# Patient Record
Sex: Female | Born: 1975 | Race: Black or African American | Hispanic: No | Marital: Single | Smoking: Never smoker
Health system: Southern US, Community
[De-identification: ages and names within clinical notes are randomized; demographics above are authoritative.]

## PROBLEM LIST (undated history)

## (undated) DIAGNOSIS — Z940 Kidney transplant status: Secondary | ICD-10-CM

## (undated) DIAGNOSIS — N12 Tubulo-interstitial nephritis, not specified as acute or chronic: Secondary | ICD-10-CM

## (undated) DIAGNOSIS — M1990 Unspecified osteoarthritis, unspecified site: Secondary | ICD-10-CM

## (undated) DIAGNOSIS — I10 Essential (primary) hypertension: Secondary | ICD-10-CM

## (undated) DIAGNOSIS — E785 Hyperlipidemia, unspecified: Secondary | ICD-10-CM

## (undated) DIAGNOSIS — N19 Unspecified kidney failure: Secondary | ICD-10-CM

## (undated) HISTORY — DX: Hyperlipidemia, unspecified: E78.5

## (undated) HISTORY — PX: PERITONEAL CATHETER INSERTION: SHX2223

## (undated) HISTORY — DX: Essential (primary) hypertension: I10

---

## 1998-06-25 ENCOUNTER — Encounter: Admission: RE | Admit: 1998-06-25 | Discharge: 1998-09-23 | Payer: Self-pay | Admitting: Obstetrics & Gynecology

## 1998-07-17 ENCOUNTER — Ambulatory Visit (HOSPITAL_COMMUNITY): Admission: RE | Admit: 1998-07-17 | Discharge: 1998-07-17 | Payer: Self-pay | Admitting: Obstetrics & Gynecology

## 1998-07-23 ENCOUNTER — Encounter: Admission: RE | Admit: 1998-07-23 | Discharge: 1998-07-23 | Payer: Self-pay | Admitting: Obstetrics & Gynecology

## 1998-07-30 ENCOUNTER — Encounter: Admission: RE | Admit: 1998-07-30 | Discharge: 1998-07-30 | Payer: Self-pay | Admitting: Obstetrics

## 1998-08-06 ENCOUNTER — Encounter: Payer: Self-pay | Admitting: Obstetrics & Gynecology

## 1998-08-06 ENCOUNTER — Encounter: Admission: RE | Admit: 1998-08-06 | Discharge: 1998-08-06 | Payer: Self-pay | Admitting: Obstetrics & Gynecology

## 1998-08-13 ENCOUNTER — Encounter: Admission: RE | Admit: 1998-08-13 | Discharge: 1998-08-13 | Payer: Self-pay | Admitting: Obstetrics & Gynecology

## 1998-08-14 ENCOUNTER — Encounter: Admission: RE | Admit: 1998-08-14 | Discharge: 1998-08-14 | Payer: Self-pay | Admitting: Obstetrics

## 1998-08-14 ENCOUNTER — Ambulatory Visit (HOSPITAL_COMMUNITY): Admission: RE | Admit: 1998-08-14 | Discharge: 1998-08-14 | Payer: Self-pay | Admitting: Obstetrics

## 1998-08-20 ENCOUNTER — Encounter: Admission: RE | Admit: 1998-08-20 | Discharge: 1998-08-20 | Payer: Self-pay | Admitting: Obstetrics & Gynecology

## 1998-08-20 ENCOUNTER — Encounter: Payer: Self-pay | Admitting: Obstetrics

## 1998-08-25 ENCOUNTER — Encounter: Admission: RE | Admit: 1998-08-25 | Discharge: 1998-08-25 | Payer: Self-pay | Admitting: Obstetrics & Gynecology

## 1998-08-27 ENCOUNTER — Encounter: Admission: RE | Admit: 1998-08-27 | Discharge: 1998-08-27 | Payer: Self-pay | Admitting: Obstetrics & Gynecology

## 1998-09-03 ENCOUNTER — Encounter: Admission: RE | Admit: 1998-09-03 | Discharge: 1998-09-03 | Payer: Self-pay | Admitting: Obstetrics & Gynecology

## 1998-09-04 ENCOUNTER — Ambulatory Visit (HOSPITAL_COMMUNITY): Admission: RE | Admit: 1998-09-04 | Discharge: 1998-09-04 | Payer: Self-pay | Admitting: *Deleted

## 1998-09-10 ENCOUNTER — Encounter: Admission: RE | Admit: 1998-09-10 | Discharge: 1998-09-10 | Payer: Self-pay | Admitting: Obstetrics & Gynecology

## 1998-09-17 ENCOUNTER — Encounter: Admission: RE | Admit: 1998-09-17 | Discharge: 1998-09-17 | Payer: Self-pay | Admitting: Obstetrics & Gynecology

## 1998-10-01 ENCOUNTER — Encounter: Admission: RE | Admit: 1998-10-01 | Discharge: 1998-10-01 | Payer: Self-pay | Admitting: Obstetrics & Gynecology

## 1998-10-04 HISTORY — PX: TUBAL LIGATION: SHX77

## 1998-10-15 ENCOUNTER — Ambulatory Visit (HOSPITAL_COMMUNITY): Admission: RE | Admit: 1998-10-15 | Discharge: 1998-10-15 | Payer: Self-pay | Admitting: Obstetrics & Gynecology

## 1998-10-15 ENCOUNTER — Encounter: Admission: RE | Admit: 1998-10-15 | Discharge: 1998-10-15 | Payer: Self-pay | Admitting: Obstetrics & Gynecology

## 1998-10-27 ENCOUNTER — Inpatient Hospital Stay (HOSPITAL_COMMUNITY): Admission: AD | Admit: 1998-10-27 | Discharge: 1998-10-31 | Payer: Self-pay | Admitting: Obstetrics

## 1998-11-03 ENCOUNTER — Encounter (HOSPITAL_COMMUNITY): Admission: RE | Admit: 1998-11-03 | Discharge: 1999-01-15 | Payer: Self-pay | Admitting: Obstetrics & Gynecology

## 1998-11-05 ENCOUNTER — Encounter: Admission: RE | Admit: 1998-11-05 | Discharge: 1998-11-05 | Payer: Self-pay | Admitting: Obstetrics & Gynecology

## 1998-11-19 ENCOUNTER — Encounter: Admission: RE | Admit: 1998-11-19 | Discharge: 1998-11-19 | Payer: Self-pay | Admitting: Obstetrics & Gynecology

## 1998-11-26 ENCOUNTER — Inpatient Hospital Stay (HOSPITAL_COMMUNITY): Admission: AD | Admit: 1998-11-26 | Discharge: 1998-11-26 | Payer: Self-pay | Admitting: Obstetrics

## 1998-11-26 ENCOUNTER — Encounter: Admission: RE | Admit: 1998-11-26 | Discharge: 1998-11-26 | Payer: Self-pay | Admitting: Obstetrics & Gynecology

## 1998-11-26 ENCOUNTER — Encounter: Payer: Self-pay | Admitting: Obstetrics

## 1998-12-03 ENCOUNTER — Inpatient Hospital Stay (HOSPITAL_COMMUNITY): Admission: AD | Admit: 1998-12-03 | Discharge: 1998-12-05 | Payer: Self-pay | Admitting: Obstetrics

## 1998-12-03 ENCOUNTER — Encounter: Payer: Self-pay | Admitting: Obstetrics

## 1998-12-23 ENCOUNTER — Inpatient Hospital Stay (HOSPITAL_COMMUNITY): Admission: AD | Admit: 1998-12-23 | Discharge: 1998-12-23 | Payer: Self-pay | Admitting: *Deleted

## 1998-12-25 ENCOUNTER — Encounter: Payer: Self-pay | Admitting: Obstetrics & Gynecology

## 1999-01-03 ENCOUNTER — Inpatient Hospital Stay (HOSPITAL_COMMUNITY): Admission: AD | Admit: 1999-01-03 | Discharge: 1999-01-03 | Payer: Self-pay | Admitting: Obstetrics

## 1999-01-15 ENCOUNTER — Inpatient Hospital Stay (HOSPITAL_COMMUNITY): Admission: AD | Admit: 1999-01-15 | Discharge: 1999-01-19 | Payer: Self-pay | Admitting: Obstetrics & Gynecology

## 1999-01-20 ENCOUNTER — Encounter (HOSPITAL_COMMUNITY): Admission: RE | Admit: 1999-01-20 | Discharge: 1999-04-20 | Payer: Self-pay | Admitting: *Deleted

## 1999-05-22 ENCOUNTER — Ambulatory Visit (HOSPITAL_COMMUNITY): Admission: RE | Admit: 1999-05-22 | Discharge: 1999-05-23 | Payer: Self-pay | Admitting: Nephrology

## 2001-07-27 ENCOUNTER — Inpatient Hospital Stay (HOSPITAL_COMMUNITY): Admission: AD | Admit: 2001-07-27 | Discharge: 2001-07-30 | Payer: Self-pay | Admitting: Internal Medicine

## 2001-07-27 ENCOUNTER — Encounter: Payer: Self-pay | Admitting: Internal Medicine

## 2001-07-28 ENCOUNTER — Encounter: Payer: Self-pay | Admitting: Internal Medicine

## 2001-08-10 ENCOUNTER — Encounter: Admission: RE | Admit: 2001-08-10 | Discharge: 2001-11-08 | Payer: Self-pay | Admitting: Internal Medicine

## 2003-04-20 ENCOUNTER — Emergency Department (HOSPITAL_COMMUNITY): Admission: EM | Admit: 2003-04-20 | Discharge: 2003-04-20 | Payer: Self-pay | Admitting: Emergency Medicine

## 2003-04-20 ENCOUNTER — Inpatient Hospital Stay (HOSPITAL_COMMUNITY): Admission: AD | Admit: 2003-04-20 | Discharge: 2003-04-22 | Payer: Self-pay | Admitting: Nephrology

## 2003-04-20 ENCOUNTER — Encounter: Payer: Self-pay | Admitting: Emergency Medicine

## 2003-04-21 ENCOUNTER — Encounter: Payer: Self-pay | Admitting: Nephrology

## 2003-06-25 ENCOUNTER — Emergency Department (HOSPITAL_COMMUNITY): Admission: EM | Admit: 2003-06-25 | Discharge: 2003-06-25 | Payer: Self-pay | Admitting: Emergency Medicine

## 2003-06-25 ENCOUNTER — Encounter: Payer: Self-pay | Admitting: Emergency Medicine

## 2003-06-25 ENCOUNTER — Inpatient Hospital Stay (HOSPITAL_COMMUNITY): Admission: EM | Admit: 2003-06-25 | Discharge: 2003-06-28 | Payer: Self-pay | Admitting: Emergency Medicine

## 2003-06-26 ENCOUNTER — Encounter: Payer: Self-pay | Admitting: Family Medicine

## 2003-07-29 ENCOUNTER — Encounter: Payer: Self-pay | Admitting: Internal Medicine

## 2003-07-29 ENCOUNTER — Inpatient Hospital Stay (HOSPITAL_COMMUNITY): Admission: EM | Admit: 2003-07-29 | Discharge: 2003-08-01 | Payer: Self-pay | Admitting: Internal Medicine

## 2003-08-27 ENCOUNTER — Ambulatory Visit (HOSPITAL_COMMUNITY): Admission: RE | Admit: 2003-08-27 | Discharge: 2003-08-27 | Payer: Self-pay | Admitting: Internal Medicine

## 2003-09-01 ENCOUNTER — Inpatient Hospital Stay (HOSPITAL_COMMUNITY): Admission: EM | Admit: 2003-09-01 | Discharge: 2003-09-04 | Payer: Self-pay | Admitting: Emergency Medicine

## 2003-09-16 ENCOUNTER — Ambulatory Visit (HOSPITAL_COMMUNITY): Admission: RE | Admit: 2003-09-16 | Discharge: 2003-09-16 | Payer: Self-pay | Admitting: Internal Medicine

## 2003-11-11 ENCOUNTER — Emergency Department (HOSPITAL_COMMUNITY): Admission: EM | Admit: 2003-11-11 | Discharge: 2003-11-11 | Payer: Self-pay | Admitting: Emergency Medicine

## 2004-01-06 ENCOUNTER — Emergency Department (HOSPITAL_COMMUNITY): Admission: EM | Admit: 2004-01-06 | Discharge: 2004-01-06 | Payer: Self-pay | Admitting: Emergency Medicine

## 2004-01-25 ENCOUNTER — Emergency Department (HOSPITAL_COMMUNITY): Admission: EM | Admit: 2004-01-25 | Discharge: 2004-01-26 | Payer: Self-pay | Admitting: *Deleted

## 2004-04-26 ENCOUNTER — Emergency Department (HOSPITAL_COMMUNITY): Admission: EM | Admit: 2004-04-26 | Discharge: 2004-04-26 | Payer: Self-pay | Admitting: Emergency Medicine

## 2007-10-02 ENCOUNTER — Emergency Department (HOSPITAL_COMMUNITY): Admission: EM | Admit: 2007-10-02 | Discharge: 2007-10-02 | Payer: Self-pay | Admitting: Emergency Medicine

## 2007-10-03 ENCOUNTER — Ambulatory Visit: Payer: Self-pay | Admitting: Internal Medicine

## 2007-10-03 DIAGNOSIS — G609 Hereditary and idiopathic neuropathy, unspecified: Secondary | ICD-10-CM | POA: Insufficient documentation

## 2007-10-03 DIAGNOSIS — E1122 Type 2 diabetes mellitus with diabetic chronic kidney disease: Secondary | ICD-10-CM | POA: Insufficient documentation

## 2007-10-03 DIAGNOSIS — I10 Essential (primary) hypertension: Secondary | ICD-10-CM | POA: Insufficient documentation

## 2007-10-03 DIAGNOSIS — N039 Chronic nephritic syndrome with unspecified morphologic changes: Secondary | ICD-10-CM

## 2007-10-03 DIAGNOSIS — E119 Type 2 diabetes mellitus without complications: Secondary | ICD-10-CM

## 2007-10-03 DIAGNOSIS — D631 Anemia in chronic kidney disease: Secondary | ICD-10-CM | POA: Insufficient documentation

## 2007-10-03 DIAGNOSIS — R110 Nausea: Secondary | ICD-10-CM | POA: Insufficient documentation

## 2007-10-03 DIAGNOSIS — K279 Peptic ulcer, site unspecified, unspecified as acute or chronic, without hemorrhage or perforation: Secondary | ICD-10-CM | POA: Insufficient documentation

## 2007-10-03 DIAGNOSIS — K219 Gastro-esophageal reflux disease without esophagitis: Secondary | ICD-10-CM | POA: Insufficient documentation

## 2007-11-01 ENCOUNTER — Ambulatory Visit: Payer: Self-pay | Admitting: Internal Medicine

## 2007-11-01 DIAGNOSIS — R112 Nausea with vomiting, unspecified: Secondary | ICD-10-CM | POA: Insufficient documentation

## 2007-11-01 LAB — CONVERTED CEMR LAB
Albumin: 2.2 g/dL — ABNORMAL LOW (ref 3.5–5.2)
BUN: 18 mg/dL (ref 6–23)
CO2: 29 meq/L (ref 19–32)
Calcium: 8.1 mg/dL — ABNORMAL LOW (ref 8.4–10.5)
Chloride: 91 meq/L — ABNORMAL LOW (ref 96–112)
Glucose, Bld: 574 mg/dL (ref 70–99)
Lymphocytes Relative: 24 % (ref 12–46)
Lymphs Abs: 1.7 10*3/uL (ref 0.7–4.0)
MCV: 95.2 fL (ref 78.0–100.0)
Monocytes Relative: 8 % (ref 3–12)
Neutrophils Relative %: 65 % (ref 43–77)
Platelets: 380 10*3/uL (ref 150–400)
Potassium: 4.1 meq/L (ref 3.5–5.3)
RBC: 3.03 M/uL — ABNORMAL LOW (ref 3.87–5.11)
Sodium: 128 meq/L — ABNORMAL LOW (ref 135–145)
Total Protein: 5.4 g/dL — ABNORMAL LOW (ref 6.0–8.3)
WBC: 6.9 10*3/uL (ref 4.0–10.5)

## 2007-11-08 ENCOUNTER — Telehealth (INDEPENDENT_AMBULATORY_CARE_PROVIDER_SITE_OTHER): Payer: Self-pay | Admitting: Internal Medicine

## 2007-11-22 ENCOUNTER — Ambulatory Visit: Payer: Self-pay | Admitting: Endocrinology

## 2007-11-22 DIAGNOSIS — R252 Cramp and spasm: Secondary | ICD-10-CM | POA: Insufficient documentation

## 2007-11-22 DIAGNOSIS — K3184 Gastroparesis: Secondary | ICD-10-CM | POA: Insufficient documentation

## 2007-11-23 ENCOUNTER — Encounter: Payer: Self-pay | Admitting: Endocrinology

## 2007-12-13 ENCOUNTER — Ambulatory Visit: Payer: Self-pay | Admitting: Endocrinology

## 2007-12-26 ENCOUNTER — Ambulatory Visit: Payer: Self-pay | Admitting: Internal Medicine

## 2007-12-26 DIAGNOSIS — L259 Unspecified contact dermatitis, unspecified cause: Secondary | ICD-10-CM | POA: Insufficient documentation

## 2008-01-10 ENCOUNTER — Encounter: Payer: Self-pay | Admitting: Endocrinology

## 2008-01-12 ENCOUNTER — Emergency Department (HOSPITAL_COMMUNITY): Admission: EM | Admit: 2008-01-12 | Discharge: 2008-01-12 | Payer: Self-pay | Admitting: Emergency Medicine

## 2008-01-20 ENCOUNTER — Inpatient Hospital Stay (HOSPITAL_COMMUNITY): Admission: EM | Admit: 2008-01-20 | Discharge: 2008-01-23 | Payer: Self-pay | Admitting: Emergency Medicine

## 2008-02-02 ENCOUNTER — Other Ambulatory Visit: Admission: RE | Admit: 2008-02-02 | Discharge: 2008-02-02 | Payer: Self-pay | Admitting: Obstetrics and Gynecology

## 2008-03-30 ENCOUNTER — Inpatient Hospital Stay (HOSPITAL_COMMUNITY): Admission: EM | Admit: 2008-03-30 | Discharge: 2008-04-02 | Payer: Self-pay | Admitting: Emergency Medicine

## 2008-06-03 ENCOUNTER — Ambulatory Visit: Payer: Self-pay | Admitting: Internal Medicine

## 2008-06-03 DIAGNOSIS — L089 Local infection of the skin and subcutaneous tissue, unspecified: Secondary | ICD-10-CM | POA: Insufficient documentation

## 2008-07-10 ENCOUNTER — Encounter: Payer: Self-pay | Admitting: Pulmonary Disease

## 2008-07-10 ENCOUNTER — Encounter: Payer: Self-pay | Admitting: Family Medicine

## 2008-07-25 ENCOUNTER — Ambulatory Visit: Payer: Self-pay | Admitting: Internal Medicine

## 2008-07-25 DIAGNOSIS — J209 Acute bronchitis, unspecified: Secondary | ICD-10-CM | POA: Insufficient documentation

## 2008-08-13 ENCOUNTER — Inpatient Hospital Stay (HOSPITAL_COMMUNITY): Admission: EM | Admit: 2008-08-13 | Discharge: 2008-08-14 | Payer: Self-pay | Admitting: Emergency Medicine

## 2008-08-21 ENCOUNTER — Ambulatory Visit: Payer: Self-pay | Admitting: Pulmonary Disease

## 2008-08-21 DIAGNOSIS — N186 End stage renal disease: Secondary | ICD-10-CM | POA: Insufficient documentation

## 2008-08-21 DIAGNOSIS — R0602 Shortness of breath: Secondary | ICD-10-CM | POA: Insufficient documentation

## 2008-09-25 ENCOUNTER — Telehealth (INDEPENDENT_AMBULATORY_CARE_PROVIDER_SITE_OTHER): Payer: Self-pay | Admitting: *Deleted

## 2008-10-08 ENCOUNTER — Ambulatory Visit: Payer: Self-pay | Admitting: Vascular Surgery

## 2008-10-09 ENCOUNTER — Ambulatory Visit: Payer: Self-pay | Admitting: Internal Medicine

## 2008-10-09 ENCOUNTER — Encounter (INDEPENDENT_AMBULATORY_CARE_PROVIDER_SITE_OTHER): Payer: Self-pay | Admitting: Internal Medicine

## 2008-10-09 ENCOUNTER — Encounter: Payer: Self-pay | Admitting: Internal Medicine

## 2008-10-09 DIAGNOSIS — G4700 Insomnia, unspecified: Secondary | ICD-10-CM | POA: Insufficient documentation

## 2008-10-09 LAB — CONVERTED CEMR LAB
Blood Glucose, Fingerstick: 356
Hgb A1c MFr Bld: 9.5 %

## 2008-10-15 ENCOUNTER — Telehealth (INDEPENDENT_AMBULATORY_CARE_PROVIDER_SITE_OTHER): Payer: Self-pay | Admitting: Internal Medicine

## 2008-11-13 ENCOUNTER — Ambulatory Visit: Payer: Self-pay | Admitting: Internal Medicine

## 2008-11-15 ENCOUNTER — Telehealth (INDEPENDENT_AMBULATORY_CARE_PROVIDER_SITE_OTHER): Payer: Self-pay | Admitting: Internal Medicine

## 2008-11-29 ENCOUNTER — Ambulatory Visit: Payer: Self-pay | Admitting: Internal Medicine

## 2008-11-29 LAB — CONVERTED CEMR LAB: Blood Glucose, Fingerstick: 187

## 2008-12-11 ENCOUNTER — Telehealth (INDEPENDENT_AMBULATORY_CARE_PROVIDER_SITE_OTHER): Payer: Self-pay | Admitting: Internal Medicine

## 2008-12-17 ENCOUNTER — Encounter (INDEPENDENT_AMBULATORY_CARE_PROVIDER_SITE_OTHER): Payer: Self-pay | Admitting: Internal Medicine

## 2009-01-08 ENCOUNTER — Encounter (INDEPENDENT_AMBULATORY_CARE_PROVIDER_SITE_OTHER): Payer: Self-pay | Admitting: Internal Medicine

## 2009-01-16 ENCOUNTER — Ambulatory Visit: Payer: Self-pay | Admitting: Internal Medicine

## 2009-01-16 DIAGNOSIS — R142 Eructation: Secondary | ICD-10-CM

## 2009-01-16 DIAGNOSIS — J309 Allergic rhinitis, unspecified: Secondary | ICD-10-CM | POA: Insufficient documentation

## 2009-01-16 DIAGNOSIS — R141 Gas pain: Secondary | ICD-10-CM | POA: Insufficient documentation

## 2009-01-16 DIAGNOSIS — R143 Flatulence: Secondary | ICD-10-CM

## 2009-01-24 ENCOUNTER — Ambulatory Visit: Payer: Self-pay | Admitting: Gastroenterology

## 2009-01-24 DIAGNOSIS — R197 Diarrhea, unspecified: Secondary | ICD-10-CM | POA: Insufficient documentation

## 2009-01-27 ENCOUNTER — Encounter: Payer: Self-pay | Admitting: Gastroenterology

## 2009-01-27 ENCOUNTER — Telehealth (INDEPENDENT_AMBULATORY_CARE_PROVIDER_SITE_OTHER): Payer: Self-pay | Admitting: Internal Medicine

## 2009-01-27 DIAGNOSIS — K3189 Other diseases of stomach and duodenum: Secondary | ICD-10-CM | POA: Insufficient documentation

## 2009-01-27 DIAGNOSIS — R1013 Epigastric pain: Secondary | ICD-10-CM

## 2009-01-28 ENCOUNTER — Encounter: Payer: Self-pay | Admitting: Gastroenterology

## 2009-02-03 ENCOUNTER — Encounter: Payer: Self-pay | Admitting: Gastroenterology

## 2009-02-03 ENCOUNTER — Ambulatory Visit (HOSPITAL_COMMUNITY): Admission: RE | Admit: 2009-02-03 | Discharge: 2009-02-03 | Payer: Self-pay | Admitting: Gastroenterology

## 2009-02-03 ENCOUNTER — Ambulatory Visit: Payer: Self-pay | Admitting: Gastroenterology

## 2009-02-06 ENCOUNTER — Encounter: Payer: Self-pay | Admitting: Gastroenterology

## 2009-02-10 ENCOUNTER — Ambulatory Visit (HOSPITAL_COMMUNITY): Admission: RE | Admit: 2009-02-10 | Discharge: 2009-02-10 | Payer: Self-pay | Admitting: Gastroenterology

## 2009-02-10 ENCOUNTER — Ambulatory Visit: Payer: Self-pay | Admitting: Gastroenterology

## 2009-02-21 ENCOUNTER — Encounter: Payer: Self-pay | Admitting: Gastroenterology

## 2009-03-07 ENCOUNTER — Other Ambulatory Visit: Admission: RE | Admit: 2009-03-07 | Discharge: 2009-03-07 | Payer: Self-pay | Admitting: Obstetrics & Gynecology

## 2009-03-14 ENCOUNTER — Telehealth (INDEPENDENT_AMBULATORY_CARE_PROVIDER_SITE_OTHER): Payer: Self-pay

## 2009-03-14 ENCOUNTER — Telehealth: Payer: Self-pay | Admitting: Gastroenterology

## 2009-03-24 ENCOUNTER — Ambulatory Visit: Payer: Self-pay | Admitting: Internal Medicine

## 2009-04-14 ENCOUNTER — Ambulatory Visit: Payer: Self-pay | Admitting: Internal Medicine

## 2009-04-14 DIAGNOSIS — L0231 Cutaneous abscess of buttock: Secondary | ICD-10-CM | POA: Insufficient documentation

## 2009-04-14 DIAGNOSIS — L03317 Cellulitis of buttock: Secondary | ICD-10-CM

## 2009-04-15 ENCOUNTER — Ambulatory Visit: Payer: Self-pay | Admitting: Internal Medicine

## 2009-04-15 ENCOUNTER — Telehealth (INDEPENDENT_AMBULATORY_CARE_PROVIDER_SITE_OTHER): Payer: Self-pay | Admitting: Internal Medicine

## 2009-04-16 ENCOUNTER — Inpatient Hospital Stay (HOSPITAL_COMMUNITY): Admission: EM | Admit: 2009-04-16 | Discharge: 2009-04-26 | Payer: Self-pay | Admitting: Emergency Medicine

## 2009-04-17 ENCOUNTER — Ambulatory Visit: Payer: Self-pay | Admitting: Pulmonary Disease

## 2009-04-17 ENCOUNTER — Encounter (INDEPENDENT_AMBULATORY_CARE_PROVIDER_SITE_OTHER): Payer: Self-pay | Admitting: Nephrology

## 2009-04-21 ENCOUNTER — Encounter: Payer: Self-pay | Admitting: Gastroenterology

## 2009-04-30 ENCOUNTER — Encounter (INDEPENDENT_AMBULATORY_CARE_PROVIDER_SITE_OTHER): Payer: Self-pay | Admitting: Internal Medicine

## 2009-05-01 ENCOUNTER — Ambulatory Visit: Payer: Self-pay | Admitting: *Deleted

## 2009-05-04 HISTORY — PX: OTHER SURGICAL HISTORY: SHX169

## 2009-05-08 ENCOUNTER — Ambulatory Visit (HOSPITAL_COMMUNITY): Admission: RE | Admit: 2009-05-08 | Discharge: 2009-05-08 | Payer: Self-pay | Admitting: Vascular Surgery

## 2009-05-08 ENCOUNTER — Ambulatory Visit: Payer: Self-pay | Admitting: Vascular Surgery

## 2009-05-13 ENCOUNTER — Telehealth (INDEPENDENT_AMBULATORY_CARE_PROVIDER_SITE_OTHER): Payer: Self-pay | Admitting: Internal Medicine

## 2009-05-15 ENCOUNTER — Ambulatory Visit (HOSPITAL_COMMUNITY): Admission: RE | Admit: 2009-05-15 | Discharge: 2009-05-15 | Payer: Self-pay | Admitting: General Surgery

## 2009-05-16 ENCOUNTER — Encounter (INDEPENDENT_AMBULATORY_CARE_PROVIDER_SITE_OTHER): Payer: Self-pay | Admitting: Internal Medicine

## 2009-05-21 ENCOUNTER — Ambulatory Visit: Payer: Self-pay | Admitting: Internal Medicine

## 2009-05-29 ENCOUNTER — Ambulatory Visit: Payer: Self-pay | Admitting: Vascular Surgery

## 2009-06-24 ENCOUNTER — Emergency Department (HOSPITAL_COMMUNITY): Admission: EM | Admit: 2009-06-24 | Discharge: 2009-06-24 | Payer: Self-pay | Admitting: Emergency Medicine

## 2009-06-24 ENCOUNTER — Ambulatory Visit (HOSPITAL_COMMUNITY): Admission: RE | Admit: 2009-06-24 | Discharge: 2009-06-24 | Payer: Self-pay | Admitting: Nephrology

## 2009-08-26 ENCOUNTER — Inpatient Hospital Stay (HOSPITAL_COMMUNITY): Admission: EM | Admit: 2009-08-26 | Discharge: 2009-08-27 | Payer: Self-pay | Admitting: Emergency Medicine

## 2009-08-31 ENCOUNTER — Inpatient Hospital Stay (HOSPITAL_COMMUNITY): Admission: EM | Admit: 2009-08-31 | Discharge: 2009-09-04 | Payer: Self-pay | Admitting: Emergency Medicine

## 2009-09-01 ENCOUNTER — Encounter (INDEPENDENT_AMBULATORY_CARE_PROVIDER_SITE_OTHER): Payer: Self-pay | Admitting: Internal Medicine

## 2009-09-18 ENCOUNTER — Observation Stay (HOSPITAL_COMMUNITY): Admission: EM | Admit: 2009-09-18 | Discharge: 2009-09-19 | Payer: Self-pay | Admitting: Emergency Medicine

## 2009-09-24 ENCOUNTER — Encounter: Payer: Self-pay | Admitting: Gastroenterology

## 2009-10-08 ENCOUNTER — Ambulatory Visit: Payer: Self-pay | Admitting: Gastroenterology

## 2009-10-08 DIAGNOSIS — K921 Melena: Secondary | ICD-10-CM | POA: Insufficient documentation

## 2009-10-10 ENCOUNTER — Encounter (INDEPENDENT_AMBULATORY_CARE_PROVIDER_SITE_OTHER): Payer: Self-pay

## 2009-10-19 ENCOUNTER — Inpatient Hospital Stay (HOSPITAL_COMMUNITY): Admission: EM | Admit: 2009-10-19 | Discharge: 2009-10-21 | Payer: Self-pay | Admitting: Emergency Medicine

## 2009-10-20 ENCOUNTER — Encounter: Payer: Self-pay | Admitting: Gastroenterology

## 2009-10-20 ENCOUNTER — Encounter (INDEPENDENT_AMBULATORY_CARE_PROVIDER_SITE_OTHER): Payer: Self-pay | Admitting: Internal Medicine

## 2009-11-03 ENCOUNTER — Ambulatory Visit (HOSPITAL_COMMUNITY): Admission: RE | Admit: 2009-11-03 | Discharge: 2009-11-03 | Payer: Self-pay | Admitting: Obstetrics and Gynecology

## 2009-11-18 ENCOUNTER — Ambulatory Visit: Payer: Self-pay | Admitting: Gastroenterology

## 2009-11-18 DIAGNOSIS — R188 Other ascites: Secondary | ICD-10-CM | POA: Insufficient documentation

## 2009-11-19 ENCOUNTER — Encounter: Payer: Self-pay | Admitting: Gastroenterology

## 2009-11-19 ENCOUNTER — Encounter (HOSPITAL_COMMUNITY): Admission: RE | Admit: 2009-11-19 | Discharge: 2009-12-19 | Payer: Self-pay | Admitting: Gastroenterology

## 2009-12-03 ENCOUNTER — Ambulatory Visit (HOSPITAL_COMMUNITY): Admission: RE | Admit: 2009-12-03 | Discharge: 2009-12-03 | Payer: Self-pay | Admitting: Nephrology

## 2009-12-04 ENCOUNTER — Ambulatory Visit: Admission: RE | Admit: 2009-12-04 | Discharge: 2009-12-04 | Payer: Self-pay | Admitting: Gynecologic Oncology

## 2009-12-05 ENCOUNTER — Ambulatory Visit (HOSPITAL_COMMUNITY): Admission: RE | Admit: 2009-12-05 | Discharge: 2009-12-05 | Payer: Self-pay | Admitting: Vascular Surgery

## 2009-12-05 ENCOUNTER — Ambulatory Visit: Payer: Self-pay | Admitting: Vascular Surgery

## 2010-01-19 ENCOUNTER — Inpatient Hospital Stay (HOSPITAL_COMMUNITY): Admission: EM | Admit: 2010-01-19 | Discharge: 2010-01-22 | Payer: Self-pay | Admitting: Emergency Medicine

## 2010-01-20 ENCOUNTER — Encounter (INDEPENDENT_AMBULATORY_CARE_PROVIDER_SITE_OTHER): Payer: Self-pay | Admitting: Internal Medicine

## 2010-01-21 ENCOUNTER — Encounter (INDEPENDENT_AMBULATORY_CARE_PROVIDER_SITE_OTHER): Payer: Self-pay | Admitting: Internal Medicine

## 2010-01-21 ENCOUNTER — Ambulatory Visit: Payer: Self-pay | Admitting: Vascular Surgery

## 2010-02-03 ENCOUNTER — Ambulatory Visit (HOSPITAL_COMMUNITY)
Admission: RE | Admit: 2010-02-03 | Discharge: 2010-02-03 | Payer: Self-pay | Source: Home / Self Care | Admitting: *Deleted

## 2010-03-04 HISTORY — PX: KIDNEY TRANSPLANT: SHX239

## 2010-03-10 ENCOUNTER — Encounter (INDEPENDENT_AMBULATORY_CARE_PROVIDER_SITE_OTHER): Payer: Self-pay | Admitting: Surgery

## 2010-03-10 ENCOUNTER — Ambulatory Visit (HOSPITAL_COMMUNITY): Admission: RE | Admit: 2010-03-10 | Discharge: 2010-03-11 | Payer: Self-pay | Admitting: Surgery

## 2010-03-27 ENCOUNTER — Ambulatory Visit (HOSPITAL_COMMUNITY): Admission: RE | Admit: 2010-03-27 | Discharge: 2010-03-27 | Payer: Self-pay | Admitting: Nephrology

## 2010-03-30 ENCOUNTER — Encounter (INDEPENDENT_AMBULATORY_CARE_PROVIDER_SITE_OTHER): Payer: Self-pay | Admitting: *Deleted

## 2010-03-30 DIAGNOSIS — K659 Peritonitis, unspecified: Secondary | ICD-10-CM | POA: Insufficient documentation

## 2010-03-30 DIAGNOSIS — E669 Obesity, unspecified: Secondary | ICD-10-CM

## 2010-03-30 DIAGNOSIS — E6609 Other obesity due to excess calories: Secondary | ICD-10-CM | POA: Insufficient documentation

## 2010-05-07 ENCOUNTER — Encounter (INDEPENDENT_AMBULATORY_CARE_PROVIDER_SITE_OTHER): Payer: Self-pay | Admitting: *Deleted

## 2010-10-24 ENCOUNTER — Encounter: Payer: Self-pay | Admitting: Internal Medicine

## 2010-10-25 ENCOUNTER — Encounter: Payer: Self-pay | Admitting: Family Medicine

## 2010-10-25 ENCOUNTER — Encounter: Payer: Self-pay | Admitting: Nephrology

## 2010-10-26 ENCOUNTER — Encounter: Payer: Self-pay | Admitting: Internal Medicine

## 2010-11-03 NOTE — Letter (Signed)
Summary: GES ORDER  GES ORDER   Imported By: Sofie Rower 11/18/2009 15:33:47  _____________________________________________________________________  External Attachment:    Type:   Image     Comment:   External Document

## 2010-11-03 NOTE — Miscellaneous (Signed)
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 Edrick Oh Added new problem of OBESITY (ICD-278.00)

## 2010-11-03 NOTE — Letter (Signed)
Summary: Recall Office Visit  [facility]  [address]   [place], [place] [address]   Phone: [phone]  Fax: [phone]      May 07, 2010   [name] [address] [place], [place]  [address] 1976/02/05   Dear Ms. BLACKWELL,   According to our records, it is time for you to schedule a follow-up office visit with [place].   At your convenience, please call [phone] to schedule an office visit. If you have any questions, concerns, or feel that this letter is in error, we would appreciate your call.   Sincerely,    [facility] Ph: [phone]   Fax: [phone]

## 2010-11-03 NOTE — Letter (Signed)
Summary: [facility] CONFIRMATION  [facility] CON[facility]IRM[facility]   Imported By: Sofie Rower 11/19/2009 15:14:21  _____________________________________________________________________  External Attachment:    Type:   Image     Comment:   External Document

## 2010-11-03 NOTE — Assessment & Plan Note (Signed)
Summary: ASCITES/GASTROPARESIS   Visit Type:  Follow-up Visit Primary Care Provider:  NONE  Chief Complaint:  ascites.  History of Present Illness: Started HD in July due to low albumin pt not a candidate for PD. SEP 2010: PD catheter in but then took it out.  Wants to know if she can go for 2nd opinion for evaluation of recurrent ascites, BLOATING, NAUSEA, and vomiting. Eats small portions and it all comes up or she feels miserable. Last time fluid 500 ML pulled off.  Low abumin to low for PD. HD: MWF.  Nausea: had a spell Fri. Vomiting and diarrhea and got better yesterday. Today first day kept things down. Stomach feels tight a bloated. Dry weight 77.5 kgs. Up Reglan to 4x/day and had diarrhea. Now on two times a day and better.   Current Medications (verified): 1)  Lantus 100 Unit/ml  Soln (Insulin Glargine) .Marland Kitchen.. 10 Units Subcutaneously At Bedtime 2)  Insulin Syringe 31g X 5/16" 0.3 Ml Misc (Insulin Syringe-Needle U-100) .... Use Daily With Lantus 3)  Insulin Pen Needles, Any Brand, Short, Needle .... Tid 4)  Bd Insulin Syringe Ultrafine 31g X 5/16" 0.3 Ml Misc (Insulin Syringe-Needle U-100) .... Use As Directed Follow-Up Appt Is Due 5)  Freestyle Test  Strp (Glucose Blood) .... Use As Directed. Checking Sugars Twice Daily Dx:250.0 6)  Epogen 10000 Unit/ml Soln (Epoetin Alfa) .... Three Times Weekly With Dialysis 7)  Lancets 30g  Misc (Lancets) .... Use As Directed Three Times A Day 8)  Reglan 5 Mg Tabs (Metoclopramide Hcl) .... Two Times A Day 9)  Nephro-Vite .... Take 1 Tablet By Mouth Once A Day 10)  Dexilant 60 Mg Cpdr (Dexlansoprazole) .... Take 1 Tablet By Mouth Once A Day  Allergies (verified): No Known Drug Allergies  Past History:  Past Surgical History: Last updated: 10/08/2009 Caesarean section-12/1994 Tubal ligation-01/1999 peritoneal dialysis cath-01/2002 Left arm graft 8/10 PD cath removal 8/10 I+D left perineum necrotizing infection, 7/10   Family History: Last  updated: 10/08/2009 father-69-HTN mother-58-diabetes sister-37 sister-42 daughter-12-DM son-8 No FH of Colon Cancer or polyps. Family History of Inflammatory Bowel Disease:sister, crohns  Social History: Last updated: 10/08/2009 Single lives with parents and 2 living children, one son deceased at age 53 month, heart defect disabled Never Smoked Alcohol use-no Drug use-no  Past Medical History: Anemia of CKD GERD Hypertension, none since on hemodialysis Peripheral neuropathy Peptic ulcer disease, h/o End Stage Renal Disease--previously on peritoneal dialysis, hemodialysis started 7/10, on transplant list at [facility] Diabetes mellitus, type II since age 11 insomina Allergic rhinitis recurrent diarrhea--small bowel bacterial overgrowth Colonoscopy in [place], normal, 3 years EGD 5/10, Dr. Barney Drain, normal appearing, sb bx - chronic peptic duodenitis TCS/EGD 2008-GASTRIC bX NEG FOR h. PYLORI, NO MICROSCOPIC COLITIS EGD 2006: bX NEG FOR h. PYLORI GASTRITIS Ascites requiring two abd paracentesis in 11/10.  W/U most c/w exudate, treated empirically for peritonitis. *****WORKUP FOR ASCITES 2010-2011: JAN 2011-CR 9.93 BODY FLUID ANALYSIS: bfALB 1.8 TRIG 33   PROTEIN 4.1 CYTOLOGY NEG CELLCOUNT 420 Neu 36% Monos 23% Amylase 17; serum ALBUMIN: 2.5 SAAG-0.7 ALK PHOS 93 AST 9 ALT 9 T BILI 0.4---PELVIC U/S: COMPLEX LEFT OVARIAN CYST; CT A/P W/ IVC: ascites, nl liver, spleen, pancreas NOV 2010- BODY FLUID ANALYSIS: bfALB 1.9 SERUM ALB 2.4 SAAG 0.6,: CYTOLOGY NEG, INR 1.27 HBsAG NEG HCV AB NEG CA 19-9 8.9 CA-125 8.8---ECHO: NL LV, EF 55-60%, NL IVC, CHAMBERS X4, AND VALVES x4, NL PULM ARTERY PRESSURE Ascites accumulated after converting from peritoneal dialysis  to hemodialysis.  Review of Systems       Per HPI, otherwise all systems negative  AUG 2010: 212 LBS     Vital Signs:  Patient profile:   35 year old female Height:      65 inches Weight:      174.50 pounds BMI:      29.14 Temp:     99.0 degrees F oral Pulse rate:   56 / minute BP sitting:   120 / 70  (right arm) Cuff size:   regular  Vitals Entered By: Waldon Merl LPN (February 15, 624THL 2:42 PM)  Physical Exam  General:  Well developed, well nourished, no acute distress. Head:  Normocephalic and atraumatic. Eyes:  PERRLA, no icterus. Mouth:  No deformity or lesions. Neck:  Supple; no masses. Lungs:  Clear throughout to auscultation. Heart:  Regular rate and rhythm; no murmurs Abdomen:  Soft. Normal bowel sounds. Mild TTP in all 4 quadrants Mildly distended.striated, without guarding, and without rebound.    Impression & Recommendations:  Problem # 1:  ABDOMINAL BLOATING (ICD-787.3) NAUSEA AND VOMTIING LIKELY 2O TO GASTROPARESIS/?ascites. Pt cannot tolerate increased dose of Reglan. Wants referral for 2nd opinion. Referred to [facility]. G[facility] ASAP. OPV in 6 mos: E:30 visit. Strictly adhere to diabetic/dastroparesis diet.  Orders: Est. Patient Level V [id number]  ADDENDUM 09811: [facility] ON Regaln BID,  Nov 19, 2009-100% RETENTION AT 60 MINUTES  Problem # 2:  OTHER ASCITES (ICD-789.59) Pt has had an extensive GI W/U for ascites. SAAG  0.7 suggests pt does not have portal HTN. Most likely 2o to ESRD and low albumin. Explained to pt if she needs a second opinion, would see a Nephrologist. Continue HD and explained that as albumin increase then she will have  most likely have more affective control of her ascites.   TIME SPENT WITH PATIENT AND REVIEWING RECORDS TO 2004: 1 HOUR  CC: PCP    Appended Document: ASCITES/GASTROPARESIS DEC 2010: ALB 3.3 T PROT 7.1 HB 9.7-->7.7 CR 9.2  ALK PHOS 88 AST 8   TIBC 169 L, HEME NEGX3  OCT 2010: FERRITIN 272

## 2010-11-03 NOTE — Miscellaneous (Signed)
Summary: Orders Update  Clinical Lists Changes  Orders: Added new Test order of T-Hemoglobin and Hematocrit (1005) - Signed  Appended Document: Orders Update H/H from inpatient sufficient. No need to repeat for now.  Keep OV with [facility] as planned.  Appended Document: Orders Update pt aware

## 2010-11-03 NOTE — Letter (Signed)
Summary: [facility]ERRAL  [facility]ERRAL   Imported By: Sofie Rower 11/18/2009 16:16:40  _____________________________________________________________________  External Attachment:    Type:   Image     Comment:   External Document

## 2010-11-03 NOTE — Assessment & Plan Note (Signed)
Summary: BLOATING- CDG   Visit Type:  f/u Primary Care Provider:  None  Chief Complaint:  bloating, belching, and feels like food sticks in the top of her stomach.  History of Present Illness: [name] is a pleasant 35 year old with history of end-stage renal disease on hemodialysis, diabetes mellitus (since teenager) who presents for further evaluation of chronic abdominal bloating and belching. Her history is also notable for the fact that she underwent peritoneal dialysis for years, and this was stopped around July or August of this past year secondary to hypoalbuminemia. She was converted to hemodialysis. She has also had difficulties with ascites requiring paracentesis at least 2 occasions for a total of 8-9L of fluid. All cultures have remained negative at this time. She was given antibiotics for possible peritonitis however.  She has had previous episodes of peritonitis in the past several years.  She presents complaining of foul-smelling belching, postprandial vomiting, epigastric fullness and bloating associated with meals. Symptoms have been recurrent in the last couple of months. She was initially started on Reglan 4 times a day after recent hospitalization but this caused diarrhea therefore she has dropped the dose to twice a day. She denies any typical heartburn. She generally has a bowel movement once daily ranging from constipation to loose stools. Has a history of melena. She had Hemoccult-positive stool during her last hospitalization however. She states that she return in 3 Hemoccult cards this week to [facility]. She is awaiting results. Her last hemoglobin was in the 8 range.  She states she was taken off Actos during hospitalization as it was felt that this may be contributing to fluid overload. Her sugars have been running up into the 150s. She notes her menstrual cycles have been irregular, last one over 2 months ago.  Abdominal ultrasound and Doppler  studies done in November 2010 when she presented with ascites showed gallbladder sludge, no evidence of cirrhosis, no evidence of portal vein occlusion or thrombus,ascites present.     CT of abdomen and pelvis with contrast November 2010 showed no evidence of cirrhosis. Ascitic fluid cultures, gram stain were neg.  Cytology negative. AFB smear and culture prelim negative.  Current Medications (verified): 1)  Lantus 100 Unit/ml  Soln (Insulin Glargine) .Marland Kitchen.. 15 Units Subcutaneously At Bedtime 2)  Insulin Syringe 31g X 5/16" 0.3 Ml Misc (Insulin Syringe-Needle U-100) .... Use Daily With Lantus 3)  Insulin Pen Needles, Any Brand, Short, Needle .... Tid 4)  Kapidex 60 Mg Cpdr (Dexlansoprazole) .... One Tab Once Daily 5)  Bd Insulin Syringe Ultrafine 31g X 5/16" 0.3 Ml Misc (Insulin Syringe-Needle U-100) .... Use As Directed Follow-Up Appt Is Due 6)  Freestyle Test  Strp (Glucose Blood) .... Use As Directed. Checking Sugars Twice Daily Dx:250.0 7)  Epogen 10000 Unit/ml Soln (Epoetin Alfa) .... Three Times Weekly With Dialysis 8)  Lancets 30g  Misc (Lancets) .... Use As Directed Three Times A Day 9)  Reglan 5 Mg Tabs (Metoclopramide Hcl) .... Two Times A Day  Allergies (verified): No Known Drug Allergies  Past History:  Past Medical History: Anemia of CKD GERD Hypertension, none since on hemodialysis Peripheral neuropathy Peptic ulcer disease, h/o End Stage Renal Disease--previously on peritoneal dialysis, hemodialysis started 7/10, on transplant list at [facility] Diabetes mellitus, type II since age 68 insomina Allergic rhinitis recurrent diarrhea--small bowel bacterial overgrowth Colonoscopy in [place], normal, 3 years EGD 5/10, Dr. Barney Drain, normal appearing, sb bx - chronic peptic duodenitis Ascites requiring two abd paracentesis in  11/10.  W/U most c/w exudate, treated empirically for peritonitis.  Ascites accumulated after converting from peritoneal dialysis to hemodialysis.  Past  Surgical History: Caesarean section-12/1994 Tubal ligation-01/1999 peritoneal dialysis cath-01/2002 Left arm graft 8/10 PD cath removal 8/10 I+D left perineum necrotizing infection, 7/10   Family History: father-69-HTN mother-58-diabetes sister-37 sister-42 daughter-12-DM son-8 No FH of Colon Cancer or polyps. Family History of Inflammatory Bowel Disease:sister, crohns  Social History: Single lives with parents and 2 living children, one son deceased at age 79 month, heart defect disabled Never Smoked Alcohol use-no Drug use-no  Review of Systems General:  Complains of weight loss; denies fever and weakness; wt 212 8/10 down to 170 now.  s/p large volume paracentesis in 11/10. CV:  Denies chest pains, angina, and palpitations. Resp:  Denies dyspnea at rest, dyspnea with exercise, and cough. GI:  Complains of nausea, vomiting, abdominal pain, and gas/bloating; denies difficulty swallowing, pain on swallowing, indigestion/heartburn, vomiting blood, jaundice, change in bowel habits, bloody BM's, and black BMs. GU:  irregular menses, none for two months. Heme:  Denies bruising and bleeding.  Vital Signs:  Patient profile:   35 year old female Height:      65 inches Weight:      171 pounds BMI:     28.56 Temp:     98.4 degrees F oral Pulse rate:   92 / minute BP sitting:   110 / 64  (right arm) Cuff size:   regular  Vitals Entered By: Burnadette Peter LPN (January  5, 624THL 2:23 PM)  Physical Exam  General:  Well developed, well nourished, no acute distress. Head:  Normocephalic and atraumatic. Eyes:  sclera nonicteric Mouth:  op moist Lungs:  Clear throughout to auscultation. Heart:  Regular rate and rhythm; no murmurs, rubs,  or bruits. Abdomen:  obese. positive bs. nontender. no abd bruit or hernia. no fluid wave. soft. Extremities:  No clubbing, cyanosis, edema or deformities noted. Neurologic:  Alert and  oriented x4;  grossly normal neurologically. Skin:  Intact  without significant lesions or rashes. Psych:  Alert and cooperative. Normal mood and affect.  Impression & Recommendations:  Problem # 1:  ABDOMINAL BLOATING (ICD-787.3)  Abdominal bloating/fullness, postprandial nausea and vomiting several months duration.  Complicated by h/o ascites, hemoccult positive stool, anemia, DM, gb sludge.  DDX is quite broad.  Need to consider gastroparesis, gallbladder disease, less likely PUD.  No clinical evidence of recurrent ascites to explain symptoms.  She has ongoing anemia and h/o heme positive stool therefore may need repeat EGD/TCS.  Initially I will obtain copy of recent labs and hemoccults done at [facility].  She can continue Reglan two times a day as before.  Consume 5-6 small meals daily.  If symptoms worsen in meantime or she developes recurrent ascites, fever she will let us know.  Encouraged her to continue to look for PCP (difficulty obtaining PCP secondary to no one taking new Medicare patients). Further recommendations to follow.  Orders: Est. Patient Level III [id number])  Appended Document: BLOATING- CDG Retrieved records.  Hgb went from 9.7 on 09/17/09 to 7.7 on 10/01/09. Ferritin 12, alb 3.3, AP 88, AST 8, ALT 6, HbsAg neg, HbsAB >150, HCV negative.  Heme negative X 3.  To discuss with [facility].  Pt has early satiety/bloating, pp vomiting, heartburn, H/O ascites.  Heme positive during recent hospitalization though.  Last EGD 5/10. Last TCS 3 years ago.  Appended Document: BLOATING- CDG Pt needs E: 30 visit with me in  next 2 weeks to discuss Sx.  Appended Document: BLOATING- CDG Let's get an H/H on patient.  One two weeks ago was below 8.  Appended Document: BLOATING- CDG called pt- she is inpt at [facility]  Appended Document: BLOATING- CDG No need for H/H now.  Hgb in upper 8 to low 9 range inpatient.  Keep OV with [facility] as planned.  Appended Document: BLOATING- CDG pt aware  Appended Document: BLOATING- CDG LMOM TO CALL BACK

## 2010-11-03 NOTE — Miscellaneous (Signed)
Summary: procedures prior to 2008  Clinical Lists Changes  Gastric Emptying study  NM Gastric Emptying. - STATUS: Final  IMAGE                                     Perform Date: 13Dec04 09:49  Ordered By: Gala Romney MD , Cristopher Estimable           Ordered Date: 13Dec04 07:09  Facility: [facility]                               Department: [place]  Service Report Text  Accession Number: [id number]    Clinical Data:  Upper abdominal pain.   GASTRIC EMPTYING STUDY   2 mCi Tc-66m sulfur colloid cooked in one egg and administered p. o.   Percent activity remaining in the stomach at 120 minutes is 1.5 %,   normal.  Normal gastric emptying at 120 minutes is less than 30%.   IMPRESSION   Normal gastric emptying study.    Read By:  Lura Em,  M.D.   Released By:  Lura Em,  M.D.  Additional Information  External image : [id number]  EGD  NAME:  [name], [name]                      ACCOUNT NO.:  [id number]   MEDICAL RECORD NO.:  [id number]                   PATIENT TYPE:  AMB   LOCATION:  DAY                                  FACILITY:  [facility]   PHYSICIAN:  R. Garfield Cornea, M.D.              DATE OF BIRTH:  [birth year]   DATE OF PROCEDURE:  DATE OF DISCHARGE:                                 OPERATIVE REPORT   PROCEDURE:  Diagnostic esophagogastroduodenoscopy.   ENDOSCOPIST:  Cristopher Estimable. Rourk, M.D.   INDICATIONS FOR PROCEDURE:  The patient is a 35 year old lady with a recent  bout of upper abdominal pain, nausea and vomiting which precipitated  hospitalization.  Abdominal pain has resolved; however, she continues to  have intermittent postprandial nausea and vomiting.  EGD is now being done  to further evaluate her symptoms.  This approach has been discussed with the  patient at length.  The potential risks, benefits, and alternatives have  been reviewed; questions answered.  She is agreeable.  Please see my  August 26, 2003 H&P.   PROCEDURE NOTE:  O2 saturation, blood pressure, pulse  and respirations were  monitored throughout the entire procedure.  Conscious sedation: Versed 3 mg  IV, Demerol 75 mg IV in divided doses.   INSTRUMENT:  Olympus video chip adult gastroscope.   FINDINGS:  Examination of the tubular esophagus revealed no mucosal  abnormalities.  The EG junction was easily traversed.   STOMACH:  The gastric cavity was empty.  It insufflated well with air.  A  thorough examination of the gastric mucosa including a retroflex view of the  proximal stomach  and esophagogastric junction demonstrated a couple of  linear erosions along the greater curvature. The patient had a small hiatal  hernia.  Otherwise the gastric mucosa appeared normal.  The pylorus was  patent and easily traversed.   DUODENUM:  The bulb and the second portion appeared normal.   THERAPEUTIC/DIAGNOSTIC MANEUVERS:  None.   The patient tolerated the procedure well and was reacted in endoscopy.   IMPRESSION:  1. Normal esophagus.  2. Small hiatal hernia and a couple of linear gastric erosions as described     above.  The remainder of the gastric mucosa appeared normal.  3. Normal D1 and D2.   DISCUSSION:  If she continues to have nausea and vomiting we need to be  concerned about an element of gastroparesis, erosions in her stomach may be  related to the trauma of vomiting.   RECOMMENDATIONS:  1. A solid phase gastric emptying study in the near future.  2. We will check H. pylori serologies.  3. Further recommendations to follow.      ___________________________________________                                            Bridgette Habermann, M.D.   RMR/MEDQ  D:  08/27/2003  T:  08/27/2003  Job:  [id number]   cc:   Tammi Sou, M.D.  [facility]  [place], [place] [address]  Fax: [phone]

## 2010-12-15 ENCOUNTER — Emergency Department (HOSPITAL_COMMUNITY)
Admission: EM | Admit: 2010-12-15 | Discharge: 2010-12-15 | Disposition: A | Discharge status: Home / Self Care | Payer: Medicare Other | Attending: Emergency Medicine | Admitting: Emergency Medicine

## 2010-12-15 DIAGNOSIS — N189 Chronic kidney disease, unspecified: Secondary | ICD-10-CM | POA: Insufficient documentation

## 2010-12-15 DIAGNOSIS — Z992 Dependence on renal dialysis: Secondary | ICD-10-CM | POA: Insufficient documentation

## 2010-12-15 DIAGNOSIS — J029 Acute pharyngitis, unspecified: Secondary | ICD-10-CM | POA: Insufficient documentation

## 2010-12-15 DIAGNOSIS — R6883 Chills (without fever): Secondary | ICD-10-CM | POA: Insufficient documentation

## 2010-12-15 DIAGNOSIS — I129 Hypertensive chronic kidney disease with stage 1 through stage 4 chronic kidney disease, or unspecified chronic kidney disease: Secondary | ICD-10-CM | POA: Insufficient documentation

## 2010-12-15 DIAGNOSIS — Z794 Long term (current) use of insulin: Secondary | ICD-10-CM | POA: Insufficient documentation

## 2010-12-15 DIAGNOSIS — H9209 Otalgia, unspecified ear: Secondary | ICD-10-CM | POA: Insufficient documentation

## 2010-12-15 DIAGNOSIS — I10 Essential (primary) hypertension: Secondary | ICD-10-CM | POA: Insufficient documentation

## 2010-12-15 DIAGNOSIS — E119 Type 2 diabetes mellitus without complications: Secondary | ICD-10-CM | POA: Insufficient documentation

## 2010-12-20 LAB — MAGNESIUM
Magnesium: 1.9 mg/dL (ref 1.5–2.5)
Magnesium: 2 mg/dL (ref 1.5–2.5)

## 2010-12-20 LAB — BASIC METABOLIC PANEL
BUN: 11 mg/dL (ref 6–23)
CO2: 30 mEq/L (ref 19–32)
Calcium: 7.8 mg/dL — ABNORMAL LOW (ref 8.4–10.5)
Creatinine, Ser: 5.5 mg/dL — ABNORMAL HIGH (ref 0.4–1.2)
Glucose, Bld: 97 mg/dL (ref 70–99)

## 2010-12-20 LAB — DIFFERENTIAL
Eosinophils Absolute: 0.1 10*3/uL (ref 0.0–0.7)
Eosinophils Relative: 1 % (ref 0–5)
Lymphocytes Relative: 19 % (ref 12–46)
Lymphs Abs: 1.7 10*3/uL (ref 0.7–4.0)
Monocytes Absolute: 1 10*3/uL (ref 0.1–1.0)
Monocytes Relative: 12 % (ref 3–12)

## 2010-12-20 LAB — BODY FLUID CELL COUNT WITH DIFFERENTIAL: Total Nucleated Cell Count, Fluid: 420 cu mm (ref 0–1000)

## 2010-12-20 LAB — LIPASE, BLOOD: Lipase: 11 U/L (ref 11–59)

## 2010-12-20 LAB — CBC
HCT: 28.3 % — ABNORMAL LOW (ref 36.0–46.0)
Hemoglobin: 8.9 g/dL — ABNORMAL LOW (ref 12.0–15.0)
Hemoglobin: 9.1 g/dL — ABNORMAL LOW (ref 12.0–15.0)
MCHC: 31.6 g/dL (ref 30.0–36.0)
MCHC: 31.8 g/dL (ref 30.0–36.0)
MCHC: 32 g/dL (ref 30.0–36.0)
MCHC: 32.3 g/dL (ref 30.0–36.0)
MCV: 90.4 fL (ref 78.0–100.0)
Platelets: 243 10*3/uL (ref 150–400)
Platelets: 354 10*3/uL (ref 150–400)
Platelets: 372 10*3/uL (ref 150–400)
RBC: 3.1 MIL/uL — ABNORMAL LOW (ref 3.87–5.11)
RBC: 3.62 MIL/uL — ABNORMAL LOW (ref 3.87–5.11)
RDW: 17.5 % — ABNORMAL HIGH (ref 11.5–15.5)
RDW: 17.6 % — ABNORMAL HIGH (ref 11.5–15.5)
WBC: 7.6 10*3/uL (ref 4.0–10.5)
WBC: 8.8 10*3/uL (ref 4.0–10.5)

## 2010-12-20 LAB — COMPREHENSIVE METABOLIC PANEL
ALT: 11 U/L (ref 0–35)
ALT: 9 U/L (ref 0–35)
AST: 13 U/L (ref 0–37)
Albumin: 2.9 g/dL — ABNORMAL LOW (ref 3.5–5.2)
Alkaline Phosphatase: 93 U/L (ref 39–117)
BUN: 27 mg/dL — ABNORMAL HIGH (ref 6–23)
CO2: 28 mEq/L (ref 19–32)
CO2: 31 mEq/L (ref 19–32)
Calcium: 8.1 mg/dL — ABNORMAL LOW (ref 8.4–10.5)
Calcium: 8.6 mg/dL (ref 8.4–10.5)
GFR calc Af Amer: 7 mL/min — ABNORMAL LOW (ref >60)
GFR calc non Af Amer: 5 mL/min — ABNORMAL LOW (ref >60)
GFR calc non Af Amer: 6 mL/min — ABNORMAL LOW (ref >60)
Glucose, Bld: 76 mg/dL (ref 70–99)
Sodium: 136 mEq/L (ref 135–145)
Sodium: 136 mEq/L (ref 135–145)
Total Protein: 6.7 g/dL (ref 6.0–8.3)
Total Protein: 7.8 g/dL (ref 6.0–8.3)

## 2010-12-20 LAB — GLUCOSE, CAPILLARY
Glucose-Capillary: 100 mg/dL — ABNORMAL HIGH (ref 70–99)
Glucose-Capillary: 42 mg/dL — CL (ref 70–99)
Glucose-Capillary: 55 mg/dL — ABNORMAL LOW (ref 70–99)
Glucose-Capillary: 65 mg/dL — ABNORMAL LOW (ref 70–99)
Glucose-Capillary: 67 mg/dL — ABNORMAL LOW (ref 70–99)
Glucose-Capillary: 67 mg/dL — ABNORMAL LOW (ref 70–99)
Glucose-Capillary: 80 mg/dL (ref 70–99)
Glucose-Capillary: 88 mg/dL (ref 70–99)
Glucose-Capillary: 91 mg/dL (ref 70–99)

## 2010-12-20 LAB — RENAL FUNCTION PANEL
Albumin: 2.5 g/dL — ABNORMAL LOW (ref 3.5–5.2)
Calcium: 7.7 mg/dL — ABNORMAL LOW (ref 8.4–10.5)
Creatinine, Ser: 9.93 mg/dL — ABNORMAL HIGH (ref 0.4–1.2)
GFR calc Af Amer: 5 mL/min — ABNORMAL LOW (ref >60)
GFR calc non Af Amer: 4 mL/min — ABNORMAL LOW (ref >60)
Phosphorus: 4.8 mg/dL — ABNORMAL HIGH (ref 2.3–4.6)
Sodium: 132 mEq/L — ABNORMAL LOW (ref 135–145)

## 2010-12-20 LAB — AMYLASE, BODY FLUID: Amylase, Fluid: 17 U/L

## 2010-12-20 LAB — BODY FLUID CULTURE: Culture: NO GROWTH

## 2010-12-20 LAB — TRIGLYCERIDES, BODY FLUIDS: Triglycerides, Fluid: 33 mg/dL

## 2010-12-20 LAB — PHOSPHORUS: Phosphorus: 3.3 mg/dL (ref 2.3–4.6)

## 2010-12-20 LAB — PATHOLOGIST SMEAR REVIEW

## 2010-12-21 LAB — ANAEROBIC CULTURE

## 2010-12-21 LAB — BASIC METABOLIC PANEL
CO2: 31 mEq/L (ref 19–32)
CO2: 33 mEq/L — ABNORMAL HIGH (ref 19–32)
Calcium: 7.9 mg/dL — ABNORMAL LOW (ref 8.4–10.5)
Calcium: 8.8 mg/dL (ref 8.4–10.5)
Chloride: 96 mEq/L (ref 96–112)
Chloride: 99 mEq/L (ref 96–112)
Creatinine, Ser: 7.36 mg/dL — ABNORMAL HIGH (ref 0.4–1.2)
Creatinine, Ser: 8.81 mg/dL — ABNORMAL HIGH (ref 0.4–1.2)
GFR calc Af Amer: 6 mL/min — ABNORMAL LOW (ref >60)
GFR calc Af Amer: 8 mL/min — ABNORMAL LOW (ref >60)
GFR calc Af Amer: 8 mL/min — ABNORMAL LOW (ref >60)
GFR calc non Af Amer: 5 mL/min — ABNORMAL LOW (ref >60)
Sodium: 136 mEq/L (ref 135–145)
Sodium: 139 mEq/L (ref 135–145)

## 2010-12-21 LAB — FUNGUS CULTURE W SMEAR: Fungal Smear: NONE SEEN

## 2010-12-21 LAB — TISSUE CULTURE

## 2010-12-21 LAB — CBC
Hemoglobin: 11.8 g/dL — ABNORMAL LOW (ref 12.0–15.0)
MCHC: 32.1 g/dL (ref 30.0–36.0)
MCV: 93.5 fL (ref 78.0–100.0)
Platelets: 207 10*3/uL (ref 150–400)
RBC: 3.95 MIL/uL (ref 3.87–5.11)
WBC: 5.2 10*3/uL (ref 4.0–10.5)

## 2010-12-21 LAB — AFB CULTURE WITH SMEAR (NOT AT ARMC): Acid Fast Smear: NONE SEEN

## 2010-12-21 LAB — GLUCOSE, CAPILLARY
Glucose-Capillary: 53 mg/dL — ABNORMAL LOW (ref 70–99)
Glucose-Capillary: 59 mg/dL — ABNORMAL LOW (ref 70–99)
Glucose-Capillary: 78 mg/dL (ref 70–99)
Glucose-Capillary: 94 mg/dL (ref 70–99)
Glucose-Capillary: 99 mg/dL (ref 70–99)

## 2010-12-22 LAB — CBC
HCT: 22.5 % — ABNORMAL LOW (ref 36.0–46.0)
HCT: 23.4 % — ABNORMAL LOW (ref 36.0–46.0)
HCT: 25.2 % — ABNORMAL LOW (ref 36.0–46.0)
Hemoglobin: 7.9 g/dL — ABNORMAL LOW (ref 12.0–15.0)
Hemoglobin: 8.4 g/dL — ABNORMAL LOW (ref 12.0–15.0)
MCHC: 32.1 g/dL (ref 30.0–36.0)
MCHC: 32.2 g/dL (ref 30.0–36.0)
MCHC: 32.4 g/dL (ref 30.0–36.0)
MCHC: 33.1 g/dL (ref 30.0–36.0)
MCV: 92.8 fL (ref 78.0–100.0)
MCV: 93.2 fL (ref 78.0–100.0)
MCV: 94.3 fL (ref 78.0–100.0)
Platelets: 232 10*3/uL (ref 150–400)
RBC: 2.42 MIL/uL — ABNORMAL LOW (ref 3.87–5.11)
RBC: 2.48 MIL/uL — ABNORMAL LOW (ref 3.87–5.11)
RBC: 2.65 MIL/uL — ABNORMAL LOW (ref 3.87–5.11)
RDW: 19.7 % — ABNORMAL HIGH (ref 11.5–15.5)
WBC: 5.9 10*3/uL (ref 4.0–10.5)

## 2010-12-22 LAB — TYPE AND SCREEN

## 2010-12-22 LAB — COMPREHENSIVE METABOLIC PANEL
ALT: 21 U/L (ref 0–35)
Alkaline Phosphatase: 140 U/L — ABNORMAL HIGH (ref 39–117)
BUN: 34 mg/dL — ABNORMAL HIGH (ref 6–23)
CO2: 30 mEq/L (ref 19–32)
Calcium: 9 mg/dL (ref 8.4–10.5)
GFR calc non Af Amer: 5 mL/min — ABNORMAL LOW (ref >60)
GFR calc non Af Amer: 5 mL/min — ABNORMAL LOW (ref >60)
Glucose, Bld: 178 mg/dL — ABNORMAL HIGH (ref 70–99)
Glucose, Bld: 81 mg/dL (ref 70–99)
Potassium: 3.8 mEq/L (ref 3.5–5.1)
Sodium: 137 mEq/L (ref 135–145)
Total Bilirubin: 0.8 mg/dL (ref 0.3–1.2)
Total Protein: 7.7 g/dL (ref 6.0–8.3)

## 2010-12-22 LAB — GLUCOSE, CAPILLARY
Glucose-Capillary: 102 mg/dL — ABNORMAL HIGH (ref 70–99)
Glucose-Capillary: 107 mg/dL — ABNORMAL HIGH (ref 70–99)
Glucose-Capillary: 108 mg/dL — ABNORMAL HIGH (ref 70–99)
Glucose-Capillary: 117 mg/dL — ABNORMAL HIGH (ref 70–99)
Glucose-Capillary: 120 mg/dL — ABNORMAL HIGH (ref 70–99)
Glucose-Capillary: 122 mg/dL — ABNORMAL HIGH (ref 70–99)
Glucose-Capillary: 126 mg/dL — ABNORMAL HIGH (ref 70–99)
Glucose-Capillary: 136 mg/dL — ABNORMAL HIGH (ref 70–99)
Glucose-Capillary: 142 mg/dL — ABNORMAL HIGH (ref 70–99)
Glucose-Capillary: 222 mg/dL — ABNORMAL HIGH (ref 70–99)
Glucose-Capillary: 41 mg/dL — CL (ref 70–99)
Glucose-Capillary: 45 mg/dL — ABNORMAL LOW (ref 70–99)
Glucose-Capillary: 59 mg/dL — ABNORMAL LOW (ref 70–99)
Glucose-Capillary: 63 mg/dL — ABNORMAL LOW (ref 70–99)
Glucose-Capillary: 63 mg/dL — ABNORMAL LOW (ref 70–99)
Glucose-Capillary: 67 mg/dL — ABNORMAL LOW (ref 70–99)
Glucose-Capillary: 70 mg/dL (ref 70–99)
Glucose-Capillary: 74 mg/dL (ref 70–99)
Glucose-Capillary: 80 mg/dL (ref 70–99)
Glucose-Capillary: 90 mg/dL (ref 70–99)
Glucose-Capillary: 95 mg/dL (ref 70–99)

## 2010-12-22 LAB — DIFFERENTIAL
Basophils Relative: 0 % (ref 0–1)
Basophils Relative: 1 % (ref 0–1)
Eosinophils Absolute: 0.2 10*3/uL (ref 0.0–0.7)
Lymphs Abs: 2.4 10*3/uL (ref 0.7–4.0)
Monocytes Relative: 10 % (ref 3–12)
Neutro Abs: 4.3 10*3/uL (ref 1.7–7.7)
Neutrophils Relative %: 56 % (ref 43–77)
Neutrophils Relative %: 56 % (ref 43–77)

## 2010-12-22 LAB — ANTI-SMOOTH MUSCLE ANTIBODY, IGG: F-Actin IgG: <20 U (ref <20)

## 2010-12-22 LAB — BODY FLUID CELL COUNT WITH DIFFERENTIAL
Eos, Fluid: 2 %
Lymphs, Fluid: 66 %
Monocyte-Macrophage-Serous Fluid: 2 % — ABNORMAL LOW (ref 50–90)
Neutrophil Count, Fluid: 30 % — ABNORMAL HIGH (ref 0–25)
Total Nucleated Cell Count, Fluid: 635 cu mm (ref 0–1000)

## 2010-12-22 LAB — BODY FLUID CULTURE: Culture: NO GROWTH

## 2010-12-22 LAB — HEPATITIS B SURFACE ANTIBODY,QUALITATIVE: Hep B S Ab: POSITIVE — AB

## 2010-12-22 LAB — RENAL FUNCTION PANEL
BUN: 22 mg/dL (ref 6–23)
BUN: 28 mg/dL — ABNORMAL HIGH (ref 6–23)
CO2: 29 mEq/L (ref 19–32)
CO2: 31 mEq/L (ref 19–32)
Calcium: 8.1 mg/dL — ABNORMAL LOW (ref 8.4–10.5)
Calcium: 8.6 mg/dL (ref 8.4–10.5)
Chloride: 99 mEq/L (ref 96–112)
Creatinine, Ser: 10.04 mg/dL — ABNORMAL HIGH (ref 0.4–1.2)
Creatinine, Ser: 7.22 mg/dL — ABNORMAL HIGH (ref 0.4–1.2)
GFR calc Af Amer: 5 mL/min — ABNORMAL LOW (ref >60)
GFR calc Af Amer: 8 mL/min — ABNORMAL LOW (ref >60)
GFR calc non Af Amer: 4 mL/min — ABNORMAL LOW (ref >60)
GFR calc non Af Amer: 6 mL/min — ABNORMAL LOW (ref >60)

## 2010-12-22 LAB — PROTIME-INR
INR: 1.19 (ref 0.00–1.49)
Prothrombin Time: 15 seconds (ref 11.6–15.2)

## 2010-12-22 LAB — SEDIMENTATION RATE: Sed Rate: 78 mm/hr — ABNORMAL HIGH (ref 0–22)

## 2010-12-22 LAB — PROTEIN, BODY FLUID

## 2010-12-22 LAB — AFB CULTURE WITH SMEAR (NOT AT ARMC)

## 2010-12-22 LAB — HEPATITIS C ANTIBODY: HCV Ab: NEGATIVE

## 2010-12-22 LAB — MAGNESIUM: Magnesium: 2.2 mg/dL (ref 1.5–2.5)

## 2010-12-22 LAB — HEMOGLOBIN A1C
Hgb A1c MFr Bld: 4.8 % (ref <5.7)
Mean Plasma Glucose: 91 mg/dL (ref <117)

## 2010-12-22 LAB — ALBUMIN, FLUID (OTHER): Albumin, Fluid: 2.1 g/dL

## 2010-12-22 LAB — APTT: aPTT: 42 seconds — ABNORMAL HIGH (ref 24–37)

## 2010-12-22 LAB — CA 125: CA 125: 5 U/mL (ref 0.0–30.2)

## 2010-12-22 LAB — ABO/RH: ABO/RH(D): O POS

## 2010-12-22 LAB — MITOCHONDRIAL ANTIBODIES: Mitochondrial M2 Ab, IgG: NEGATIVE

## 2010-12-27 LAB — POTASSIUM: Potassium: 4.8 mEq/L (ref 3.5–5.1)

## 2010-12-27 LAB — POCT I-STAT 4, (NA,K, GLUC, HGB,HCT)
Glucose, Bld: 61 mg/dL — ABNORMAL LOW (ref 70–99)
HCT: 30 % — ABNORMAL LOW (ref 36.0–46.0)
Hemoglobin: 10.2 g/dL — ABNORMAL LOW (ref 12.0–15.0)
Potassium: 5.5 mEq/L — ABNORMAL HIGH (ref 3.5–5.1)
Sodium: 129 mEq/L — ABNORMAL LOW (ref 135–145)
Sodium: 137 mEq/L (ref 135–145)

## 2010-12-27 LAB — GLUCOSE, CAPILLARY
Glucose-Capillary: 39 mg/dL — CL (ref 70–99)
Glucose-Capillary: 45 mg/dL — ABNORMAL LOW (ref 70–99)
Glucose-Capillary: 69 mg/dL — ABNORMAL LOW (ref 70–99)
Glucose-Capillary: 84 mg/dL (ref 70–99)

## 2010-12-28 LAB — POTASSIUM: Potassium: 5.3 mEq/L — ABNORMAL HIGH (ref 3.5–5.1)

## 2010-12-31 IMAGING — CR DG CHEST 1V PORT
1 series · 1 of 1 positions shown · non-contrast
Comparison: 04/15/2009

CLINICAL DATA: Postop from peritoneal abscess.  Wheezing and
shortness of breath.

PORTABLE CHEST - 1 VIEW

[view not recorded]
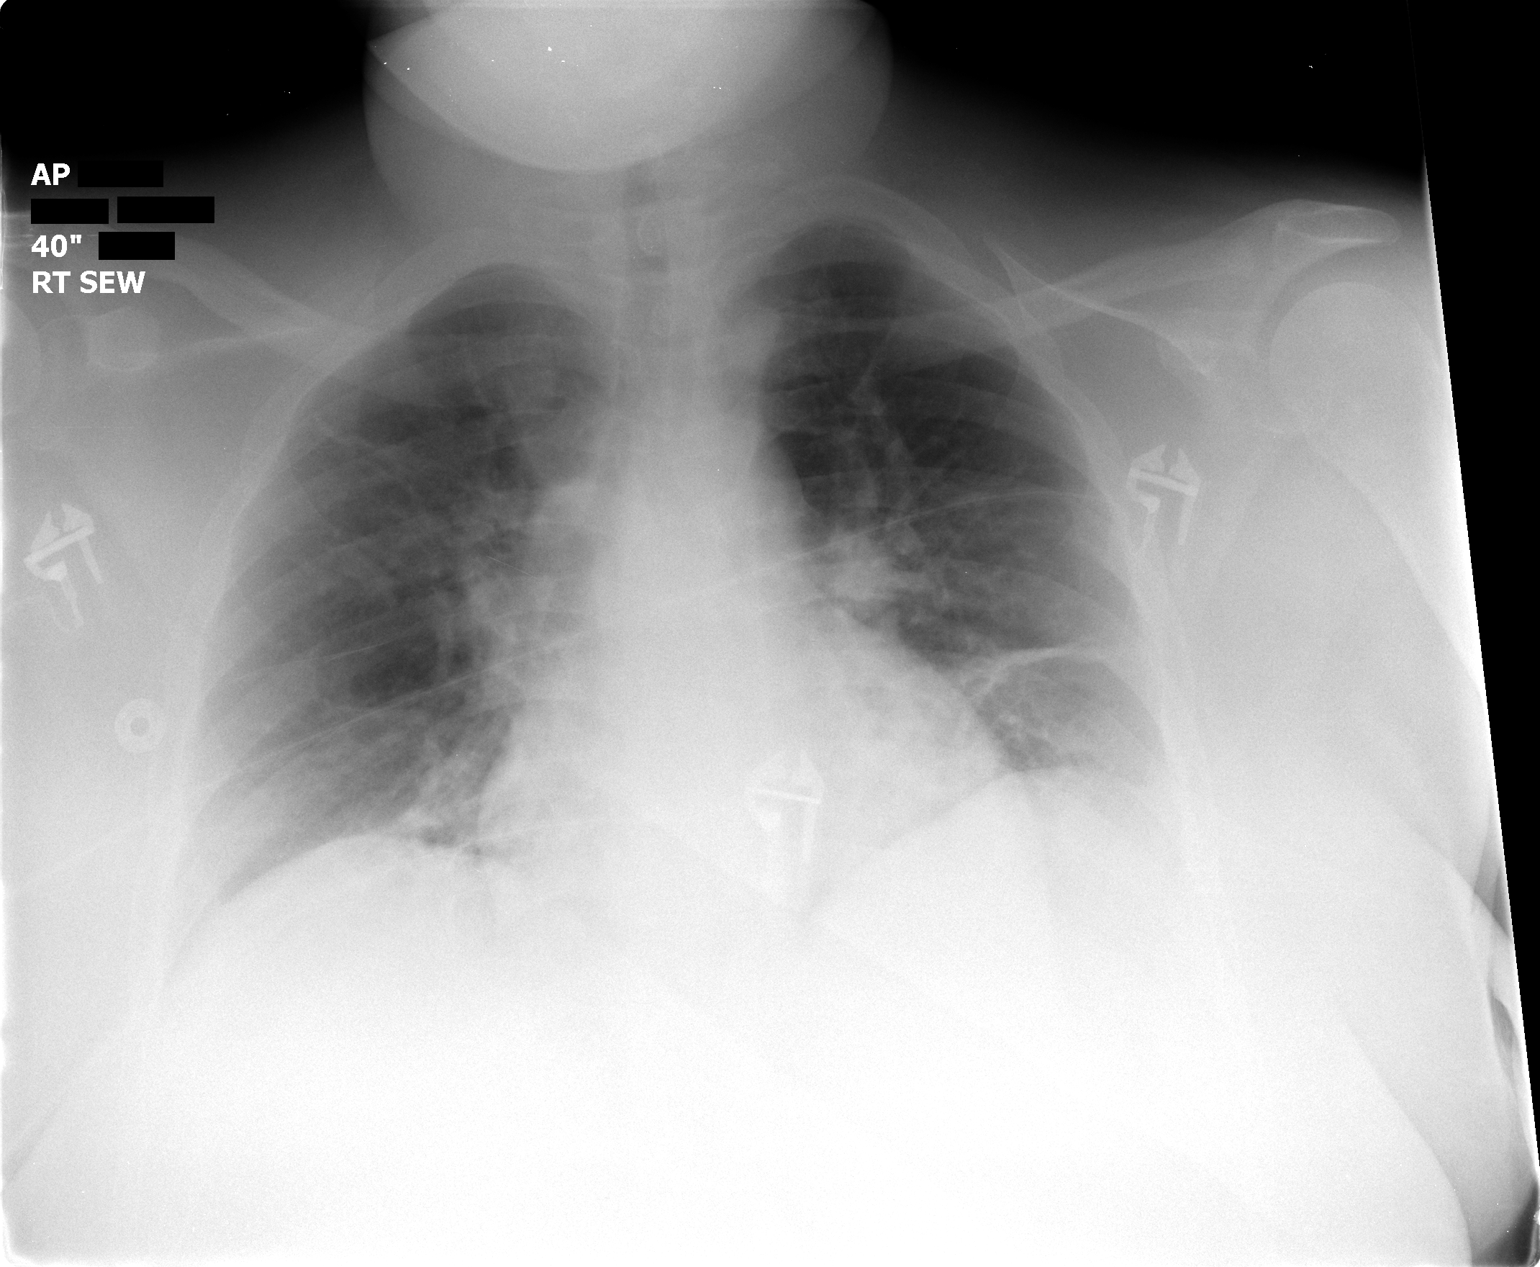

[1 of 1 positions shown; findings below may reference images not displayed]

FINDINGS: New subsegmental atelectasis is seen in both lung bases
and right upper lobe since prior study.  There is no evidence of
pulmonary consolidation or pleural effusion.  Heart size is within
normal limits.
IMPRESSION: New bibasilar and right upper lobe atelectasis.

## 2011-01-04 LAB — CK: Total CK: 15 U/L (ref 7–177)

## 2011-01-04 LAB — CBC
HCT: 32.6 % — ABNORMAL LOW (ref 36.0–46.0)
Hemoglobin: 10.2 g/dL — ABNORMAL LOW (ref 12.0–15.0)
MCHC: 31.3 g/dL (ref 30.0–36.0)
Platelets: 299 10*3/uL (ref 150–400)
RDW: 18.9 % — ABNORMAL HIGH (ref 11.5–15.5)

## 2011-01-04 LAB — COMPREHENSIVE METABOLIC PANEL
ALT: 9 U/L (ref 0–35)
AST: 11 U/L (ref 0–37)
Albumin: 2.1 g/dL — ABNORMAL LOW (ref 3.5–5.2)
Alkaline Phosphatase: 66 U/L (ref 39–117)
CO2: 31 mEq/L (ref 19–32)
Chloride: 98 mEq/L (ref 96–112)
GFR calc Af Amer: 7 mL/min — ABNORMAL LOW (ref >60)
GFR calc non Af Amer: 6 mL/min — ABNORMAL LOW (ref >60)
Potassium: 3.6 mEq/L (ref 3.5–5.1)
Sodium: 135 mEq/L (ref 135–145)
Total Bilirubin: 0.4 mg/dL (ref 0.3–1.2)

## 2011-01-04 LAB — BASIC METABOLIC PANEL
BUN: 17 mg/dL (ref 6–23)
CO2: 28 mEq/L (ref 19–32)
Calcium: 8.4 mg/dL (ref 8.4–10.5)
GFR calc non Af Amer: 8 mL/min — ABNORMAL LOW (ref >60)
Glucose, Bld: 86 mg/dL (ref 70–99)
Potassium: 4.1 mEq/L (ref 3.5–5.1)
Sodium: 138 mEq/L (ref 135–145)

## 2011-01-04 LAB — MAGNESIUM: Magnesium: 1.7 mg/dL (ref 1.5–2.5)

## 2011-01-04 LAB — DIFFERENTIAL
Basophils Absolute: 0 10*3/uL (ref 0.0–0.1)
Basophils Relative: 0 % (ref 0–1)
Eosinophils Relative: 0 % (ref 0–5)
Lymphocytes Relative: 18 % (ref 12–46)
Monocytes Absolute: 1 10*3/uL (ref 0.1–1.0)

## 2011-01-04 LAB — GLUCOSE, CAPILLARY
Glucose-Capillary: 71 mg/dL (ref 70–99)
Glucose-Capillary: 79 mg/dL (ref 70–99)

## 2011-01-05 LAB — GLUCOSE, CAPILLARY
Glucose-Capillary: 113 mg/dL — ABNORMAL HIGH (ref 70–99)
Glucose-Capillary: 126 mg/dL — ABNORMAL HIGH (ref 70–99)

## 2011-01-05 LAB — CBC
Hemoglobin: 9.3 g/dL — ABNORMAL LOW (ref 12.0–15.0)
MCHC: 32.9 g/dL (ref 30.0–36.0)
Platelets: 371 10*3/uL (ref 150–400)
RDW: 18.4 % — ABNORMAL HIGH (ref 11.5–15.5)

## 2011-01-05 LAB — RENAL FUNCTION PANEL
Albumin: 2.2 g/dL — ABNORMAL LOW (ref 3.5–5.2)
BUN: 17 mg/dL (ref 6–23)
Calcium: 8.6 mg/dL (ref 8.4–10.5)
Glucose, Bld: 128 mg/dL — ABNORMAL HIGH (ref 70–99)
Phosphorus: 4 mg/dL (ref 2.3–4.6)
Sodium: 139 mEq/L (ref 135–145)

## 2011-01-06 LAB — DIFFERENTIAL
Basophils Absolute: 0 10*3/uL (ref 0.0–0.1)
Basophils Absolute: 0 10*3/uL (ref 0.0–0.1)
Basophils Relative: 0 % (ref 0–1)
Basophils Relative: 0 % (ref 0–1)
Eosinophils Absolute: 0.1 10*3/uL (ref 0.0–0.7)
Eosinophils Absolute: 0.2 10*3/uL (ref 0.0–0.7)
Eosinophils Absolute: 0.2 10*3/uL (ref 0.0–0.7)
Eosinophils Relative: 1 % (ref 0–5)
Eosinophils Relative: 1 % (ref 0–5)
Eosinophils Relative: 2 % (ref 0–5)
Eosinophils Relative: 2 % (ref 0–5)
Lymphocytes Relative: 18 % (ref 12–46)
Lymphocytes Relative: 21 % (ref 12–46)
Lymphs Abs: 1.8 10*3/uL (ref 0.7–4.0)
Monocytes Absolute: 0.6 10*3/uL (ref 0.1–1.0)
Monocytes Absolute: 1.2 10*3/uL — ABNORMAL HIGH (ref 0.1–1.0)
Monocytes Absolute: 1.4 10*3/uL — ABNORMAL HIGH (ref 0.1–1.0)
Monocytes Relative: 13 % — ABNORMAL HIGH (ref 3–12)
Monocytes Relative: 6 % (ref 3–12)

## 2011-01-06 LAB — POCT I-STAT, CHEM 8
BUN: 13 mg/dL (ref 6–23)
Calcium, Ion: 1.07 mmol/L — ABNORMAL LOW (ref 1.12–1.32)
Chloride: 97 mEq/L (ref 96–112)
Creatinine, Ser: 5.5 mg/dL — ABNORMAL HIGH (ref 0.4–1.2)
Glucose, Bld: 89 mg/dL (ref 70–99)
HCT: 41 % (ref 36.0–46.0)
Hemoglobin: 13.9 g/dL (ref 12.0–15.0)
Potassium: 3.7 mEq/L (ref 3.5–5.1)
Sodium: 139 mEq/L (ref 135–145)
TCO2: 33 mmol/L (ref 0–100)

## 2011-01-06 LAB — RENAL FUNCTION PANEL
Albumin: 2.3 g/dL — ABNORMAL LOW (ref 3.5–5.2)
BUN: 13 mg/dL (ref 6–23)
Creatinine, Ser: 6.52 mg/dL — ABNORMAL HIGH (ref 0.4–1.2)
Glucose, Bld: 101 mg/dL — ABNORMAL HIGH (ref 70–99)
Phosphorus: 3.5 mg/dL (ref 2.3–4.6)
Potassium: 3.3 mEq/L — ABNORMAL LOW (ref 3.5–5.1)

## 2011-01-06 LAB — COMPREHENSIVE METABOLIC PANEL
ALT: 11 U/L (ref 0–35)
ALT: 8 U/L (ref 0–35)
ALT: <8 U/L (ref 0–35)
AST: 11 U/L (ref 0–37)
AST: 15 U/L (ref 0–37)
AST: 6 U/L (ref 0–37)
AST: 8 U/L (ref 0–37)
Albumin: 2.1 g/dL — ABNORMAL LOW (ref 3.5–5.2)
Albumin: 2.7 g/dL — ABNORMAL LOW (ref 3.5–5.2)
Alkaline Phosphatase: 66 U/L (ref 39–117)
BUN: 28 mg/dL — ABNORMAL HIGH (ref 6–23)
BUN: 29 mg/dL — ABNORMAL HIGH (ref 6–23)
CO2: 27 mEq/L (ref 19–32)
CO2: 30 mEq/L (ref 19–32)
Calcium: 8.7 mg/dL (ref 8.4–10.5)
Calcium: 8.9 mg/dL (ref 8.4–10.5)
Chloride: 93 mEq/L — ABNORMAL LOW (ref 96–112)
Chloride: 96 mEq/L (ref 96–112)
Chloride: 96 mEq/L (ref 96–112)
Creatinine, Ser: 6.79 mg/dL — ABNORMAL HIGH (ref 0.4–1.2)
Creatinine, Ser: 8.68 mg/dL — ABNORMAL HIGH (ref 0.4–1.2)
Creatinine, Ser: 9.59 mg/dL — ABNORMAL HIGH (ref 0.4–1.2)
GFR calc Af Amer: 6 mL/min — ABNORMAL LOW (ref >60)
GFR calc Af Amer: 6 mL/min — ABNORMAL LOW (ref >60)
GFR calc Af Amer: 6 mL/min — ABNORMAL LOW (ref >60)
GFR calc Af Amer: 8 mL/min — ABNORMAL LOW (ref >60)
GFR calc non Af Amer: 5 mL/min — ABNORMAL LOW (ref >60)
Glucose, Bld: 137 mg/dL — ABNORMAL HIGH (ref 70–99)
Glucose, Bld: 73 mg/dL (ref 70–99)
Potassium: 3.7 mEq/L (ref 3.5–5.1)
Sodium: 135 mEq/L (ref 135–145)
Sodium: 137 mEq/L (ref 135–145)
Sodium: 137 mEq/L (ref 135–145)
Total Bilirubin: 0.6 mg/dL (ref 0.3–1.2)
Total Bilirubin: 1.8 mg/dL — ABNORMAL HIGH (ref 0.3–1.2)
Total Protein: 6 g/dL (ref 6.0–8.3)
Total Protein: 7.7 g/dL (ref 6.0–8.3)

## 2011-01-06 LAB — CBC
HCT: 29.1 % — ABNORMAL LOW (ref 36.0–46.0)
HCT: 29.8 % — ABNORMAL LOW (ref 36.0–46.0)
HCT: 34.5 % — ABNORMAL LOW (ref 36.0–46.0)
Hemoglobin: 11.3 g/dL — ABNORMAL LOW (ref 12.0–15.0)
Hemoglobin: 12.3 g/dL (ref 12.0–15.0)
Hemoglobin: 9.8 g/dL — ABNORMAL LOW (ref 12.0–15.0)
MCHC: 32.8 g/dL (ref 30.0–36.0)
MCHC: 32.9 g/dL (ref 30.0–36.0)
MCHC: 33 g/dL (ref 30.0–36.0)
MCHC: 33 g/dL (ref 30.0–36.0)
MCHC: 33.3 g/dL (ref 30.0–36.0)
MCV: 90.4 fL (ref 78.0–100.0)
MCV: 90.5 fL (ref 78.0–100.0)
MCV: 90.8 fL (ref 78.0–100.0)
MCV: 90.8 fL (ref 78.0–100.0)
MCV: 91.2 fL (ref 78.0–100.0)
Platelets: 293 10*3/uL (ref 150–400)
Platelets: 303 10*3/uL (ref 150–400)
Platelets: 347 10*3/uL (ref 150–400)
Platelets: 376 10*3/uL (ref 150–400)
RBC: 3.22 MIL/uL — ABNORMAL LOW (ref 3.87–5.11)
RBC: 3.27 MIL/uL — ABNORMAL LOW (ref 3.87–5.11)
RBC: 3.8 MIL/uL — ABNORMAL LOW (ref 3.87–5.11)
RBC: 4.12 MIL/uL (ref 3.87–5.11)
RDW: 17.9 % — ABNORMAL HIGH (ref 11.5–15.5)
RDW: 18.4 % — ABNORMAL HIGH (ref 11.5–15.5)
RDW: 18.6 % — ABNORMAL HIGH (ref 11.5–15.5)
WBC: 6.8 10*3/uL (ref 4.0–10.5)
WBC: 8.6 10*3/uL (ref 4.0–10.5)
WBC: 8.7 10*3/uL (ref 4.0–10.5)
WBC: 8.9 10*3/uL (ref 4.0–10.5)

## 2011-01-06 LAB — HEMOGLOBIN A1C: Hgb A1c MFr Bld: 5.6 % (ref 4.6–6.1)

## 2011-01-06 LAB — PROTIME-INR
INR: 1.27 (ref 0.00–1.49)
Prothrombin Time: 15.7 seconds — ABNORMAL HIGH (ref 11.6–15.2)
Prothrombin Time: 15.8 seconds — ABNORMAL HIGH (ref 11.6–15.2)

## 2011-01-06 LAB — GLUCOSE, CAPILLARY
Glucose-Capillary: 102 mg/dL — ABNORMAL HIGH (ref 70–99)
Glucose-Capillary: 110 mg/dL — ABNORMAL HIGH (ref 70–99)
Glucose-Capillary: 135 mg/dL — ABNORMAL HIGH (ref 70–99)
Glucose-Capillary: 136 mg/dL — ABNORMAL HIGH (ref 70–99)
Glucose-Capillary: 137 mg/dL — ABNORMAL HIGH (ref 70–99)
Glucose-Capillary: 139 mg/dL — ABNORMAL HIGH (ref 70–99)
Glucose-Capillary: 74 mg/dL (ref 70–99)
Glucose-Capillary: 78 mg/dL (ref 70–99)
Glucose-Capillary: 98 mg/dL (ref 70–99)

## 2011-01-06 LAB — BODY FLUID CELL COUNT WITH DIFFERENTIAL
Eos, Fluid: 2 %
Lymphs, Fluid: 21 %
Lymphs, Fluid: 24 %
Monocyte-Macrophage-Serous Fluid: 65 % (ref 50–90)
Neutrophil Count, Fluid: 7 % (ref 0–25)

## 2011-01-06 LAB — CULTURE, BLOOD (ROUTINE X 2)

## 2011-01-06 LAB — AFB CULTURE WITH SMEAR (NOT AT ARMC)

## 2011-01-06 LAB — BASIC METABOLIC PANEL
CO2: 30 mEq/L (ref 19–32)
Chloride: 97 mEq/L (ref 96–112)
GFR calc Af Amer: 6 mL/min — ABNORMAL LOW (ref >60)
Potassium: 4.7 mEq/L (ref 3.5–5.1)
Sodium: 139 mEq/L (ref 135–145)

## 2011-01-06 LAB — HEPATITIS PANEL, ACUTE
HCV Ab: NEGATIVE
Hep B C IgM: NEGATIVE
Hepatitis B Surface Ag: NEGATIVE

## 2011-01-06 LAB — PROTEIN, BODY FLUID: Total protein, fluid: 4.2 g/dL

## 2011-01-06 LAB — HEMOCCULT GUIAC POC 1CARD (OFFICE): Fecal Occult Bld: POSITIVE

## 2011-01-06 LAB — HEPATIC FUNCTION PANEL
Albumin: 2.6 g/dL — ABNORMAL LOW (ref 3.5–5.2)
Indirect Bilirubin: 0.9 mg/dL (ref 0.3–0.9)
Total Bilirubin: 1.3 mg/dL — ABNORMAL HIGH (ref 0.3–1.2)

## 2011-01-06 LAB — BODY FLUID CULTURE
Culture: NO GROWTH
Culture: NO GROWTH
Gram Stain: NONE SEEN

## 2011-01-06 LAB — CARDIAC PANEL(CRET KIN+CKTOT+MB+TROPI)
Relative Index: INVALID (ref 0.0–2.5)
Total CK: 22 U/L (ref 7–177)

## 2011-01-06 LAB — FUNGUS CULTURE W SMEAR

## 2011-01-06 LAB — GRAM STAIN

## 2011-01-06 LAB — ALBUMIN, FLUID (OTHER)

## 2011-01-06 LAB — LACTIC ACID, PLASMA: Lactic Acid, Venous: 1.6 mmol/L (ref 0.5–2.2)

## 2011-01-06 LAB — LACTATE DEHYDROGENASE, PLEURAL OR PERITONEAL FLUID
LD, Fluid: 154 U/L — ABNORMAL HIGH (ref 3–23)
LD, Fluid: 307 U/L — ABNORMAL HIGH (ref 3–23)

## 2011-01-06 LAB — LIPASE, BLOOD: Lipase: 11 U/L (ref 11–59)

## 2011-01-06 IMAGING — US IR FLUORO GUIDE CV LINE*R*
1 series · 1 of 1 positions shown · non-contrast
Comparison: none

CLINICAL DATA: End-stage renal disease, peritoneal abscess, access
for hemodialysis

[Series 1: ir fluoro guide cv line*right* · 1 of 1 slices shown]
[im 1/1]
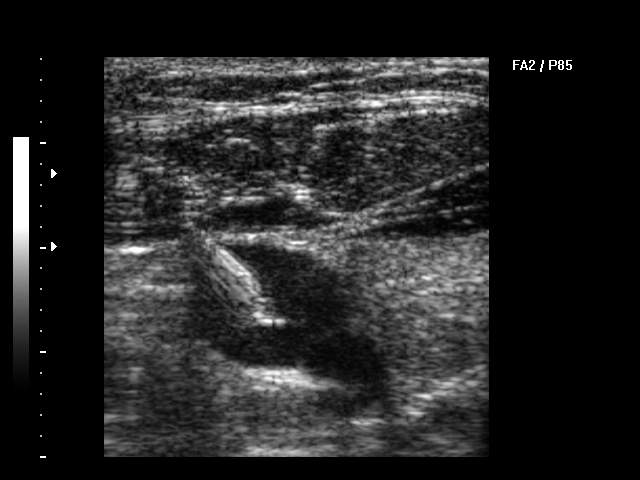

[1 of 1 positions shown; findings below may reference images not displayed]

ULTRASOUND GUIDANCE FOR VASCULAR ACCESS
RIGHT INTERNAL JUGULAR PERMANENT HEMODIALYSIS CATHETER

Date:  04/22/2009 [DATE]

Radiologist:  Nico Dayton, M.D.

Medications:  1 gram ancefadministered within 1 hour of the
procedure,1 mg Versed, 50 mcg Fentanyl

Guidance:  Ultrasound and fluoroscopic

Fluoroscopy time:  0.4 minutes

Sedation time:  20 minutes

Contrast volume:  None.

Complications:  No immediate

PROCEDURE/FINDINGS:

Informed consent was obtained from the patient following
explanation of the procedure, risks, benefits and alternatives.
The patient understands, agrees and consents for the procedure.
All questions were addressed.  A time out was performed.

Maximal barrier sterile technique utilized including caps, mask,
sterile gowns, sterile gloves, large sterile drape, hand hygiene,
and 2% chlorhexidine scrub.

Under sterile conditions and local anesthesia, right internal
jugular micropuncture venous access was performed with ultrasound.
Images were obtained for documentation.  A guide wire was inserted
followed by a transitional dilator.  Next, a 0.035 guidewire was
advanced into the IVC with a 5-French catheter.  Measurements were
obtained from the right venotomy site to the proximal right atrium.
In the right infraclavicular chest, a subcutaneous tunnel was
created under sterile conditions and local anesthesia.  1%
lidocaine with epinephrine was utilized for this.  The 19 cm tip to
cuff HemoSplit catheter was tunneled subcutaneously to the venotomy
site and inserted into the SVC/RA junction through a valved peel-
away sheath.  Position was confirmed with fluoroscopy.  Images were
obtained for documentation.  Blood was aspirated from the catheter
followed by saline and heparin flushes.  The appropriate volume and
strength of heparin was instilled in each lumen.  Caps were
applied.  The catheter was secured at the tunnel site with Gelfoam
and a pursestring suture.  The venotomy site was closed with
subcuticular Vicryl suture.  Dermabond was applied to the small
right neck incision.  A dry sterile dressing was applied.  The
catheter is ready for use.  No immediate complications.
IMPRESSION: Ultrasound and fluoroscopically guided right internal jugular
tunneled hemodialysis catheter (19 cm tip to cuff HemoSplit
catheter).

## 2011-01-08 LAB — CBC
HCT: 32.7 % — ABNORMAL LOW (ref 36.0–46.0)
Hemoglobin: 10.4 g/dL — ABNORMAL LOW (ref 12.0–15.0)
MCHC: 31.9 g/dL (ref 30.0–36.0)
MCV: 92.6 fL (ref 78.0–100.0)
Platelets: 234 K/uL (ref 150–400)
RBC: 3.53 MIL/uL — ABNORMAL LOW (ref 3.87–5.11)
RDW: 18.9 % — ABNORMAL HIGH (ref 11.5–15.5)
WBC: 8.7 K/uL (ref 4.0–10.5)

## 2011-01-08 LAB — CULTURE, BLOOD (ROUTINE X 2)
Culture: NO GROWTH
Culture: NO GROWTH

## 2011-01-08 LAB — DIFFERENTIAL
Basophils Absolute: 0 10*3/uL (ref 0.0–0.1)
Basophils Relative: 0 % (ref 0–1)
Eosinophils Absolute: 0.1 K/uL (ref 0.0–0.7)
Eosinophils Relative: 1 % (ref 0–5)
Lymphocytes Relative: 8 % — ABNORMAL LOW (ref 12–46)
Lymphs Abs: 0.7 K/uL (ref 0.7–4.0)
Monocytes Absolute: 0.7 K/uL (ref 0.1–1.0)
Monocytes Relative: 8 % (ref 3–12)
Neutro Abs: 7.3 10*3/uL (ref 1.7–7.7)
Neutrophils Relative %: 84 % — ABNORMAL HIGH (ref 43–77)

## 2011-01-08 LAB — BASIC METABOLIC PANEL WITH GFR
BUN: 7 mg/dL (ref 6–23)
Chloride: 98 meq/L (ref 96–112)
Potassium: 3.2 meq/L — ABNORMAL LOW (ref 3.5–5.1)

## 2011-01-08 LAB — BASIC METABOLIC PANEL
CO2: 30 mEq/L (ref 19–32)
Calcium: 8.2 mg/dL — ABNORMAL LOW (ref 8.4–10.5)
Creatinine, Ser: 6.79 mg/dL — ABNORMAL HIGH (ref 0.4–1.2)
GFR calc Af Amer: 8 mL/min — ABNORMAL LOW (ref >60)
GFR calc non Af Amer: 7 mL/min — ABNORMAL LOW (ref >60)
Glucose, Bld: 127 mg/dL — ABNORMAL HIGH (ref 70–99)
Sodium: 135 mEq/L (ref 135–145)

## 2011-01-09 LAB — DIFFERENTIAL
Basophils Relative: 0 % (ref 0–1)
Eosinophils Absolute: 0.1 10*3/uL (ref 0.0–0.7)
Monocytes Relative: 11 % (ref 3–12)
Neutrophils Relative %: 66 % (ref 43–77)

## 2011-01-09 LAB — ANAEROBIC CULTURE: Gram Stain: NONE SEEN

## 2011-01-09 LAB — BODY FLUID CULTURE: Gram Stain: NONE SEEN

## 2011-01-09 LAB — POCT I-STAT 4, (NA,K, GLUC, HGB,HCT)
Glucose, Bld: 66 mg/dL — ABNORMAL LOW (ref 70–99)
HCT: 32 % — ABNORMAL LOW (ref 36.0–46.0)
Hemoglobin: 10.9 g/dL — ABNORMAL LOW (ref 12.0–15.0)
Potassium: 3.6 mEq/L (ref 3.5–5.1)

## 2011-01-09 LAB — BASIC METABOLIC PANEL
BUN: 16 mg/dL (ref 6–23)
CO2: 30 mEq/L (ref 19–32)
Chloride: 96 mEq/L (ref 96–112)
Creatinine, Ser: 7.36 mg/dL — ABNORMAL HIGH (ref 0.4–1.2)
GFR calc Af Amer: 8 mL/min — ABNORMAL LOW (ref >60)
Glucose, Bld: 220 mg/dL — ABNORMAL HIGH (ref 70–99)

## 2011-01-09 LAB — CBC
MCHC: 32.8 g/dL (ref 30.0–36.0)
MCV: 99.1 fL (ref 78.0–100.0)
RBC: 2.67 MIL/uL — ABNORMAL LOW (ref 3.87–5.11)
RDW: 17.5 % — ABNORMAL HIGH (ref 11.5–15.5)

## 2011-01-09 LAB — GLUCOSE, CAPILLARY
Glucose-Capillary: 150 mg/dL — ABNORMAL HIGH (ref 70–99)
Glucose-Capillary: 55 mg/dL — ABNORMAL LOW (ref 70–99)

## 2011-01-10 LAB — DIFFERENTIAL
Basophils Absolute: 0 10*3/uL (ref 0.0–0.1)
Basophils Absolute: 0 10*3/uL (ref 0.0–0.1)
Basophils Absolute: 0 10*3/uL (ref 0.0–0.1)
Basophils Absolute: 0 10*3/uL (ref 0.0–0.1)
Basophils Absolute: 0.1 10*3/uL (ref 0.0–0.1)
Basophils Absolute: 0 10*3/uL (ref 0.0–0.1)
Basophils Absolute: 0 10*3/uL (ref 0.0–0.1)
Basophils Absolute: 0.2 10*3/uL — ABNORMAL HIGH (ref 0.0–0.1)
Basophils Relative: 0 % (ref 0–1)
Basophils Relative: 0 % (ref 0–1)
Basophils Relative: 0 % (ref 0–1)
Basophils Relative: 1 % (ref 0–1)
Basophils Relative: 1 % (ref 0–1)
Basophils Relative: 0 % (ref 0–1)
Eosinophils Absolute: 0 10*3/uL (ref 0.0–0.7)
Eosinophils Absolute: 0.4 10*3/uL (ref 0.0–0.7)
Eosinophils Absolute: 0.4 10*3/uL (ref 0.0–0.7)
Eosinophils Absolute: 0.5 10*3/uL (ref 0.0–0.7)
Eosinophils Absolute: 0 10*3/uL (ref 0.0–0.7)
Eosinophils Absolute: 0 10*3/uL (ref 0.0–0.7)
Eosinophils Absolute: 0.3 10*3/uL (ref 0.0–0.7)
Eosinophils Relative: 0 % (ref 0–5)
Eosinophils Relative: 3 % (ref 0–5)
Eosinophils Relative: 4 % (ref 0–5)
Eosinophils Relative: 4 % (ref 0–5)
Lymphocytes Relative: 10 % — ABNORMAL LOW (ref 12–46)
Lymphocytes Relative: 12 % (ref 12–46)
Lymphocytes Relative: 16 % (ref 12–46)
Lymphocytes Relative: 19 % (ref 12–46)
Lymphocytes Relative: 4 % — ABNORMAL LOW (ref 12–46)
Lymphocytes Relative: 4 % — ABNORMAL LOW (ref 12–46)
Lymphocytes Relative: 6 % — ABNORMAL LOW (ref 12–46)
Lymphs Abs: 1.4 10*3/uL (ref 0.7–4.0)
Lymphs Abs: 1.6 10*3/uL (ref 0.7–4.0)
Lymphs Abs: 1.6 10*3/uL (ref 0.7–4.0)
Lymphs Abs: 2.5 10*3/uL (ref 0.7–4.0)
Lymphs Abs: 1 10*3/uL (ref 0.7–4.0)
Monocytes Absolute: 0.6 10*3/uL (ref 0.1–1.0)
Monocytes Absolute: 0.7 10*3/uL (ref 0.1–1.0)
Monocytes Absolute: 0.7 10*3/uL (ref 0.1–1.0)
Monocytes Absolute: 0.7 10*3/uL (ref 0.1–1.0)
Monocytes Absolute: 0.8 10*3/uL (ref 0.1–1.0)
Monocytes Absolute: 1.1 10*3/uL — ABNORMAL HIGH (ref 0.1–1.0)
Monocytes Absolute: 0.8 10*3/uL (ref 0.1–1.0)
Monocytes Relative: 10 % (ref 3–12)
Monocytes Relative: 5 % (ref 3–12)
Monocytes Relative: 5 % (ref 3–12)
Monocytes Relative: 6 % (ref 3–12)
Monocytes Relative: 6 % (ref 3–12)
Monocytes Relative: 8 % (ref 3–12)
Monocytes Relative: 5 % (ref 3–12)
Neutro Abs: 10.8 10*3/uL — ABNORMAL HIGH (ref 1.7–7.7)
Neutro Abs: 7.7 10*3/uL (ref 1.7–7.7)
Neutro Abs: 8.8 10*3/uL — ABNORMAL HIGH (ref 1.7–7.7)
Neutro Abs: 9.5 10*3/uL — ABNORMAL HIGH (ref 1.7–7.7)
Neutrophils Relative %: 69 % (ref 43–77)
Neutrophils Relative %: 70 % (ref 43–77)
Neutrophils Relative %: 76 % (ref 43–77)
Neutrophils Relative %: 77 % (ref 43–77)
Neutrophils Relative %: 81 % — ABNORMAL HIGH (ref 43–77)
Neutrophils Relative %: 85 % — ABNORMAL HIGH (ref 43–77)
Neutrophils Relative %: 89 % — ABNORMAL HIGH (ref 43–77)
Neutrophils Relative %: 91 % — ABNORMAL HIGH (ref 43–77)

## 2011-01-10 LAB — GLUCOSE, CAPILLARY
Glucose-Capillary: 104 mg/dL — ABNORMAL HIGH (ref 70–99)
Glucose-Capillary: 122 mg/dL — ABNORMAL HIGH (ref 70–99)
Glucose-Capillary: 135 mg/dL — ABNORMAL HIGH (ref 70–99)
Glucose-Capillary: 137 mg/dL — ABNORMAL HIGH (ref 70–99)
Glucose-Capillary: 138 mg/dL — ABNORMAL HIGH (ref 70–99)
Glucose-Capillary: 146 mg/dL — ABNORMAL HIGH (ref 70–99)
Glucose-Capillary: 159 mg/dL — ABNORMAL HIGH (ref 70–99)
Glucose-Capillary: 199 mg/dL — ABNORMAL HIGH (ref 70–99)
Glucose-Capillary: 208 mg/dL — ABNORMAL HIGH (ref 70–99)
Glucose-Capillary: 259 mg/dL — ABNORMAL HIGH (ref 70–99)
Glucose-Capillary: 268 mg/dL — ABNORMAL HIGH (ref 70–99)
Glucose-Capillary: 287 mg/dL — ABNORMAL HIGH (ref 70–99)
Glucose-Capillary: 346 mg/dL — ABNORMAL HIGH (ref 70–99)
Glucose-Capillary: 360 mg/dL — ABNORMAL HIGH (ref 70–99)
Glucose-Capillary: 478 mg/dL — ABNORMAL HIGH (ref 70–99)
Glucose-Capillary: 576 mg/dL (ref 70–99)
Glucose-Capillary: 115 mg/dL — ABNORMAL HIGH (ref 70–99)
Glucose-Capillary: 120 mg/dL — ABNORMAL HIGH (ref 70–99)
Glucose-Capillary: 128 mg/dL — ABNORMAL HIGH (ref 70–99)
Glucose-Capillary: 130 mg/dL — ABNORMAL HIGH (ref 70–99)
Glucose-Capillary: 135 mg/dL — ABNORMAL HIGH (ref 70–99)
Glucose-Capillary: 148 mg/dL — ABNORMAL HIGH (ref 70–99)
Glucose-Capillary: 188 mg/dL — ABNORMAL HIGH (ref 70–99)
Glucose-Capillary: 193 mg/dL — ABNORMAL HIGH (ref 70–99)
Glucose-Capillary: 230 mg/dL — ABNORMAL HIGH (ref 70–99)
Glucose-Capillary: 235 mg/dL — ABNORMAL HIGH (ref 70–99)
Glucose-Capillary: 238 mg/dL — ABNORMAL HIGH (ref 70–99)
Glucose-Capillary: 263 mg/dL — ABNORMAL HIGH (ref 70–99)
Glucose-Capillary: 98 mg/dL (ref 70–99)

## 2011-01-10 LAB — COMPREHENSIVE METABOLIC PANEL
ALT: 13 U/L (ref 0–35)
AST: 14 U/L (ref 0–37)
Albumin: 1.7 g/dL — ABNORMAL LOW (ref 3.5–5.2)
BUN: 62 mg/dL — ABNORMAL HIGH (ref 6–23)
BUN: 51 mg/dL — ABNORMAL HIGH (ref 6–23)
CO2: 24 mEq/L (ref 19–32)
CO2: 25 mEq/L (ref 19–32)
Calcium: 7.4 mg/dL — ABNORMAL LOW (ref 8.4–10.5)
Chloride: 91 mEq/L — ABNORMAL LOW (ref 96–112)
Chloride: 93 mEq/L — ABNORMAL LOW (ref 96–112)
Chloride: 85 mEq/L — ABNORMAL LOW (ref 96–112)
Creatinine, Ser: 10.59 mg/dL — ABNORMAL HIGH (ref 0.4–1.2)
Creatinine, Ser: 13.01 mg/dL — ABNORMAL HIGH (ref 0.4–1.2)
GFR calc Af Amer: 5 mL/min — ABNORMAL LOW (ref >60)
GFR calc non Af Amer: 4 mL/min — ABNORMAL LOW (ref >60)
GFR calc non Af Amer: 4 mL/min — ABNORMAL LOW (ref >60)
GFR calc non Af Amer: 3 mL/min — ABNORMAL LOW (ref >60)
Glucose, Bld: 291 mg/dL — ABNORMAL HIGH (ref 70–99)
Sodium: 131 mEq/L — ABNORMAL LOW (ref 135–145)
Total Bilirubin: 0.5 mg/dL (ref 0.3–1.2)
Total Bilirubin: 0.6 mg/dL (ref 0.3–1.2)
Total Bilirubin: 0.8 mg/dL (ref 0.3–1.2)

## 2011-01-10 LAB — CBC
HCT: 22.9 % — ABNORMAL LOW (ref 36.0–46.0)
HCT: 22.9 % — ABNORMAL LOW (ref 36.0–46.0)
HCT: 23.9 % — ABNORMAL LOW (ref 36.0–46.0)
HCT: 25.4 % — ABNORMAL LOW (ref 36.0–46.0)
HCT: 33.8 % — ABNORMAL LOW (ref 36.0–46.0)
Hemoglobin: 7.2 g/dL — CL (ref 12.0–15.0)
Hemoglobin: 7.8 g/dL — CL (ref 12.0–15.0)
Hemoglobin: 8 g/dL — ABNORMAL LOW (ref 12.0–15.0)
Hemoglobin: 8.5 g/dL — ABNORMAL LOW (ref 12.0–15.0)
Hemoglobin: 8.8 g/dL — ABNORMAL LOW (ref 12.0–15.0)
Hemoglobin: 9.1 g/dL — ABNORMAL LOW (ref 12.0–15.0)
Hemoglobin: 9.6 g/dL — ABNORMAL LOW (ref 12.0–15.0)
MCHC: 33.4 g/dL (ref 30.0–36.0)
MCHC: 33.9 g/dL (ref 30.0–36.0)
MCHC: 34 g/dL (ref 30.0–36.0)
MCHC: 34.7 g/dL (ref 30.0–36.0)
MCHC: 34.7 g/dL (ref 30.0–36.0)
MCHC: 34.9 g/dL (ref 30.0–36.0)
MCHC: 33.6 g/dL (ref 30.0–36.0)
MCHC: 33.7 g/dL (ref 30.0–36.0)
MCV: 101.2 fL — ABNORMAL HIGH (ref 78.0–100.0)
MCV: 98.3 fL (ref 78.0–100.0)
MCV: 98.4 fL (ref 78.0–100.0)
MCV: 98.7 fL (ref 78.0–100.0)
MCV: 99 fL (ref 78.0–100.0)
MCV: 99.2 fL (ref 78.0–100.0)
MCV: 100.9 fL — ABNORMAL HIGH (ref 78.0–100.0)
MCV: 101 fL — ABNORMAL HIGH (ref 78.0–100.0)
Platelets: 258 10*3/uL (ref 150–400)
RBC: 2.33 MIL/uL — ABNORMAL LOW (ref 3.87–5.11)
RBC: 2.35 MIL/uL — ABNORMAL LOW (ref 3.87–5.11)
RBC: 2.43 MIL/uL — ABNORMAL LOW (ref 3.87–5.11)
RBC: 2.52 MIL/uL — ABNORMAL LOW (ref 3.87–5.11)
RBC: 2.52 MIL/uL — ABNORMAL LOW (ref 3.87–5.11)
RBC: 2.54 MIL/uL — ABNORMAL LOW (ref 3.87–5.11)
RBC: 2.59 MIL/uL — ABNORMAL LOW (ref 3.87–5.11)
RBC: 2.69 MIL/uL — ABNORMAL LOW (ref 3.87–5.11)
RBC: 3.34 MIL/uL — ABNORMAL LOW (ref 3.87–5.11)
RDW: 15.6 % — ABNORMAL HIGH (ref 11.5–15.5)
RDW: 15.7 % — ABNORMAL HIGH (ref 11.5–15.5)
RDW: 15.6 % — ABNORMAL HIGH (ref 11.5–15.5)
RDW: 15.8 % — ABNORMAL HIGH (ref 11.5–15.5)
WBC: 10.6 10*3/uL — ABNORMAL HIGH (ref 4.0–10.5)
WBC: 11.6 10*3/uL — ABNORMAL HIGH (ref 4.0–10.5)
WBC: 13.1 10*3/uL — ABNORMAL HIGH (ref 4.0–10.5)
WBC: 13.5 10*3/uL — ABNORMAL HIGH (ref 4.0–10.5)
WBC: 25.3 10*3/uL — ABNORMAL HIGH (ref 4.0–10.5)
WBC: 26 10*3/uL — ABNORMAL HIGH (ref 4.0–10.5)

## 2011-01-10 LAB — CULTURE, BLOOD (ROUTINE X 2): Culture: NO GROWTH

## 2011-01-10 LAB — PROTIME-INR
INR: 1.5 (ref 0.00–1.49)
INR: 1.5 (ref 0.00–1.49)
Prothrombin Time: 18.4 seconds — ABNORMAL HIGH (ref 11.6–15.2)

## 2011-01-10 LAB — RENAL FUNCTION PANEL
Albumin: 1.8 g/dL — ABNORMAL LOW (ref 3.5–5.2)
Albumin: 1.8 g/dL — ABNORMAL LOW (ref 3.5–5.2)
BUN: 45 mg/dL — ABNORMAL HIGH (ref 6–23)
BUN: 54 mg/dL — ABNORMAL HIGH (ref 6–23)
BUN: 61 mg/dL — ABNORMAL HIGH (ref 6–23)
BUN: 58 mg/dL — ABNORMAL HIGH (ref 6–23)
CO2: 22 mEq/L (ref 19–32)
CO2: 23 mEq/L (ref 19–32)
Chloride: 92 mEq/L — ABNORMAL LOW (ref 96–112)
Chloride: 92 mEq/L — ABNORMAL LOW (ref 96–112)
Chloride: 93 mEq/L — ABNORMAL LOW (ref 96–112)
Chloride: 97 mEq/L (ref 96–112)
Creatinine, Ser: 7.09 mg/dL — ABNORMAL HIGH (ref 0.4–1.2)
Creatinine, Ser: 9.99 mg/dL — ABNORMAL HIGH (ref 0.4–1.2)
Creatinine, Ser: 12.44 mg/dL — ABNORMAL HIGH (ref 0.4–1.2)
GFR calc Af Amer: 8 mL/min — ABNORMAL LOW (ref >60)
GFR calc non Af Amer: 7 mL/min — ABNORMAL LOW (ref >60)
Glucose, Bld: 367 mg/dL — ABNORMAL HIGH (ref 70–99)
Glucose, Bld: 440 mg/dL — ABNORMAL HIGH (ref 70–99)
Glucose, Bld: 498 mg/dL — ABNORMAL HIGH (ref 70–99)
Glucose, Bld: 241 mg/dL — ABNORMAL HIGH (ref 70–99)
Phosphorus: 3.6 mg/dL (ref 2.3–4.6)
Phosphorus: 6.3 mg/dL — ABNORMAL HIGH (ref 2.3–4.6)
Potassium: 3.1 mEq/L — ABNORMAL LOW (ref 3.5–5.1)
Potassium: 3.3 mEq/L — ABNORMAL LOW (ref 3.5–5.1)
Potassium: 4 mEq/L (ref 3.5–5.1)
Potassium: 3.2 mEq/L — ABNORMAL LOW (ref 3.5–5.1)
Sodium: 124 mEq/L — ABNORMAL LOW (ref 135–145)
Sodium: 127 mEq/L — ABNORMAL LOW (ref 135–145)

## 2011-01-10 LAB — BASIC METABOLIC PANEL
BUN: 28 mg/dL — ABNORMAL HIGH (ref 6–23)
BUN: 44 mg/dL — ABNORMAL HIGH (ref 6–23)
CO2: 28 mEq/L (ref 19–32)
CO2: 22 mEq/L (ref 19–32)
Chloride: 100 mEq/L (ref 96–112)
Chloride: 102 mEq/L (ref 96–112)
Chloride: 105 mEq/L (ref 96–112)
Creatinine, Ser: 10.7 mg/dL — ABNORMAL HIGH (ref 0.4–1.2)
Creatinine, Ser: 6.15 mg/dL — ABNORMAL HIGH (ref 0.4–1.2)
GFR calc Af Amer: 6 mL/min — ABNORMAL LOW (ref >60)
GFR calc Af Amer: 9 mL/min — ABNORMAL LOW (ref >60)
GFR calc non Af Amer: 5 mL/min — ABNORMAL LOW (ref >60)
Glucose, Bld: 116 mg/dL — ABNORMAL HIGH (ref 70–99)
Glucose, Bld: 243 mg/dL — ABNORMAL HIGH (ref 70–99)
Potassium: 3.7 mEq/L (ref 3.5–5.1)
Potassium: 3.7 mEq/L (ref 3.5–5.1)
Potassium: 4.1 mEq/L (ref 3.5–5.1)
Potassium: 3.1 mEq/L — ABNORMAL LOW (ref 3.5–5.1)
Sodium: 136 mEq/L (ref 135–145)
Sodium: 138 mEq/L (ref 135–145)
Sodium: 129 mEq/L — ABNORMAL LOW (ref 135–145)

## 2011-01-10 LAB — FERRITIN
Ferritin: 457 ng/mL — ABNORMAL HIGH (ref 10–291)
Ferritin: 461 ng/mL — ABNORMAL HIGH (ref 10–291)

## 2011-01-10 LAB — ALBUMIN
Albumin: 1.7 g/dL — ABNORMAL LOW (ref 3.5–5.2)
Albumin: 1.7 g/dL — ABNORMAL LOW (ref 3.5–5.2)

## 2011-01-10 LAB — CARBOXYHEMOGLOBIN
Carboxyhemoglobin: 1.6 % — ABNORMAL HIGH (ref 0.5–1.5)
Methemoglobin: 1.1 % (ref 0.0–1.5)

## 2011-01-10 LAB — ANAEROBIC CULTURE

## 2011-01-10 LAB — CARDIAC PANEL(CRET KIN+CKTOT+MB+TROPI)
Relative Index: INVALID (ref 0.0–2.5)
Total CK: 35 U/L (ref 7–177)

## 2011-01-10 LAB — APTT: aPTT: 34 seconds (ref 24–37)

## 2011-01-10 LAB — IRON AND TIBC
Iron: 41 ug/dL — ABNORMAL LOW (ref 42–135)
TIBC: 146 ug/dL — ABNORMAL LOW (ref 250–470)

## 2011-01-10 LAB — PHOSPHORUS
Phosphorus: 4.7 mg/dL — ABNORMAL HIGH (ref 2.3–4.6)
Phosphorus: 6.6 mg/dL — ABNORMAL HIGH (ref 2.3–4.6)
Phosphorus: 7.4 mg/dL — ABNORMAL HIGH (ref 2.3–4.6)

## 2011-01-10 LAB — IRON: Iron: 28 ug/dL — ABNORMAL LOW (ref 42–135)

## 2011-01-10 LAB — MAGNESIUM: Magnesium: 1.6 mg/dL (ref 1.5–2.5)

## 2011-01-10 LAB — CORTISOL: Cortisol, Plasma: 12.4 ug/dL

## 2011-01-10 LAB — CULTURE, ROUTINE-ABSCESS

## 2011-01-10 LAB — MRSA PCR SCREENING: MRSA by PCR: NEGATIVE

## 2011-01-12 LAB — GLUCOSE, CAPILLARY
Glucose-Capillary: 153 mg/dL — ABNORMAL HIGH (ref 70–99)
Glucose-Capillary: 47 mg/dL — ABNORMAL LOW (ref 70–99)
Glucose-Capillary: 49 mg/dL — ABNORMAL LOW (ref 70–99)

## 2011-02-16 NOTE — Discharge Summary (Signed)
[name], [name] NO.:  [id number]   MEDICAL RECORD NO.:  [id number]           PATIENT TYPE:   LOCATION:                                 FACILITY:   PHYSICIAN:  Sherril Croon, M.D.   DATE OF BIRTH:  [birth year]   DATE OF ADMISSION:  DATE OF DISCHARGE:                               DISCHARGE SUMMARY   ADDENDUM   There was no decreased magnesium levels noted during this  hospitalization as previously stated in the prior discharge summary.  This will not require immediate follow up as previously stated when  discharged at the outpatient center.      Tracey P. Sherrod, NP      Sherril Croon, M.D.  Electronically Signed    TPS/MEDQ  D:  04/02/2008  T:  04/03/2008  Job:  [id number]

## 2011-02-16 NOTE — Assessment & Plan Note (Signed)
OFFICE VISIT   [name], [name]  DOB:  Jan 09, 1976                                       05/01/2009  [id number]   The patient is 35 year old female with end-stage renal failure now on  hemodialysis.  She has a Marketing executive in place.  She was seen by Dr.  Scot Dock on 10/08/2008 for an evaluation of permanent hemodialysis  access.  At that time it was felt she would require placement of an AV  graft.  Therefore the procedure was not pursued.  I have reviewed her  duplex evaluation, her left cephalic vein appears to be small although  an attempt at a brachiocephalic AV fistula may be worthwhile.   She has 2+ left radial and brachial pulses.  BP is 117/70, pulse 86 per  minute.   I have set her up for a possible AV fistula with potential for AV graft  placement on 05/08/2009 at [facility] by Dr. Scot Dock.   Dorothea Glassman, M.D.  Electronically Signed   PGH/MEDQ  D:  05/01/2009  T:  05/02/2009  Job:  [id number]   cc:   Sherril Croon, M.D.  [facility]

## 2011-02-16 NOTE — Discharge Summary (Signed)
NAMEARNIE, [name] NO.:  [id number]   MEDICAL RECORD NO.:  [id number]          PATIENT TYPE:  INP   LOCATION:  6730                         FACILITY:  [facility]   PHYSICIAN:  Sherril Croon, M.D.   DATE OF BIRTH:  Oct 19, 1975   DATE OF ADMISSION:  03/30/2008  DATE OF DISCHARGE:  04/02/2008                               DISCHARGE SUMMARY   DISCHARGE DIAGNOSES:  1. Peritonitis.  2. Anemia.  3. Hypertension.  4. Diabetes mellitus.  5. End-stage renal disease.  6. Hypokalemia.   PROCEDURE:  On April 01, 2008, abdominal CT scan with contrast.   IMPRESSION:  1. No acute intraabdominal finding.  2. Large amount of intraperitoneal dialysate fluid with free air      compatible with peritoneal dialysis.  No focal rim-enhancing fluid      collection is seen to suggest abscess formation.  No peritoneal      surface enhancement is seen, although, this does not includes the      presence of peritonitis.  On April 01, 2008, pelvic CT with      contrast, impression, no acute intrapelvic finding.  Please see CT      pelvis report above.   HISTORY OF PRESENT ILLNESS:  This is a 35 year old female who with a  past medical history that includes end-stage renal disease for the past  6 years.  She has been performing peritoneal dialysis for most of that  time.  She returned from [place] approximately 6 months ago and since  March has had 3 episodes of peritonitis.  She presented to the emergency  room today complaining of a 2-day history of nausea and vomiting,  abdominal pain, and cloudy PD fluid.  She also complained of dry heaving  and poor p.o. intake.  She is admitted for further evaluation.   ADMISSION LABORATORY DATA:  WBC 20.1, hemoglobin 11.3, hematocrit 33.1,  platelet is 321, sodium 126, potassium 4.4, chloride 91, CO2 25, glucose  507, BUN 39, creatinine 10.6, total bilirubin 0.7, alkaline phos 84, AST  13, ALT 23, total protein 5.9, albumin 2.9, calcium 8.5, and  lipase 34.  PD fluid culture preliminary results, no growth to date.  Cell count on  differential, colorless fluid with hazy appearance, 87 segmented  neutrophils, 10 monocyte microphage serous fluid.   HOSPITAL COURSE:  1. The patient was admitted.  She was placed on an empiric antibiotic      therapy.  Culture of peritoneal dialysate fluid and cell count were      sent with some results as stated above.  She received pain      medications as needed.  During her stay, she did have an episode of      elevated temperature.  She underwent an abdominal and pelvic CT      scan at that time for further evaluation with results as stated      above.  Her fever resolved, and she continued antibiotic therapy      during remainder of her hospitalization, and she will continue as      an outpatient.  The remainder  of the course, which she seem to      tolerate well.  2. Anemia.  The patient's hemoglobin remained stable during her      hospitalization, and she remained without active signs of bleeding.      She continued her outpatient regimen of Epogen therapy and at the      time of discharge hemoglobin 10.9 and hematocrit 29.1.  3. Hypertension.  The patient's blood pressure remained very well      controlled during her hospitalization without any medications and      using peritoneal dialysis.  During her hospitalization, her      systolic range from the low 1 teens to [id number], diastolic rates from      [id number] to 90s.  4. Diabetes mellitus.  At the time of admission, the patient was      placed on a carbohydrate modified diet.  She received Accu-Cheks      before meals and at bedtime with sliding scale insulin therapy, and      her outpatient regimen of Lantus therapy.  Her blood glucose levels      were fair during her hospitalization.  At the time of discharge,      her blood glucose level was 216.  I thought, we will also continue      to be followup as an outpatient.  5. End-stage renal disease.   The patient continue peritoneal dialysis      per her outpatient regimen during her hospitalization, which she      seems to tolerate well without difficulty.  6. Hypokalemia.  The patient's potassium was repleted during her      hospitalization with IV and oral routes.  We will continue to      follow with her as an outpatient as this patient requires chronic      potassium supplementation.   DISCHARGE MEDICATIONS:  1. Protonix 40 mg 1 tab p.o. daily.  2. NovoLog 7 units before each meal t.i.d.  3. Lantus insulin 25 units subcu at bedtime.  4. Epogen 23 units twice weekly on Tuesdays and Thursdays.  5. WelChol 2 tabs 3 times daily p.r.n.  6. Zofran 4 mg t.i.d. to q.i.d. p.r.n. for nausea and vomiting.  7. Reglan 5 mg t.i.d. to q.i.d. p.r.n.  8. Potassium chloride 40 mEq t.i.d.  9. Megace 600 mg daily as needed.  10.Renagel 800 mg 3 tabs p.o. t.i.d. with meals.  11.Hectorol 2.5 mcg every Monday, Wednesdays, and Fridays.  12.Nephro-Vite 1 tab p.o. daily.  13.Darvocet 1 tab p.o. q.4h. p.r.n. pain.  14.Cipro 500 mg p.o. daily x2 weeks.  15.Diflucan 150 mg p.o. once.  16.Fortaz IP x2 weeks so the patient will receive at the outpatient      home training center, [facility].   DISCHARGE INSTRUCTIONS:  The patient will receive a diabetic diet.  She  will follow up with the outpatient home training center for her IP  antibiotics and peritonitis monitoring as well as her hypokalemia and  hypomagnesemia as need prior to discharge.   ACTIVITY:  As tolerated.      Tracey P. Sherrod, NP      Sherril Croon, M.D.  Electronically Signed    TPS/MEDQ  D:  04/02/2008  T:  04/03/2008  Job:  [id number]   cc:   [facility]

## 2011-02-16 NOTE — Op Note (Signed)
[name], [name]            ACCOUNT NO.:  [id number]   MEDICAL RECORD NO.:  [id number]          PATIENT TYPE:  AMB   LOCATION:  DAY                           FACILITY:  [facility]   PHYSICIAN:  Caro Hight, M.D.      DATE OF BIRTH:  January 13, 1976   DATE OF PROCEDURE:  02/10/2009  DATE OF DISCHARGE:  02/10/2009                               OPERATIVE REPORT   PROCEDURE:  Hydrogen breath test for small bowel bacterial overgrowth.   INDICATIONS FOR EXAM:  [name] is a 35 year old female who  presented with abdominal pain and diarrhea for many years.  She also  reports having gastroparesis.  She has intermittent episodes of  diarrhea.   PREPROCEDURE CHECK:  The patient presented n.p.o. after midnight.  She  denied beans, fiber cereal, smoking or antibiotic use.   BLOOD SUGAR:  Lactulose 25 g.   FINDINGS:  Initial breathalyzer read zero parts per million.  At 45  minute, the breathalyzer rate over 20 parts per million.  The next peak  was at 105 minutes.  It was over 40 parts per million.  The trough  occurred at 120 minutes at 35 parts per million.  A second peak occurred  at 135 minutes at greater than around 40 parts per million.  The second  trough occurred at 150 minute and the breathalyzer was 28 parts per  million.   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   PLAN:  Proceed with course of antibiotic therapy: CIP/FLAG for 5 days.      Caro Hight, M.D.  Electronically Signed     SM/MEDQ  D:  02/16/2009  T:  02/17/2009  Job:  [id number]   cc:   Nat Christen, M.D.

## 2011-02-16 NOTE — Op Note (Signed)
NAMEGHIA, SUTO            ACCOUNT NO.:  [id number]   MEDICAL RECORD NO.:  [id number]          PATIENT TYPE:  AMB   LOCATION:  DAY                           FACILITY:  [facility]   PHYSICIAN:  Caro Hight, M.D.      DATE OF BIRTH:  21-Oct-1975   DATE OF PROCEDURE:  02/03/2009  DATE OF DISCHARGE:                               OPERATIVE REPORT   REFERRING PHYSICIAN:  Nat Christen, MD   PROCEDURE:  Esophagogastroduodenoscopy with cold forceps biopsy of the  duodenal mucosa.   INDICATION FOR EXAM:  [name] is a 35 year old female with  diabetes.  She presents with bloating gas and stomach problem.  She has  had GI problems for years.  She is chronically maintained on Klor-Con  and oxycodone.   FINDINGS:  1. Normal esophagus without evidence of Barrett mass, erosion,      ulceration, or stricture.  2. Normal-appearing gastric mucosa without erosion or ulceration.  3. Normal duodenal bulb and second portion of duodenum.  4. Biopsies obtained via cold forceps to evaluate for celiac sprue      from the duodenum.   DIAGNOSES:  No obvious source for diarrhea and gas identified.   RECOMMENDATIONS:  1. She should continue her Kapidex.  2. Use Imodium as needed for diarrhea.  3. She should follow a lactose-free diet.  She is given a handout on      lactose-free diet.  4. Will call her with results of her biopsies.  If her biopsies do not      reveal an etiology for diarrhea, then we will consider hydrogen      breath test for small bowel bacterial overgrowth.  5. She should also add fiber to her diet.  She is given a handout on      high-fiber diet as well.  6. No aspirin, NSAIDs, or anticoagulation for 7 days.  7. She already has a followup appointment to see me in 2 months.   MEDICATIONS:  1. Demerol 100 mg IV.  2. Versed 4 mg IV.  3. Phenergan 12.5 mg IV.   PROCEDURE TECHNIQUE:  Physical exam was performed.  Informed consent was  obtained from the patient after  explaining the benefits, risks, and  alternatives to the procedure.  The patient was connected to monitor and  placed in left lateral position.  Continuous oxygen was provided by  nasal cannula and IV medicine administered through an indwelling  cannula.  After administration of sedation, the patient's esophagus was  intubated.  The scope was advanced under direct visualization to second  portion of the duodenum.  The patient was agitated and pushing the bite-  block out and body on the scope.  Duodenal biopsies were obtained, but  gastric biopsies were deferred.  The scope was removed slowly by  carefully examining the color, texture, anatomy, and integrity mucosa on  the way out.  The patient was recovered in endoscopy and discharged home  in satisfactory condition.   PATH:  Peptic duodenitis, no celiac sprue. HBT c/w SBBO. [id number] w/  improvement then diarrhea returned. Abx qmo prn.  Caro Hight, M.D.  Electronically Signed     SM/MEDQ  D:  02/03/2009  T:  02/04/2009  Job:  [id number]   cc:   Nat Christen, M.D.

## 2011-02-16 NOTE — Op Note (Signed)
[name], [name] NO.:  [id number]   MEDICAL RECORD NO.:  [id number]          PATIENT TYPE:  INP   LOCATION:  2114                         FACILITY:  [facility]   PHYSICIAN:  Sammuel Hines. Daiva Nakayama, M.D. DATE OF BIRTH:  [birth year]   DATE OF PROCEDURE:  04/16/2009  DATE OF DISCHARGE:  04/15/2009                               OPERATIVE REPORT   PREOPERATIVE DIAGNOSIS:  Necrotizing infection, left perineum.   POSTOPERATIVE DIAGNOSIS:  Necrotizing infection, left perineum.   PROCEDURE:  Incision and drainage of left perineum.   SURGEON:  Sammuel Hines. Daiva Nakayama, MD   ANESTHESIA:  General endotracheal.   PROCEDURE:  After informed consent was obtained, the patient was brought  to the operating room, placed in supine position on the operating table.  After adequate induction of general anesthesia, the patient was placed  in a lithotomy position.  Her perineum was then prepped with Betadine  and draped in the usual sterile manner.  The patient had a large  necrotizing infection in the left perineum just lateral to the labia.  All of the dead tissue in this area was excised sharply with the  electrocautery and cultures were obtained.  Once it appeared as though  most of the dead tissue was removed, the wound was treated with pulse  lavage therapy with 3 liters of saline.  Hemostasis was achieved using  the Bovie electrocautery.  The wound was then packed with moistened  Kerlix gauze and sterile dressings were applied.  The patient tolerated  the procedure well.  At the end of the case, all needle, sponge, and  instrument counts were correct.  The patient was then awakened and taken  to recovery room in stable condition.      Sammuel Hines. Daiva Nakayama, M.D.  Electronically Signed     PST/MEDQ  D:  04/16/2009  T:  04/16/2009  Job:  [id number]

## 2011-02-16 NOTE — Group Therapy Note (Signed)
[name], [name]            ACCOUNT NO.:  [id number]   MEDICAL RECORD NO.:  [id number]          PATIENT TYPE:  INP   LOCATION:  [id number]                          FACILITY:  [facility]   PHYSICIAN:  Anselmo Pickler, DO    DATE OF BIRTH:  August 28, 1976   DATE OF PROCEDURE:  DATE OF DISCHARGE:                                 PROGRESS NOTE   The patient was admitted for acute left lower quadrant pain, possibly to  rule out spontaneous bacterial peritonitis.  The patient states that she  seems to be doing better today without any problems.  We got back fluid  analysis today of her peritoneal fluid.  It is colorless, clear, white  count of 100 which is normal, segs are 4, lymphocytosis is 94 and there  are no cells seen and culture is no growth and body fluid has also been  evaluated.  The patient says that she is feeling better today and is  ready to go home.  I told her I would review her chart to see if she was  actually ready to go home at this point in time.  Also, she states that  she has all of her equipment here for peritoneal dialysis and would  prefer to do it at home if she could today.   PHYSICAL EXAMINATION:  Temperature 98.4, pulse 94, respirations 20,  blood pressure 155/89.  The patient is well-developed, well-nourished in no acute distress.  HEART:  Regular rate and rhythm.  LUNGS:  Clear to auscultation bilaterally.  ABDOMEN:  Soft, nontender and nondistended.  LEGS:  Positive pulses.  No edema, ecchymosis or cyanosis.    Her white count is 5.3.  Her hemoglobin is 9.4, hematocrit 28.2, plate  count of [id number].  Sugars are 214.  Currently, sodium is 132, potassium is  3.6, chloride is 97, CO2 of 27, glucose was 442, BUN is 23 and  creatinine is 11.43.  Also her magnesium was low from yesterday at 1.2.   ASSESSMENT/PLAN:  1. Abdominal pain.  The patient states that it is resolved and said      that it is a different one from when she has had bacterial      peritonitis.  We have  ruled that out based on her peritoneal fluid.  2. End-stage renal disease.  The patient is getting peritoneal      dialysis with that and she does that herself.  3. Hyponatremia.  4. Hypomagnesemia.  5. Hyperglycemia.   We will go ahead and discharge the patient this afternoon.  We will  continue to monitor her blood sugars and see how she does and possibly  we will  alter medications so that she can go home and have better  control of her blood sugars.  We will also recommend that she see Dr.  Truett Mainland as soon as possible to regulate her blood sugars at this point  in time.  The patient is not on any kind of steroid and she was also  started on antibiotics and of course we will discontinue that since  there is no evidence of any infection  at this point in time.  I will go  ahead and dictate a discharge summary.      Anselmo Pickler, DO  Electronically Signed     CB/MEDQ  D:  08/14/2008  T:  08/14/2008  Job:  [id number]

## 2011-02-16 NOTE — Discharge Summary (Signed)
NAMELYNDALL, FAVA            ACCOUNT NO.:  [id number]   MEDICAL RECORD NO.:  [id number]          PATIENT TYPE:  INP   LOCATION:  6715                         FACILITY:  [facility]   PHYSICIAN:  Earnstine Regal, MD      DATE OF BIRTH:  Feb 18, 1976   DATE OF ADMISSION:  04/15/2009  DATE OF DISCHARGE:  04/26/2009                               DISCHARGE SUMMARY   PROCEDURE:  Incision and drainage of left perineum by Dr. Autumn Messing on  April 16, 2009.   CONSULTANTS:  Dr. Lorrene Reid with Nephrology and Dr. Elsworth Soho with [facility].   REASON FOR ADMISSION:  [name] is a 35 year old female who began  having some pain and swelling in her left perineum area on Saturday  prior to physician.  She met her medical doctor on Monday who insisted  to start raining as she had no improvement.  It has continued to  progress since then and now has some more drainage and odor with  increased pain.  She denies any fevers, chills, chest pain or shortness  of breath.  On exam, her perineum and labial area have a large necrotic  abscess status post drainage.  Her white count is 26,000 and her glucose  is 572.  Her CT scan shows a large gas-filled area in the left perineum.  Please see admitting history and physical for further details.   ADMITTING DIAGNOSES:  1. 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  2. Diabetes mellitus.  3. Hypertension.  4. End-stage renal disease.  He normally does peritoneal dialysis at      home.   HOSPITAL COURSE:  At this time, the patient was admitted and taken  straight to the operating room.  At this time an incision and drainage  of the perineal and left labial area was completed.  The patient  tolerated this procedure well, but due to extensiveness of the necrosis,  the patient was then sent to the ICU Unit for close observation.  On  postoperative day #1, the patient was doing well and had no major leak.  Some of her packing was removed and we did not  see any more necrotic  area suggesting a re-incision and drainage needing to be done.  Betadine  dressing changes were used twice a day over the next several days.  On  postoperative day 2, there was a small area of necrotic tissue in the  wound and a small debridement was done at the bedside.  By postoperative  day 3, the patient was felt stable to move out of the ICU Unit.  By  postoperative day 5, we had discontinued Betadine soaked dressing  changes and switched to hydrogel dressing changes.  After examination of  the wound it was mostly consisted of fibrin with a small area on the  lateral edge that was black and necrotic.  No purulent drainage was  noted.  At this time, it was felt that ultrasound therapy with physical  therapy would be appropriate to clean the wound.  Therefore, over the  next several days Physical Therapy worked on  the wound and just cleaned  the wound up quite dramatically.  By postoperative day 9, the patient's  wound was about 60% clean.  Based on Physical Therapy's note, much of  the slough in the wound is adherent which they feel will be well  debrided with wet-to-dry dressing changes at home.  Otherwise, there are  no more necrotic areas.  There is no further purulent drainage and the  plan as far as her wound is to continue ultrasound therapy until  postoperative day 10 and then we can discharge home with home health  nurse do normal saline wet-to-dry dressing changes twice a day.  The  patient's white count also decreased throughout the hospitalization.  By  postoperative day 7, the patient's antibiotics were discontinued as the  wound was fairly clean and showed no signs of further infection.   Upon admission, the patient had a glucose of 572.  Postoperatively, in  the ICU the patient was put on a Glucommander; however, by postoperative  day 1, her sugars had improved and decreased well enough that the  Glucommander was discontinued and she was put on  sliding scale with  coverage at night.  Throughout hospitalization, her sugars increased  back as before in 500 range, and once the patient was tolerating a diet,  she was placed on a moderate insulin sliding scale and she was covered  with, I believe, 23 units of insulin at night.  By the end of the  hospitalization, her sugars were much better controlled, and by  postoperative day 9 her blood sugars were in the 120s.   Also upon arrival the patient was on peritoneal dialysis.  However, due  to the concern that this may get infected, she continued peritoneal  dialysis until an access could be placed, and on postoperative day 7,  the patient started hemodialysis.  The patient tolerated this well and  continued this throughout her hospitalization.  Also of note, the  patient did become anemic towards the end of her hospitalization with a  hemoglobin of 7.2.  Her Epogen is being managed by the renal team and  possible transfusion is pending at this time if the patient becomes  symptomatic.   DISCHARGE DIAGNOSES:  1. Fournier gangrene of the perineum and left labia.  2. Status post incision and drainage.  3. Diabetes mellitus.  4. End-stage renal disease currently on hemodialysis.  5. Anemia.  6. Hypertension, well-controlled.  7. Hypokalemia which has resolved.   DISCHARGE MEDICATIONS:  At this time, the patient may resume all home  medications including:  1. Potassium 40 mEq p.o. 3 times a day.  2. Dexilant 60 mg daily.  3. Lisinopril 20 mg daily.  4. Actos 30 mg daily.  5. Fosrenol one and a half tablet before each meal.  6. Lantus 25 units subcu at bedtime.  7. Norvasc 10 mg everyday.  8. Epogen 10,000 units subcu every week.  9. The patient was given a prescription for Vicodin 5/325 one to two      p.o. q.4 h. p.r.n. pain.   DISCHARGE INSTRUCTIONS:  The patient may resume a carb-modified renal  diet.  She may increase her activity slowly and walk.  She may shower.  She is  to call our office for fever greater than 101.5, worsening pain  or worsening drainage from her wound.  For her shower, she is to remove  her packing prior to the home health agency  coming out to do her dressing change.  She is going to shower and leave  this open to air dry until the agency arrives to repack this.  Otherwise, she is to return to see Dr. Marlou Starks in 1-2 weeks for followup.  Otherwise, she was given instructions per the renal doctors for followup  with them.      Henreitta Cea, PA      Earnstine Regal, MD  Electronically Signed    KEO/MEDQ  D:  04/25/2009  T:  04/26/2009  Job:  [id number]   cc:   Sammuel Hines. Daiva Nakayama, M.D.  Rigoberto Noel, MD  Elzie Rings Lorrene Reid, M.D.

## 2011-02-16 NOTE — H&P (Signed)
NAMEMARSHAY, [name]            ACCOUNT NO.:  [id number]   MEDICAL RECORD NO.:  [id number]          PATIENT TYPE:  INP   LOCATION:  1828                         FACILITY:  [facility]   PHYSICIAN:  Sol Blazing, M.D.DATE OF BIRTH:  [birth year]   DATE OF ADMISSION:  01/20/2008  DATE OF DISCHARGE:                              HISTORY & PHYSICAL   REASON FOR ADMISSION:  Abdominal pain, fevers, and cloudy peritoneal  fluid.   HISTORY:  This is a 35 year old [place] American female on CAPD for 6  years.  She does 4 exchanges a day and has had only 1 or 2 episodes of  peritonitis over the last 6 years.  Over the last couple of weeks, she  has been having catheter problems with a leaky catheter and then some  difficulties with the catheter hardware slipping off the tube.  She was  put on some oral Keflex for a possible breach in sterility and today  developed abdominal pain this morning and a subjective fever through the  day.  The peritoneal fluid then turned cloudy this afternoon.  She  presents to the emergency room with these complaints.  In the emergency  room, she is found to have a systemic blood sugar of 1000 and cloudy PD  fluid.  She is in no acute distress.  She has complained of intermittent  abdominal pain.  She has already received 6 mg of morphine IV.   PAST MEDICAL HISTORY:  1. ESRD and CAPD for 6 years  2. Diabetic nephropathy.  She does 4 exchanges a day, 3000 mL dwell.  3. Diabetes, insulin dependent.  4. Hypertension.   MEDICATIONS:  1. Unknown doses lisinopril.  2. Epogen  3. Protonix  4. KCl.  5. 5 units of Humalog with each meal.  6. Lantus 10 units at night.   SOCIAL HISTORY:  She is engaged, to be married next year.  Her fiance is  with her today, but no alcohol or drug use.  No tobacco.   REVIEW OF SYSTEMS:  CONSTITUTIONAL:  No no shortness of breath, cough,  hemoptysis.  Denies chest pain.  GI:  As above and some diarrhea.  Not  much nausea or  vomiting.  GU:  Does not make urine.  MUSCULOSKELETAL:  No myalgia or arthralgia.  She does have ankle swelling.  NEUROLOGIC:  She has no focal numbness or weakness.  PSYCHIATRIC:  No hallucination,  symptoms of depression, or anxiety.   PHYSICAL EXAMINATION:  VITAL SIGNS:  Temperature 102.6, blood pressure  191/106, pulse 110, respirations 20, and O2 sat are present on room air.  GENERAL:  The patient is falling sleep off and on, but easily awakens  and is alert and talkative and oriented.  SKIN:  Warm and dry.  HEENT:  PERRLA, EOMI.  There is periorbital edema.  Throat is clear.  NECK:  Supple with flat neck veins.  CHEST:  Clear throughout.  No rales or wheezing.  CARDIAC:  Regular rate and rhythm without murmur or gallop.  ABDOMEN:  Soft, obese, and nontender.  There is a left lower quadrant PD  cath with  a clean exit site.  EXTREMITIES:  2+ pitting edema from the feet to the knees bilaterally.  NEUROLOGIC:  Alert and oriented x3.  No asterixis.  Nonfocal motor exam.   LABORATORY DATA:  Sodium 118, BUN 28, creatinine 10.4, chloride 85, and  these are all i-STAT values.  Potassium 4.5, blood sugar greater than  700 with confirmation levels close to a 1000.  White blood count 12,000,  hemoglobin 8.4, platelets normal.  Abdominal x-ray, nonspecific bowel  gas pattern.  Chest x-ray AP, bibasilar atelectasis.   IMPRESSION:  1. Tenckhoff-related acute peritonitis with recent breach of catheter      protocol related to hardware problems.  2. Severe hyperglycemia with history of diabetes on insulin-dependent      diabetes.  3. Hyponatremia, which is pseudohyponatremia due to hyperglycemia.  4. Anemia.  5. Hypertension.  6. Volume overload with significant edema with a an elevated blood      pressure.   PLAN:  We will send infected PD fluid for cell count, Gram-stain and  culture, do in and out exchanges x2 in the emergency room.  Given  empiric intravenous vancomycin and Fortaz for  peritonitis.  For volume  overload, we are going to ultrafiltrate with CAPD every 4 hours for now  (her usual is 4 times a day) alternating 4.25 and 2.5% solution.  She  needs a IV insulin drip and admission to ICU for blood sugar control.  Blood cultures have already been done.      Sol Blazing, M.D.  Electronically Signed     RDS/MEDQ  D:  01/20/2008  T:  01/21/2008  Job:  [id number]

## 2011-02-16 NOTE — H&P (Signed)
NAMEDAINE, SHEARON NO.:  [id number]   MEDICAL RECORD NO.:  [id number]          PATIENT TYPE:  INP   LOCATION:  2114                         FACILITY:  [facility]   PHYSICIAN:  Sammuel Hines. Daiva Nakayama, M.D. DATE OF BIRTH:  05-04-1976   DATE OF ADMISSION:  04/15/2009  DATE OF DISCHARGE:                              HISTORY & PHYSICAL   [name] is a 35 year old black female who began having some pain  and swelling in her left perineal area on Saturday.  She went to her  medical doctor on Monday who attempted to incise and drain it but she  had no improvement.  It has progressed since then.  She has had more  drainage and odor from the area and more pain.  She denies any fevers,  chills, chest pain, shortness of breath, diarrhea, or dysuria.  Her  other review of systems are unremarkable.   PAST MEDICAL HISTORY:  Significant for:  1. Diabetes.  2. Hypertension.  3. Renal failure, she gets peritoneal dialysis at home.   PAST SURGICAL HISTORY:  Significant for tubal ligation, peritoneal  dialysis, and catheter replacement.   MEDICATIONS:  Potassium, Actos, lisinopril, Fosrenol, Norvasc, and  Epogen.   ALLERGIES:  No known drug allergies.   SOCIAL HISTORY:  She denies use of alcohol or tobacco products.   FAMILY HISTORY:  Noncontributory.   PHYSICAL EXAMINATION:  VITAL SIGNS:  Temperature is 98.4, blood pressure  97/49, and pulse 77.  GENERAL:  She is well-developed, well-nourished black female in no acute  distress.  SKIN:  Warm and dry.  No jaundice.  EYES:  Extraocular muscles are intact.  Pupils equal, round, and  reactive to light.  Sclerae nonicteric.  LUNGS:  Clear bilaterally with no use of accessory respiratory muscles.  HEART:  Regular rate and rhythm with an impulse in left chest and a  systolic murmur.  ABDOMEN:  Soft and nontender.  Peritoneal dialysis catheter is intact.  EXTREMITIES:  No cyanosis, clubbing, or edema.  Good strength in arms  and  legs.  PSYCHOLOGICAL:  She is alert and oriented x3 with no evidence of anxiety  or depression.  GENITOURINARY:  In her left perineal and labial area, she has a large  necrotic abscess with foul-smelling drainage.   On review of her lab work, her white count is 26,000 and glucose 572.  CT was done that showed a large gas-filled area in the left perineum.   ASSESSMENT AND PLAN:  This is a 35 year old black female with a large  abscess on the left perineal and labial area.  This will need to be  incised and drained in the operating room.  I have discussed with her in  detail the risks and benefits of the operation to do this as well as  some of the technical aspects.  She understands and wishes to proceed.  We will plan to do this tonight in the operating room.      Sammuel Hines. Daiva Nakayama, M.D.  Electronically Signed     PST/MEDQ  D:  04/16/2009  T:  04/16/2009  Job:  [id number]

## 2011-02-16 NOTE — Op Note (Signed)
NAMEANNISTYN, PALE            ACCOUNT NO.:  [id number]   MEDICAL RECORD NO.:  [id number]          PATIENT TYPE:  AMB   LOCATION:  SDS                          FACILITY:  [facility]   PHYSICIAN:  Judeth Cornfield. Scot Dock, M.D.DATE OF BIRTH:  30-Aug-1976   DATE OF PROCEDURE:  05/08/2009  DATE OF DISCHARGE:  05/08/2009                               OPERATIVE REPORT   PREOPERATIVE DIAGNOSIS:  Chronic kidney disease.   POSTOPERATIVE DIAGNOSIS:  Chronic kidney disease.   PROCEDURE:  Placement of a new left upper arm loop AV graft.   SURGEON:  Judeth Cornfield. Scot Dock, MD   ASSISTANT:  Chad Cordial, PA   ANESTHESIA:  Local with sedation.   TECHNIQUE:  The patient was taken to the operating room, sedated by  Anesthesia.  The left upper extremity was prepped and draped in the  usual sterile fashion.  After the skin was infiltrated with 1%  lidocaine, I made a transverse incision just above the antecubital level  and here the cephalic vein was dissected free and it was a fairly small  vein and it was ligated and then I attempted to irrigate it up with  heparinized saline.  However, it would not even take a smallest dilator.  This was therefore ligated.  Next, the artery was dissected free and was  fairly small.  Upon interrogation of the Doppler, it was clear that the  patient had a high bifurcation of the brachial artery.  The adjacent  veins were also quite small.  For all these reasons, I selected to place  an upper arm loop AV graft.  A separate longitudinal incision was made  beneath the axilla after the skin was anesthetized, and the high  brachial vein and brachial artery were dissected free.  A 4- to 7-mm  graft was then tunneled in the upper arm.  The patient was then  heparinized.  The brachial artery was clamped proximally and distally,  and a longitudinal arteriotomy was made.  A segment of the 4-mm end of  the graft was excised.  The graft was spatulated and sewn end to side  to  the artery using continuous 6-0 Prolene suture.  The graft was then  pulled to the appropriate length for anastomosis to the high brachial  vein.  The vein was ligated distally and spatulated proximally.  The  graft was cut to the appropriate length, spatulated, sewn end to end to  the vein using continuous 6-0 Prolene suture.  At the completion, there  was an excellent thrill in the graft and a palpable radial pulse.  Hemostasis was obtained in the wounds.  The wounds were closed with deep  layer of 3-0 Vicryl, the skin closed with 4-0 Vicryl.  Sterile dressing  was applied.  The patient tolerated the procedure well and was  transferred to the recovery room in stable condition.  All needle and  sponge counts were correct.      Judeth Cornfield. Scot Dock, M.D.  Electronically Signed     CSD/MEDQ  D:  05/08/2009  T:  05/09/2009  Job:  [id number]

## 2011-02-16 NOTE — Assessment & Plan Note (Signed)
OFFICE VISIT   ARLYCE, WOLLARD L  DOB:  1975/11/29                                       05/29/2009  [id number]   I saw the patient in the office today to check on her incision in the  left arm.  She had a left upper arm loop AV graft placed on May 08, 2009.  She had some slight separation of the incision just above the  antecubital level, and I was asked to evaluate this.   On examination she has excellent bruit and thrill in her upper arm  graft, and her axillary incision has healed.  The antecubital incision  has some very slight separation, but there is no drainage and I think  this will heal without any problem.  I have simply instructed her to  keep this clean and dry with soap and water and to let us know if this  does not heal.   Judeth Cornfield. Scot Dock, M.D.  Electronically Signed   CSD/MEDQ  D:  05/29/2009  T:  05/30/2009  Job:  [id number]

## 2011-02-16 NOTE — Procedures (Signed)
CEPHALIC VEIN MAPPING   INDICATION:  Preop for AV fistula.   HISTORY:  End-stage renal disease.   EXAM:  The right cephalic vein is compressible.   Diameter measurements range from 0.28 to 0.14 cm.   The left cephalic vein is compressible.   Diameter measurements range from 0.40 to 0.14 cm.   See attached worksheet for all measurements.   IMPRESSION:  Patent bilateral cephalic veins which are acceptable  diameter for use as a dialysis access site.   Branch noted on the left mid to distal forearm.       ___________________________________________  Judeth Cornfield. Scot Dock, M.D.   AC/MEDQ  D:  10/08/2008  T:  10/08/2008  Job:  [id number]

## 2011-02-16 NOTE — Consult Note (Signed)
VASCULAR SURGERY CONSULTATION   [name]  DOB:  1976/03/26                                       10/08/2008  [id number]   I saw [name] in the office today in consultation for hemodialysis  access.  This is a pleasant 35 year old right-handed woman with chronic  kidney disease secondary to diabetes.  She has been on peroneal dialysis  for approximately 7 years.  Recently, she has had some problems with her  catheter, and it was felt that she might benefit from having a graft or  fistula placed in case she needed hemodialysis.  Of note, she is also on  the transplant list.  She has had no recent uremic symptoms.  Specifically, she denies any history of nausea, vomiting or significant  shortness of breath.   PAST MEDICAL HISTORY:  1. Diabetes.  2. Chronic kidney disease.  3. Hypertension.   SOCIAL HISTORY:  She is single.  She does not smoke cigarettes.   REVIEW OF SYSTEMS:  Significant for reflux and a history of anemia.  Cardiac, pulmonary, GU, vascular, neurologic review of systems are  unremarkable.   PHYSICAL EXAMINATION:  GENERAL:  This is a pleasant 32-year woman who  appears her stated age.  VITAL SIGNS:  Blood pressure is 146/83, heart rate is 76, temperature is  97.8.  LUNGS:  Clear bilaterally to auscultation.  CARDIAC:  She has a regular rate and rhythm.  EXTREMITIES:  She has a palpable brachial and radial pulse bilaterally.  I do not see a usable cephalic vein in either arm on exam.   She did have a vein map in the office today which shows very small  cephalic veins in the upper arms and forearms bilaterally.   I have explained I think she would require an AV graft for diet for  hemodialysis.  We have discussed the indications for surgery and the  potential complications including, but not limited to, bleeding, steal  syndrome, graft thrombosis and graft infection.  All of her questions  were answered.  She would like  to discuss this further with Dr.  Marval Regal and her family before agreeing to proceed with the surgery.  She would need a left arm AVG, as I do not think she is a candidate for  a fistula.   Judeth Cornfield. Scot Dock, M.D.  Electronically Signed  CSD/MEDQ  D:  10/08/2008  T:  10/15/2008  Job:  [id number]

## 2011-02-16 NOTE — Consult Note (Signed)
NEW PATIENT CONSULTATION   [name]  DOB:  1975/12/25                                       10/08/2008  [id number]   I saw [name] in the office today in consultation for hemodialysis  access.  This is a pleasant 35 year old right-handed woman with chronic  kidney disease secondary to diabetes.  She has been on peroneal dialysis  for approximately 7 years.  Recently, she has had some problems with her  catheter, and it was felt that she might benefit from having a graft or  fistula placed in case she needed hemodialysis.  Of note, she is also on  the transplant list.  She has had no recent uremic symptoms.  Specifically, she denies any history of nausea, vomiting or significant  shortness of breath.   PAST MEDICAL HISTORY:  1. Diabetes.  2. Chronic kidney disease.  3. Hypertension.   SOCIAL HISTORY:  She is single.  She does not smoke cigarettes.   REVIEW OF SYSTEMS:  Significant for reflux and a history of anemia.  Cardiac, pulmonary, GU, vascular, neurologic review of systems are  unremarkable.   PHYSICAL EXAMINATION:  GENERAL:  This is a pleasant 35-year woman who  appears her stated age.  VITAL SIGNS:  Blood pressure is 146/83, heart rate is 76, temperature is  97.8.  LUNGS:  Clear bilaterally to auscultation.  CARDIAC:  She has a regular rate and rhythm.  EXTREMITIES:  She has a palpable brachial and radial pulse bilaterally.  I do not see a usable cephalic vein in either arm on exam.   She did have a vein map in the office today which shows very small  cephalic veins in the upper arms and forearms bilaterally.   I have explained I think she would require an AV graft for diet for  hemodialysis.  We have discussed the indications for surgery and the  potential complications including, but not limited to, bleeding, steal  syndrome, graft thrombosis and graft infection.  All of her questions  were answered.  She would like to  discuss this further with Dr.  Marval [name] and her family before agreeing to proceed with the surgery.  She would need a left arm AVG, as I do not think she is a candidate for  a fistula.   [name]. [name], M.D.  Electronically Signed   CSD/MEDQ  D:  10/08/2008  T:  10/09/2008  Job:  [id number]   cc:   [name], M.D.

## 2011-02-16 NOTE — Discharge Summary (Signed)
NAMEKYLEE, [name]            ACCOUNT NO.:  [id number]   MEDICAL RECORD NO.:  [id number]          PATIENT TYPE:  INP   LOCATION:  [id number]                          FACILITY:  [facility]   PHYSICIAN:  Anselmo Pickler, DO    DATE OF BIRTH:  [birth year]   DATE OF ADMISSION:  08/13/2008  DATE OF DISCHARGE:  11/11/2009LH                               DISCHARGE SUMMARY   ADMISSION DIAGNOSES:  1. Acute lower left quadrant pain, rule out spontaneous bacterial      peritonitis.  2. Rule out Clostridium difficile.  3. Chronic end-stage renal insufficiency on peritoneal dialysis.  4. Hypertension.  5. Diabetes.   DISCHARGE DIAGNOSES:  1. Abdominal pain which was resolved.  2. End-stage renal disease.  The patient is on peritoneal dialysis.  3. Hyponatremia.  4. Hypomagnesemia.  5. Hyperglycemia.   HOSPITAL COURSE:  Her H and P was done by Dr. __________; please refer.  Radiology tests that were done include a CT of the abdomen and pelvis  which demonstrated no clear abdominal process nor organized fluid  collection to suggest abscess, large volume of intraperitoneal free  fluid and free air related to peritoneal dialysis.  The pelvis had large  blood free fluid related to peritoneal dialysis, likely functional  ovarian cyst on the left, and mild inguinal adenopathy slightly  increased from prior.   For hospital course, the patient was admitted with the above findings.  She started on antibiotic therapy.  She continued to improve for her  abdominal pain.  After cytology analysis, it was determined she did not  have an infection, and so the patient had stated that today she would  like to go home, and based on that, we went ahead and started discharge  proceedings.  Also, too, she has end-stage renal disease with peritoneal  dialysis.  She does this herself.  She mentioned that today is her last  day of being able to do this which she does at 5:00, and she does not  have any other supplies, as  the hospital does not supply it for her, and  she would like to go home.   For hyponatremia, she was noted to be hyponatremia while she was in the  hospital.  At discharge, her discharge instructions will encourage her  to be free with sodium chloride and then have her PCP correct it.   For her hypomagnesemia, will go ahead and provide supplementation for  her to take.   For her hyperglycemia, also her Lantus dose is up to 30 units  subcutaneous.  Will have her PCP follow-up to see if this is improved  over the next couple of days.   The patient stated understanding for immediate follow-up with her PCP,  Dr. Truett Mainland.  Her condition is stable.  Her discharge will be to home.  Will supplement her with magnesium tablets 400 mg 1 p.o. b.i.d. for 10  days.  Her condition is stable.  Her disposition will be to home.      Anselmo Pickler, DO  Electronically Signed     CB/MEDQ  D:  08/14/2008  T:  08/14/2008  Job:  [id number]

## 2011-02-16 NOTE — H&P (Signed)
[name], [name]            ACCOUNT NO.:  [id number]   MEDICAL RECORD NO.:  [id number]          PATIENT TYPE:  INP   LOCATION:  [id number]                          FACILITY:  [facility]   PHYSICIAN:  Jimmey Ralph, MD  DATE OF BIRTH:  [birth year]   DATE OF ADMISSION:  08/13/2008  DATE OF DISCHARGE:  LH                              HISTORY & PHYSICAL   PRIMARY CARE PHYSICIAN:  Dr. Truett Mainland.   HISTORY OF PRESENT ILLNESS:  A 35 year old African-American female  patient came into the ED with chief complaint of left lower abdominal  pain.  The patient states that the pain awakened her about an hour ago  and started with left lower abdominal pain.  The pain she describes is  crampy in nature and woke her up from sleep.  It is 10/10 in intensity,  not radiating, no aggravating revealing factors.  Complains of one  episode of loose stools earlier this evening with no bladder mucus in  the stool.  The patient has been on peritoneal dialysis for last 7 years  and has had episodes of peritonitis.  Last episode was April this year.  Usually with the peritonitis, the patient has complaints of fever and  her peritoneal bag turns cloudy as per the patient.  However, at this  time, this has been just an abdominal pain with no change and no  complaints of fever or no change in her peritoneal fluid bag color.  The  patient then denies any complaints of fever.  No complaints of nausea or  vomiting.  No complaints of back pain.  The patient does not have urine  output.  She has been on peritoneal dialysis.  She follows up with Dr.  Marval Regal, her nephrologist in [place].   PAST MEDICAL HISTORY:  History of hypertension, diabetes, and end-stage  renal disease, on peritoneal dialysis.   ALLERGIES:  No known drug allergies.   PAST SURGICAL HISTORY:  Has been of cesarean section and tubal ligation.   SOCIAL HISTORY:  Nonsmoker, nondrinker.  No history of any IV drug use.   FAMILY HISTORY:  Diabetes in  her mother, grandmother, and her daughter.  Travel, no history of recent air travel, no history of domestic travel.  Last menstrual period has been July 30, 2008.   MEDICATIONS AT HOME:  1. Protonix.  2. Potassium chloride.  3. NovoLog.  4. Lantus 20 unit subcu nightly.  5. Epogen.  6. Labetalol.  7. Norvasc.   REVIEW OF SYSTEMS:  HEENT:  Unremarkable.  CARDIOVASCULAR:  Unremarkable.  RESPIRATORY:  Negative for cough, shortness of breath, dyspnea,  wheezing.  GASTROINTESTINAL:  As described above, complains of abdominal pain and  diarrhea.  No nausea or vomiting.  GENITOURINARY:  Negative for dysuria and flank pain.  MUSCULOSKELETAL:  Negative for any arthralgias, back pains, neck pains.  SKIN:  Negative for any rash.  NEURO:  Negative for any complaints of headache or weakness.  PSYCHIATRIC:  Negative for depression.   PHYSICAL EXAMINATION:  VITAL SIGNS:  Blood pressure 122/76, heart rate  of 76, respiration 18, and temperature 97.2.  HEENT:  Normocephalic.  No pallor.  Pupils equal and reacting to light.  No lymphadenopathy. Nop drift.  RESPIRATORY:  Air entry is bilaterally equal.  No rales.  No rhonchi.  BREAST:  Bilateral vesicular breath sounds.  CARDIOVASCULAR:  Regular rate and rhythm.  S1 and S2 normal.  No  gallops.  No murmurs.  ABDOMEN:  Upper abdomen is soft, tenderness plus in the left lower  quadrant.  No guarding.  No rigidity.  Bowel sounds good.  No masses  felt.  No rebound tenderness.  EXTREMITIES:  No edema.  Pedal pulses are felt.  BACK:  No CVA tenderness.  No costovertebral angle tenderness.  NEURO:  The patient is alert, awake, oriented x3.  No focal neurological  deficits.  SKIN:  No evidence of any skin breakdown.   LABORATORY DATA:  Sodium 135, potassium 2.2, chloride 97, bicarb is 29,  glucose 153, BUN 23, creatinine 11.1, calcium of 8.2, total WBC count  7.2, hemoglobin 9.3, hematocrit 27.1, platelets 305, and neutrophils 57.   ASSESSMENT  AND PLAN:  1. Acute left lower quadrant pain to rule out spontaneous bacterial      peritonitis.  2. Rule out Clostridium difficile.  3. Chronic end-stage renal insufficiency on peritoneal dialysis.  4. Hypertension.  5. Diabetes.   PLAN:  To admit the patient under [facility] service to get a CT scan of  the abdomen and pelvis with p.o. and IV contrast until followup with the  results.  The patient also to get peritoneal fluid cultures and blood  cultures x2.  Monitor CBC daily.  The patient will need to start on  Zosyn with renal dosing and vancomycin 1 dose after the blood cultures  have been drawn.   We will continue on her Lantus and NovoLog to give Lantus 20 unit subcu  p.o. daily and with NovoLog sliding scale.   We will continue with her labetalol and Norvasc as per what she takes at  home.   Hypokalemia.  Potassium of 2.2.  We will get an EKG now and the patient  to start on IV potassium supplementation.  We does have telemetry  monitoring for the same.  We can give potassium chloride x4 doses IV 2  hours apart and we will also supplement  magnesium.   The patient will be needed to have a nephrology evaluation in a.m.   We will get the DVT and GI prophylaxis with the heparin and Protonix.      Jimmey Ralph, MD  Electronically Signed     MP/MEDQ  D:  08/13/2008  T:  08/13/2008  Job:  [id number]

## 2011-02-16 NOTE — Consult Note (Signed)
[name], [name]            ACCOUNT NO.:  [id number]   MEDICAL RECORD NO.:  [id number]          PATIENT TYPE:  INP   LOCATION:  [id number]                          FACILITY:  [facility]   PHYSICIAN:  Alison Murray, M.D.DATE OF BIRTH:  [birth year]   DATE OF CONSULTATION:  DATE OF DISCHARGE:                                 CONSULTATION   HISTORY:  She is a patient who is 35 year old with past medical history  of diabetes, end-stage renal disease, and COPD.  Presently, came to the  emergency room with complaints of abdominal pain mainly on the left side  without any radiation.  According to the patient, she states that she  does not have any fever, no chills.  She does not have any nausea,  vomiting, and according to her, the PD fluid is also clear.  She has a  history of peritonitis and treated with antibiotics in April of this  year.  At this moment, her main complaint seems to be abdominal pain.  She denies any shortness of breath, dizziness, or lightheadedness.   PAST MEDICAL HISTORY:  She has a history of end-stage renal disease,  COPD, history of diabetic nephropathy, history of diabetes, history of  hypertension, and history of previous peritonitis.   MEDICATIONS:  1. Norvasc 10 mg p.o. daily.  2. She is also on Fortaz 0.5 mg IV q.24 h.  3. She is on heparin 5000 units subcu q.8 h.  4. She is also on Lantus insulin.  5. Normodyne 100 mg p.o. b.i.d.  6. Protonix 40 mg p.o. daily.   OTHER MEDICATIONS:  P.r.n. medications.   ALLERGY:  No known allergy.   SOCIAL HISTORY:  No history of smoking or alcohol abuse.   REVIEW OF SYSTEMS:  She denies any fever, chills, or sweating.  She does  not have any nausea or vomiting.  Her main complaint seems to be  abdominal pain, mainly on the left flank area.  It started yesterday,  still even at this moment she does not have any improvement.   PHYSICAL EXAMINATION:  GENERAL:  The patient is alert and in no apparent  distress.  VITAL  SIGNS:  Her temperature is 98.1, blood pressure 150/97, and heart  rate of 82.  CHEST:  Clear to auscultation.  No rubs.  No rhonchi.  HEART:  Regular rate and rhythm.  EXTREMITIES:  No edema.  ABDOMEN:  She has some tenderness on the right flank.  No rebound  tenderness.  The other part of the abdomen seems to be nontender.   LABORATORY DATA:  Her white blood cell count is 7.2, hemoglobin 9.3, and  hematocrit 27.1.  Sodium 145, potassium 2.2, chloride 97, BUN 23, and  creatinine 11.   ASSESSMENT:  1. End-stage renal disease.  The patient is on chronic obstructive      pulmonary disease.  Presently, her potassium is low and she does      not have any sign of fluid overload.  2. History of abdominal pain mainly on the right side with some      tenderness.  At this moment, it does not  look peritonitis, but      cannot rule out now.  Her white blood cell count is normal.  She is      also afebrile.  3. Hypokalemia.  4. History of hypertension.  Her blood pressure seems to be      reasonable.  She is on Norvasc and also she is on labetalol.   RECOMMENDATIONS:  We will give her potassium 40 mEq p.o. 1 dose since  she has also received some potassium and if she cannot tolerate probably  we will start her on low dose of potassium with IV fluid.  We will check  her PD fluid for Gram stain culture and also cell count.  Since we do  not have any PD fluid here, if the patient is allowed to bring her PD,  she can continue to do CAPD.  If it is not allowed, then the patient may  need to go to [place].      Alison Murray, M.D.  Electronically Signed     BB/MEDQ  D:  08/13/2008  T:  08/13/2008  Job:  [id number]

## 2011-02-16 NOTE — Op Note (Signed)
[name]            ACCOUNT NO.:  [id number]   MEDICAL RECORD NO.:  [id number]          PATIENT TYPE:  AMB   LOCATION:  SDS                          FACILITY:  [facility]   PHYSICIAN:  Orson Ape. Weatherly, M.D.DATE OF BIRTH:  12-Apr-1976   DATE OF PROCEDURE:  05/15/2009  DATE OF DISCHARGE:                               OPERATIVE REPORT   PREOPERATIVE DIAGNOSIS:  Nonused CAPD catheter, double cuff.   ANESTHESIA:  General anesthesia.   PROCEDURE:  Removal of CAPD catheter.   SURGEON:  Orson Ape. Rise Patience, MD   ASSISTANT:  Nurse.   HISTORY:  [name] is a diabetic chronic renal failure patient  who 8 years ago had a CAPD catheter double cuff placed at the [facility] in [place] and has been on CAPD.  She was actually admitted here.  She lives in [place].  Approximately 6-8 weeks ago with a significant  infection in the perineal area, Dr. Marlou Starks drained that large abscess,  debrided, and excised some necrotic tissue.  This has gradually healed.  Her dialysis was not doing well and they switched her to hemodialysis  during that admission.  I followed her in the office.  She has got a  CAPD catheter in the left lower quadrant that has had problems with exit  site infection.  The external cuff was excised with Dr. Excell Seltzer  probably about a year half ago.  There is no evidence of an active  infection of the abdominal wall.  She has not used the catheter and it  has not been flushed for approximately 4 weeks.  She is here for removal  of this catheter.  The cultures previously Pseudomonas covered and she  was given 400 mg of Cipro prior to induction of general anesthesia.  She  was positioned on the OR table and as she was being positioned, I  checked the perineal groin area.  This large area has now contracted  down so that there is clean granulation tissues about a 4-inch area that  is nearly closed and no surgical needs are needed in the groin.  There  are no  other areas of active infection.  She was then induced with  general anesthesia in a LOA tube.  The abdomen was prepped with Betadine  solution and draped in a sterile manner.  You can see where the old  external cuff has been previously excised and the wounds are contracted.  I have no records on whether this was a [place], [place], or  just a double cuff CAPD catheter, so I elected to after draping her  sterilely make a little incision where the catheter had been inserted  and then go down and find the catheter in the subcutaneous tissue and  then track it down.  It appears that this was a double cuff standard  type or CAPD catheter.  The internal cuff was really right at the  external fascia level and I opened it and then placed a pursestring  suture on the little peritoneal pseudo cuff or tube around the catheter,  aspirated, and did a culture aerobic and anaerobic.  We then removed  probably five 600 mL of old dialysis fluid from the peritoneal cavity.  The suture around the little sheath, the internal portion of the  peritoneal pseudo tube was closed with a 2-0 Vicryl, a second suture  placed.  I then invaginated this and used 0-Prolene in the external  fascia for a good closure and hopefully she will not get abdominal wall  hernia.  Three sutures were placed, no evidence of any further leakage,  and then I closed the little subcutaneous pocket with 3-0 chromic and  then 5-0 nylon simple sutures on the skin.  I then removed this external  kind of tube through a little small separate incision and then that  little exit site was closed with 2 simple sutures of 5-0 nylon.  The  patient tolerated procedure well and I did place about 5 or 6 mL of  Marcaine in this small incision.  She will be released postoperatively.  I am going to continue her on the Septra, which she has been on daily  and see her back in the office in approximately 10 days.  The peroneal wound is closing  nicely and should just contract and not  require any additional surgery.  Her potassium was 3.2 preoperatively  under short anesthesia.  I think she can be released and continue on her  chronic diabetic medications.      Orson Ape. Rise Patience, M.D.  Electronically Signed     Orson Ape. Rise Patience, M.D.  Electronically Signed    WJW/MEDQ  D:  05/15/2009  T:  05/15/2009  Job:  [id number]

## 2011-02-19 NOTE — Discharge Summary (Signed)
[name], [name]                      ACCOUNT NO.:  [id number]   MEDICAL RECORD NO.:  [id number]                   PATIENT TYPE:  INP   LOCATION:  [id number]                                 FACILITY:  [facility]   PHYSICIAN:  [name], M.D.             DATE OF BIRTH:  02/26/1976   DATE OF ADMISSION:  06/25/2003  DATE OF DISCHARGE:  06/28/2003                                 DISCHARGE SUMMARY   ADMISSION DIAGNOSES:  1. Abdominal pain.  2. Pancreatitis.  3. Urinary tract infection.  4. Hyperkalemia.  5. Hypocalcemia.   DISCHARGE DIAGNOSES:  1. Abdominal pain.  2. Pancreatitis.  3. Urinary tract infection.  4. Hyperkalemia.  5. Hypocalcemia.   CHRONIC PROBLEM LIST:  1. Diabetes type 2.  2. Hypertension.  3. End-stage renal disease on peritoneal dialysis.   DISCHARGE MEDICATIONS:  1. Lisinopril 20 mg p.o. b.i.d.  2. Prandin 2 mg p.o. before each meal.  3. Labetalol 200 mg 1.5 tablets in the a.m.; 1 tablet in the p.m.  4. Calcitrol 0.25 mg twice a day.  5. Tums 1 tablet twice a day.  6. Ciprofloxacin 500 mg b.i.d. as taken prior to admission.   CONSULTATION:  Nephrology, [name], M.D.  Evaluated the patient on  June 26, 2003.   PROCEDURES:  1. Abdominal ultrasound-showed a mild amount of preperitoneal fluid.  No     gallstones or signs of cholecystitis were seen.  Pancreas could not be     visualized secondary to bowel gas.  2. Peritoneal fluid analysis.  This was done by Dr. Lowanda [name] and this     revealed no sign of infection.   HISTORY AND PHYSICAL:  For complete History and Physical, please see  dictated H&P on chart.  Briefly, this was a 35 year old black female with  end-stage renal disease, on continuous peritoneal dialysis, who presented  with a several day history of abdominal pain.  She developed nausea and  vomiting.  The day prior to presentation, she did have several loose watery  bowel movements.  She had no fever.  Initial  examination revealed  hypertension at 160/133.  The patient was in mild distress, holding her  abdomen intermittently.  Abdominal exam revealed diffuse epigastric  tenderness with slight guarding with no rebound.  Bowel sounds were  diminished.  Laboratory results showed a white blood cell count of 16,900  and a lipase of 112.  Liver enzymes were normal.  BUN 72, creatinine 10.  Abdominal x-rays showed several small dilated bowel loops with a couple of  air fluid levels.   Initial evaluation was consistent with possible early pancreatitis versus  gastroenteritis.  There was also the worry of possible peritonitis, given  her continuous ambulatory peritoneal dialysis status.  She was admitted,  placed on telemetry, given antiemetics and pain medicine and made NPO.  Of  note, urinalysis in the emergency room upon initial evaluation did show  signs of  urinary tract infection, so she was started on ciprofloxacin.   HOSPITAL COURSE:  PROBLEM #1:  Abdominal pain.  The patient continued to  have significant epigastric abdominal pain early in the hospitalization with  nausea and vomiting that was relatively well controlled with antiemetics and  morphine.  She continued to have epigastric tenderness, but abdominal exam  was not consistent with an acute abdomen.  Peritoneal fluid analysis was  done by Dr. Lowanda [name] and this revealed no signs of infection.  Repeat of  lipase level showed a normalization of this value and liver enzymes remained  normal.  The patient had no diarrhea during the hospitalization.  She  gradually began to improve from abdominal pain and nausea standpoint and was  gradually weaned off of antiemetics and pain medicine.  She was able to  tolerate a regular diet prior to discharge.  I believe this illness was due  to gastroenteritis or possibly pancreatitis.  I do not think her urinary  tract infection can explain all of these symptoms.  Of note, I did check H.  pylori prior  to discharge and this came back negative.   PROBLEM #2:  Renal:  The patient was noted to have a normal potassium level  on admission but this increased to 5.7 on the day after admission.  Her BUN  and creatinine remained at about 75 and 10.4 respectively.  Dr. Lowanda [name]  evaluated [name], whom he is familiar with, and recommended  continuing her ambulatory peritoneal dialysis and also recommended checking  her peritoneal fluid for signs of infection.  He did take a sample of this  and as stated before, this showed no signs of infection.  She had no signs  of volume overload during this hospitalization.  Her potassium normalized to  4.8 prior to discharge.  Of note, her calcium on admission was 7.2 with an  albumin of 2.3 and has corrected to about 7.9.  In view of these results,  she was started on Rocaltrol 0.25 mg p.o. b.i.d. and one Tums tablet p.o.  b.i.d.   PROBLEM #3:  Urinary tract infection.  This was diagnosed on the day prior  to her admission, when she was seen in the ER initially.  She was started on  ciprofloxacin.  This was discontinued during the hospitalization because the  patient was initially placed on IV antibiotics (Rocephin).  However, at  discharge, she was placed back on her ciprofloxacin to complete a seven day  course.   PROBLEM #4:  Neurologic complaints.  On the second day of hospitalization,  in the evening, the patient complained of right upper extremity numbness,  cramping, weakness and blurry vision.  These symptoms lasted approximately  15 to 30 minutes.  At that time, her glucose was checked and it was 88.  These symptoms resolved with administration of Dextrose solution.  I believe  that her neurologic symptoms were secondary to relative hypoglycemia.  The  next evening, the patient had right arm cramping complaints, but no numbness  or weakness and no vision complaints.  This was treated successfully with Tylenol.  She had no further  neurologic complaints.   DISPOSITION:  The patient was discharged to home in an improved condition  and on the previously mentioned discharge medications.  She was instructed  to gradually increase her diet as tolerated and there were no restrictions  placed on activity.  She was instructed to call our office if any return of  significant abdominal pain or  intractable vomiting, and she was instructed  to keep her appointments with Dr. Lowanda [name].    PERTINENT LABORATORY DATA:  White blood cell count on admission 16,900, this  decreased to 11,000 prior to discharge.  Hemoglobin 15.6 on admission,  decreased to 11.7 prior to discharge and remained stable.        ___________________________________________                                         [name], M.D.   PHM/MEDQ  D:  07/11/2003  T:  07/11/2003  Job:  [id number]

## 2011-02-19 NOTE — H&P (Signed)
[name], [name]                      ACCOUNT NO.:  [id number]   MEDICAL RECORD NO.:  [id number]                   PATIENT TYPE:  EMS   LOCATION:  ED                                   FACILITY:  [facility]   PHYSICIAN:  Micah Flesher. Halm, D.O.                DATE OF BIRTH:  1976/01/18   DATE OF ADMISSION:  09/01/2003  DATE OF DISCHARGE:                                HISTORY & PHYSICAL   CHIEF COMPLAINT:  Abdominal pain, high potassium.   BRIEF HISTORY:  The patient is a 35 year old female with a history of  chronic renal failure who is on peritoneal dialysis who presents to the  emergency room at [facility] with a two- to three-day history of  abdominal pain, vomiting, and chills.  She started to get sick four days ago  with some cough and general congestion for which she took some Tylenol Cold.  She proceeded to have frequent and recurrent abdominal pains which are  generalized as well as vomiting at home.  She was traveling over the last  couple of days and recently today made her way back to [facility] for an  evaluation in the emergency room.  In the emergency room she was noted to  have a potassium of 6.5 with peaked T-waves on EKG.  She received a dose of  calcium gluconate intravenously as well as D-50 and 10 units of insulin.  She also was noted to be quite hypertensive with blood pressures in the [id number]  systolic range.  She is admitted to the hospital for further management of  her hyperkalemia, monitoring of her cardiac status, and evaluation of her  vomiting.   REVIEW OF SYSTEMS:  She has had some mild URI symptoms, mainly cough.  She  has had no shortness of breath or chest pain.  She has had no GI symptoms  other than the vomiting and abdominal pain.  No GU symptoms.  She states  that she has been doing her dialysis regularly.  She has been having a  problem keeping her antihypertensive medicines down due to vomiting.   PAST MEDICAL HISTORY:  1. Type 2  diabetes, managed with medications orally.  2. Hypertension.  3. Chronic renal failure.   PAST SURGICAL HISTORY:  1. Peritoneal dialysis catheter placed in April 2003.  2. C-section 1996.  3. Bilateral tubal ligation 2000.   MEDICATIONS:  1. Labetalol 300 mg p.o. b.i.d.  2. Prandin 2 mg before each meal.  3. Lisinopril 40 mg p.o. b.i.d.  4. Calcitrol 1.25 mg in the morning and 1.5 mg in the evening.  5. Epogen 1000 units subcutaneously three times weekly.   ALLERGIES:  No known drug allergies.   SOCIAL HISTORY:  The patient is disabled.  Denies use of alcohol, tobacco,  or illegal drugs.  She has two children and one child who passed away in  infancy of a cardiac anomaly.  FAMILY HISTORY:  [name] has diabetes and arthritis.   PHYSICAL EXAMINATION:  VITAL SIGNS:  In the emergency room were initially  noted to be temperature of 97.6, blood pressure 180/127, pulse of 103,  respirations of 24.  GENERAL:  Upon my evaluation the patient was in mild distress.  She was  talkative but sleepy and easily aroused.  She complained of abdominal pain.  HEENT, NECK:  Evaluation shows the thyroid to be normal to palpation.  LUNGS:  Clear in both fields.  HEART:  Regular and mildly tachycardic.  ABDOMEN:  Mildly distended, diffusely tender, with no rebound.  EXTREMITIES:  Show no edema.  She has some mild macular changes of her lower  extremity but no breakdown of the skin.   LABORATORY STUDIES:  Of most concern is her initial potassium of 6.5.  Her  BUN is 68, creatinine 11.4, calcium 7.9.  Her carbon dioxide is low at 16,  and her sodium is 133.  Her WBC is elevated at 13,800 with 87% neutrophils.  Her hemoglobin is good at 15.8.  Liver function tests are normal.  Her  albumin is low at 2.4.   EKG shows a normal sinus rhythm with borderline tachycardia.  She does have  peaked T-waves, particularly in the precordial leads, V2 through V5, with no  evidence of other abnormalities or  dysrhythmia.   IMPRESSION AND PLAN:  1. Hyperkalemia:  We will continue to monitor her potassium closely,     continue her dialysis, and provide her with temporary Kayexalate p.o. and     additional insulin and D-50 if she needs it.  We will have to monitor her     closely in regards to her cardiac function due to the hyperkalemia.  2. Hypertension:  This is likely due to inability to keep her medications     down.  We will restart her on her oral medications with some antiemetic     and provide her with intravenous blood pressure control if needed.  3. Type 2 diabetes, which will be managed with sliding scale insulin while     she is in the hospital.  4. Renal status with renal failure:  We will continue her peritoneal     dialysis and consult Dr. Lowanda Foster for further management and assistance     with managing her electrolytes.   The overall care has been reviewed with the patient, and she is in agreement  at this time.     ___________________________________________                                         Micah Flesher. Cleta Alberts, D.O.   [facility]/MEDQ  D:  09/01/2003  T:  09/01/2003  Job:  [id number]

## 2011-02-19 NOTE — Op Note (Signed)
NAME:  [name], [name]                      ACCOUNT NO.:  [id number]   MEDICAL RECORD NO.:  [id number]                   PATIENT TYPE:  AMB   LOCATION:  DAY                                  FACILITY:  [facility]   PHYSICIAN:  R. Garfield Cornea, M.D.              DATE OF BIRTH:  1976-06-16   DATE OF PROCEDURE:  DATE OF DISCHARGE:                                 OPERATIVE REPORT   PROCEDURE:  Diagnostic esophagogastroduodenoscopy.   ENDOSCOPIST:  Cristopher Estimable. Rourk, M.D.   INDICATIONS FOR PROCEDURE:  The patient is a 35 year old lady with a recent  bout of upper abdominal pain, nausea and vomiting which precipitated  hospitalization.  Abdominal pain has resolved; however, she continues to  have intermittent postprandial nausea and vomiting.  EGD is now being done  to further evaluate her symptoms.  This approach has been discussed with the  patient at length.  The potential risks, benefits, and alternatives have  been reviewed; questions answered.  She is agreeable.  Please see my  August 26, 2003 H&P.   PROCEDURE NOTE:  O2 saturation, blood pressure, pulse and respirations were  monitored throughout the entire procedure.  Conscious sedation: Versed 3 mg  IV, Demerol 75 mg IV in divided doses.   INSTRUMENT:  Olympus video chip adult gastroscope.   FINDINGS:  Examination of the tubular esophagus revealed no mucosal  abnormalities.  The EG junction was easily traversed.   STOMACH:  The gastric cavity was empty.  It insufflated well with air.  A  thorough examination of the gastric mucosa including a retroflex view of the  proximal stomach and esophagogastric junction demonstrated a couple of  linear erosions along the greater curvature. The patient had a small hiatal  hernia.  Otherwise the gastric mucosa appeared normal.  The pylorus was  patent and easily traversed.   DUODENUM:  The bulb and the second portion appeared normal.   THERAPEUTIC/DIAGNOSTIC MANEUVERS:  None.   The  patient tolerated the procedure well and was reacted in endoscopy.   IMPRESSION:  1. Normal esophagus.  2. Small hiatal hernia and a couple of linear gastric erosions as described     above.  The remainder of the gastric mucosa appeared normal.  3. Normal D1 and D2.   DISCUSSION:  If she continues to have nausea and vomiting we need to be  concerned about an element of gastroparesis, erosions in her stomach may be  related to the trauma of vomiting.   RECOMMENDATIONS:  1. A solid phase gastric emptying study in the near future.  2. We will check H. pylori serologies.  3. Further recommendations to follow.      ___________________________________________                                            R.  Garfield Cornea, M.D.   RMR/MEDQ  D:  08/27/2003  T:  08/27/2003  Job:  [id number]   cc:   Tammi Sou, M.D.  [facility]  [place], [place] [address]  Fax: [phone]

## 2011-02-19 NOTE — Consult Note (Signed)
[name], [name]                      ACCOUNT NO.:  [id number]   MEDICAL RECORD NO.:  [id number]                   PATIENT TYPE:  INP   LOCATION:  A201                                 FACILITY:  [facility]   PHYSICIAN:  Alison Murray, M.D.             DATE OF BIRTH:  06-07-1976   DATE OF CONSULTATION:  07/29/2003  DATE OF DISCHARGE:                                   CONSULTATION   REASON FOR CONSULTATION:  End-stage on peritoneal dialysis.   [name] is a 35 year old African-American with past medical history of  hypertension, type 2 diabetes, end-stage renal disease on maintenance  peritoneal dialysis.  She presently came from home with complaints of  nausea, vomiting, and diarrhea during the night time.  The diarrhea is  watery without any mucosa or blood. The patient also denies any other person  having the problem.  The vomitus is clear fluid.  [name] was admitted  at the end of September with the same type of problem.  During that time the  patient was also found to have gastroenteritis with UTI and she was treated  with antibiotics and sent after she clinically improved.  Recently she came  back with the same problem, but the patient states that, at this moment she  also has severe abdominal pain associated with it.  She denies any fever,  but complains of some weakness.  She states that the PD fluid she drained  was clear and no pain when she is draining it; but she has epigastric pain  and also side pain when she is coughing or moving.   PAST MEDICAL HISTORY:  She has a history of hypertension, history of  preeclampsia, history of hyperkalemia, history of anemia, history of  diabetes type 2, history of hypertension, history of end-stage renal disease  thought to be secondary to diabetes, and history of tubal ligation.   ALLERGIES:  No known allergies.   MEDICATIONS AS AN OUTPATIENT:  1. Rocephin 1 g IV q.24h.  2. Prinivil 20 mg p.o. b.i.d.  3.  Tylenol 500 one p.r.n.  4. Dilaudid 4 mg p.o.  5. Phenergan 25 mg p.r.n.  6. The patient also used to be on Nu-Iron which is stopped presently.  7. She was also on Protonix 40 mg p.o. daily.  8. Lisinopril 20 mg p.o. b.i.d.  9. Labetalol also 200 mg p.o. b.i.d.  10.      Calcitrol 0.25 mg p.o. daily.  11.      She is also on TUMS.   SOCIAL HISTORY:  No history of smoking, no history of alcohol abuse.  The  patient lives at home.  No history of drug use.   REVIEW OF SYSTEMS:  The patient complains of mainly abdominal pain  especially when she moves.  She had some nausea, but no vomiting today.  She  has frequent watery diarrhea which seems to be better since this p.m.  No  chest pain, no shortness of breath.  No orthopnea or complaints of weakness.  Peritoneal fluid, according to her is clear.   PHYSICAL EXAMINATION:  GENERAL:  On examination the patient seems to be  lying down in no apparent distress.  VITAL SIGNS:  Her blood pressure is 167/70.  Temperature 100.  HEENT:  No conjunctival pallor. No icterus.  Oral mucosa seems to be  somewhat dry.  NECK:  Supple, no JVD.  CHEST:  Clear to auscultation.  HEART:  His heart exam revealed regular rate and rhythm. No murmur.  ABDOMEN:  Soft, positive bowel sounds. She seems to have some rebound  tenderness, mild, and also some tenderness.  EXTREMITIES:  No edema. Exit site there is no sign of infection.   Her white blood cell count, today, is 21.8, hemoglobin 10.7, hematocrit is  32.1, platelets of 271, and neutrophils 1.  Her sodium is 138; potassium  4.3; BUN 52; creatinine 9.9; calcium is 7.8 and amylase and lipase are 159  and 36.  Pregnancy test is negative.  Her UA specific gravity 1.02, pH of 7,  blood moderate, protein 300, white blood cells are negative and rare  bacteria.  Blood culture is pending.   ASSESSMENT:  1. Patient with nausea, vomiting, diarrhea, low-grade fever, and     leukocytosis possibly we may be dealing  with gastroenteritis, possibly     infectious.  At this moment the patient is on pain medication and on     antibiotics.  At this moment the abdominal tenderness may be associated     with that.  However, since the patient is a peritoneal dialysis (PD)     patient we need to rule out peritonitis.  2. Anemia secondary to her renal insufficiency, and also she has iron-     deficiency anemia.  She is on Epogen.  3. Hypocalcemia related to secondary hyperparathyroidism, the patient on     Calcitrol and TUMS as an outpatient.  Her calcium seems to be better than     the last time she came in.  4. History of diabetes on hypoglycemic agent.  5. Hypertension.  The patient on Prinivil and labetalol.  She did not take     her medication today.   RECOMMENDATIONS:  We will send the PD fluid for cell count and gram stain.  Because of her tenderness, I will treat her with Zinacef if it comes  positive; and if her peritoneal fluid comes negative then probably just put  her on ceftazidime.  We will continue the other medications as before and I  will continue with CAPD using 1.5% to 2.5 liters per exchange.  We will  continue with the other medications as before and will follow patient.      ___________________________________________                                            Alison Murray, M.D.   BB/MEDQ  D:  07/29/2003  T:  07/30/2003  Job:  [id number]

## 2011-02-19 NOTE — Discharge Summary (Signed)
[name], [name]                      ACCOUNT NO.:  [id number]   MEDICAL RECORD NO.:  [id number]                   PATIENT TYPE:  INP   LOCATION:  A228                                 FACILITY:  [facility]   PHYSICIAN:  Micah Flesher. Halm, D.O.                DATE OF BIRTH:  [birth year]   DATE OF ADMISSION:  09/01/2003  DATE OF DISCHARGE:  09/04/2003                                 DISCHARGE SUMMARY   FINAL DIAGNOSES:  1. Hyperkalemia.  2. Hypertension.  3. Chronic renal failure.  4. Type 2 diabetes mellitus.   BRIEF HISTORY:  The patient was admitted to the hospital after a few days'  history of vomiting with abdominal pains.  She was evaluated in the  emergency room and had evidence of hyperkalemia based on a laboratory value  of 6.5 with associated peaked T waves on her EKG.  In the emergency room she  received intravenous calcium, as well as D-10 and 10 units of insulin and  was admitted to manage this persistent hyperkalemic problem.   HOSPITAL COURSE:  [name] was started on Kayexalate orally as well as  continued IV fluids.  Her potassium remained stable throughout the  hospitalization and she had a nice slow decrease.  Her potassium the day  following admission was down to 5.5 and was actually 3.7 at the time of  discharge.  She was also restarted on her peritoneal dialysis immediately.  There was some concern that she may have had poor dialysis procedures at  home, which may have also led to her hyperkalemic state.   With regards to the patient's hypertension.  She did present with a systolic  blood pressure of 210.  She received doses of clonidine, increase in her  Norvasc dose from 5 mg to 10 mg and a continuation of her beta blocker.  Overall, she had an improvement in her blood pressure readings, while in the  hospital, as well.   The patient did have her dialysis fluid analyzed to rule out infection. She  had no microscopic or culture evidence of bacterial  peritonitis.  She was  started on antibiotics empirically, upon admission, and these were  discontinued at the time of discharge.   The patient was discharged in stable condition.  Arrangements were made for  follow up with Dr. Lowanda Foster as planned and Dr. Anitra Lauth on December 29 at 10  a.m.   DISCHARGE MEDICATIONS:  1. Labetalol 300 mg b.i.d.  2. Prandin 2 mg q.a.c.  3. Lisinopril 40 mg b.i.d.  4. Norvasc 10 mg daily.  5. Calcitrol 125 mcg in the morning, 150 mcg in the evening.  6. Cipro 250 mg b.i.d. for the next 3 days.  7. She was also encouraged to continue her peritoneal dialysis on a daily     basis.     ___________________________________________  Micah Flesher. Almyra Free  D:  10/10/2003  T:  10/10/2003  Job:  [id number]   cc:   Tammi Sou, M.D.  Fax: [phone]

## 2011-02-19 NOTE — Discharge Summary (Signed)
NAMEMANNA, [name]                      ACCOUNT NO.:  [id number]   MEDICAL RECORD NO.:  [id number]                   PATIENT TYPE:  INP   LOCATION:  A201                                 FACILITY:  [facility]   PHYSICIAN:  Tammi Sou, M.D.             DATE OF BIRTH:  02/09/1976   DATE OF ADMISSION:  07/29/2003  DATE OF DISCHARGE:  08/01/2003                                 DISCHARGE SUMMARY   ADMISSION DIAGNOSES:  1. Peritonitis.  2. Gastroenteritis.   DISCHARGE DIAGNOSES:  1. Peritonitis.  2. Gastroenteritis.   DISCHARGE MEDICATIONS:  1. Labetalol 300 mg twice daily.  2. Lisinopril 40 mg twice daily.  3. Calcitriol 1.25 mg in the morning and 1.5 mg in the evening.  4. Prandin 2 mg with each meal.  5. Epogen 7000 units subcu Monday and Thursday for 2 weeks.  6. Augmentin 250 mg 3 times per day for 10 days.  7. Ceftazidime 1 gm via peritoneal dialysate which is to be done at the     dialysis center as arranged by Dr. Lowanda Foster beginning on the day of     discharge.   CHRONIC PROBLEM LIST:  1. Hypertension.  2. Diabetes, type 2, non-insulin requiring.  3. Chronic renal failure on peritoneal dialysis.   CONSULTATIONS:  Nephrology:  Dr. Lowanda Foster saw the patient on July 29, 2003 for his initial consult.   PROCEDURE:  1. Evaluation of peritoneal dialysate.  2. CT scan of the abdomen and pelvis which showed moderate pelvic ascites     and mild bowel wall thickening probably due to the ascites.  The     peritoneal dialysis catheter was in place with some soft tissue     thickening around the catheter in the anterior abdominal wall.  No     significant pelvic findings or abdominal findings otherwise.   HISTORY AND PHYSICAL:  For complete H&P please see dictated H&P in chart.  Briefly this is a 35 year old African-American female who is on peritoneal  dialysis for chronic renal failure.  She was admitted for a several hour  history of fever and abdominal pain and  watery diarrhea.  She had  temperature of 100.5 a pulse in the 100 to 110 range and blood pressure in  the 150s/90s on admission.  She had an exam significant for severe abdominal  tenderness in the periumbilical area with some mild rebound tenderness when  palpated in other areas of the abdomen.  She had hypoactive bowel sounds.  Initial white blood cell count was 21,000.  CMET was remarkable only for a  BUN of 57, and creatinine of 11.0.  As stated in procedure section,  abdominal and pelvic CT scan did not reveal any significant pathology.  She  was admitted for further inpatient evaluation and treatment of her pain.   HOSPITAL COURSE BY PROBLEM:  Problem 1:  ABDOMINAL PAIN:  The patient was  admitted and placed  on morphine for pain after initially receiving Dilaudid  in the emergency room.  She had significant improvement of her pain, but not  total resolution.  As state before, exam was consistent with peritonitis and  Dr. Lowanda Foster of nephrology was consulted.  Evaluation of the peritoneal  fluid did show a white blood cell count of 23,400 with 93% neutrophils.  Gram stain showed no organisms.  The patient received 1 gram of Rocephin in  the emergency room and was continued on ceftazidime 1 gm per day and Ancef 1  gm daily per IV.  She had remarkable improvement in her abdominal pain and  had no further fever through the hospitalization.  On the day of discharge  she had complete resolution of her abdominal pain and tenderness.  Culture  of the peritoneal dialysate was negative at 1 day.  Blood cultures were  negative x3 days.  Re-evaluation of the cell count in the peritoneal fluid  after initial day of antibiotics showed a white blood cell count of 400 and  PMNs of 30%.  The patient was found to be clinically remarkably improved and  was deemed appropriate for discharge with continuation of antibiotic  treatment per her dialysate to be arranged by Dr. Lowanda Foster and to be  continued  on the day of discharge at the peritoneal dialysate center.  She  was also to continue on augmentation for 10 days as dictated in the  discharge medication section.   Problem 2:  GASTROENTERITIS:  The patient did present with watery diarrhea  along with her abdominal pain.  She did have continuation of this during the  first hospital day.  Stool studies were sent for evaluation the results were  pending at the time of this dictation.  At discharge the diarrhea had  completely resolved.  I believe that this was likely a viral gastroenteritis  versus possible spastic bowel in reaction to inflamed/infected peritoneal  fluid.   Problem 3:  HYPERTENSION:  The patient  was mildly hypertensive on  admission, but was not eating well and had been loosing some fluid through  watery diarrhea.  Therefore she was put on only half of her regular dose of  antihypertensive medicine.  Throughout the hospitalization her blood  pressure did climb and she was instructed to go back on full home dose of  antihypertensives on the day of discharge.   Problem 4:  PERITONEAL DIALYSIS:  The patient was continued on continuous  peritoneal dialysis in the hospital per Dr. Lowanda Foster.   PERTINENT LABORATORY DATA:  CBC on the day of admission showed a white blood  cell count of 21,800, hemoglobin 10.7 and platelet count of 271,000.  On the  day prior to discharge white blood cell count was 10,900, hemoglobin 9.1,  and platelet count 269,000.  Urinalysis on admission:  All parameters normal  with the exception of 100 of glucose and greater than 300 of protein.  Beta  hCG negative.  Rapid Strep screen negative.  Complete metabolic panel on  July 30, 2003 showed a sodium 137, potassium 5.1, chloride 107, bicarb  19, glucose 119, BUN 57, creatinine 11.0.  Total bilirubin 0.5, alkaline  phosphatase 72, AST 10, ALT 10, total protein 4.9, albumin 2.1, and calcium 7.6.  Stool studies pending at the time of discharge.    DISPOSITION:  The patient was discharged home in good condition on the  previously mentioned discharge medications.  She was instructed to resume  normal activity as tolerated and to  resume her diabetic diet at home.  She  was instructed to return or call her MD if she should have any fever or  persistent abdominal pain.  Outpatient antibiotics were arranged for  peritoneal dialysis center by Dr. Lowanda Foster.  She was instructed to follow up  with me in the clinic in about 2 weeks.     ___________________________________________                                         Tammi Sou, M.D.   PHM/MEDQ  D:  08/01/2003  T:  08/01/2003  Job:  [id number]

## 2011-02-19 NOTE — Consult Note (Signed)
NAMENORMIE, BIRKS                      ACCOUNT NO.:  [id number]   MEDICAL RECORD NO.:  [id number]                   PATIENT TYPE:  EMS   LOCATION:  ED                                   FACILITY:  [facility]   PHYSICIAN:  [name], M.D.                  DATE OF BIRTH:  July 23, 1976   DATE OF CONSULTATION:  DATE OF DISCHARGE:                                   CONSULTATION   CHIEF COMPLAINT:  I feel sick.   HISTORY OF PRESENT ILLNESS:  [name]  is a 35 year old lady  with a history of diabetes type 1, hypertension, end-stage renal disease on  peritoneal dialysis.  Patient has been having general malaise and weakness  associated with nausea and vomiting and generalized weakness for the past  one week.  She admits to having chills but denies any fever.  She came to  the emergency room on account of the above.  In the emergency room the  patient was given IV calcium chloride, insulin and D-50 and Kayexalate on  account of hyperkalemia.   REVIEW OF SYSTEMS:  She denies any shortness of breath or cough.  Denies any  chest pain or palpitations.  Denies any diarrhea.  All other systems  reviewed and were negative.   PAST MEDICAL HISTORY:  1. Diabetes mellitus type 1.  2. She has hypertension.  3. End-stage renal disease on peritoneal dialysis.  4. History of pre-eclampsia.  5. She has had a cesarean section.  6. She has a history of tubal ligation.  7. History of multiple blood transfusions in the past.   CURRENT MEDICATIONS:  1. Labetalol 200 mg b.i.d.  2. Lisinopril 40 daily.  3. Prandin 1 mg.   FAMILY HISTORY:  Significant for diabetes mellitus in her mother and  hypertension in her Dad.   SOCIAL HISTORY:  She currently lives in [place].  She is here visiting her  mother.  She is single.  She has two children.  She does not smoke or drink  alcohol.   PHYSICAL EXAMINATION:  VITAL SIGNS:  Temperature is 98.6, blood pressure is  154/82.  Initially when she  came to the emergency room BP was 191/101.  The  patient had been given IV labetalol by the emergency room physician.  Heart  rate is 62 at this moment.  GENERAL:  She is a young lady.  She looks ill but not in any apparent  distress.  HEENT:  She is not pale.  She has dry lips and tongue.  Pupils equal and  reactive to light and accomodation.  NECK:  Supple.  No jugular venous distention.  No lymphadenopathy.  CHEST:  Air entry is good bilaterally.  No wheezes or crackles.  CVS:  Heart sounds S1 and S2  are normal.  Rhythm is regular.  ABDOMEN:  Soft.  She has a slight tenderness on deep palpation.  No masses  were felt.  Dialysis catheter is present at the infra-umbilical region.  Slight purulent discharge at the entrance to the abdomen.  CNS:  She is alert and oriented x 3.  She has no focal or gross deficits.  EXTREMITIES:  She has no edema.   LABS AND DIAGNOSTICS:  EKG shows a normal sinus rhythm at 68 beats per  minute, normal electrical axis, normal intervals.  She has some tall T waves  in leads V-2, V-3 and V-4.   Her UA is negative for LE and nitrite, positive for protein.  Pregnancy test  is negative.  Her serum potassium on arrival was 9.0.  It is currently 7.8  upon repeat.  Sodium is 137, chloride is 108, CO2 is 24, BUN is 56,  creatinine is 10.2, glucose is 124.  WBCs 9.0, neutrophil count of 91%,  hemoglobin is 11.3, MCV is 91.3, platelet count is 316.   ASSESSMENT/PLAN:  1. Hyperkalemia with electrocardiogram changes.  2. Nausea and vomiting.  3. End-stage renal disease on peritoneal dialysis.  4. Hypertension.  5. Diabetes mellitus.   I discussed the patient's problems with renal consult.  They do not have any  peritoneal dialysis in this hospital at this moment.  I have discussed  possible transfer to [facility] and Dr. Jonnie Finner has agreed to  accept the patient under his care.  The patient will, therefore, be  transferred to [facility] via  [facility].   CONDITION ON DISCHARGE:  Stable and satisfactory.                                                [name], M.D.    DW/MEDQ  D:  04/20/2003  T:  04/21/2003  Job:  [id number]

## 2011-02-19 NOTE — H&P (Signed)
[name], [name]                      ACCOUNT NO.:  [id number]   MEDICAL RECORD NO.:  [id number]                   PATIENT TYPE:  INP   LOCATION:  A201                                 FACILITY:  [facility]   PHYSICIAN:  Tammi Sou, M.D.             DATE OF BIRTH:  1976/07/27   DATE OF ADMISSION:  07/29/2003  DATE OF DISCHARGE:                                HISTORY & PHYSICAL   CHIEF COMPLAINT:  Abdominal pain.   HISTORY OF PRESENT ILLNESS:  The patient is a 35 year old African-American  female who has chronic renal insufficiency and is on peritoneal dialysis.  She presented to [facility] Emergency Department with the complaint  of six to eight hours of crampy abdominal pain and watery bowel movements.  She had six to eight clear watery bowel movements prior to presentation.  No  vomiting, no fever at home.  No odd foods eaten recently, and the patient  denies any sick contacts recently.  The patient reports that her peritoneal  dialysate fluid had been looking clear in the days prior to onset of  symptoms.  She denies the use of any antibiotics recently.   REVIEW OF SYSTEMS:  Mild headache, mild sore throat.  No shortness of  breath.  No significant cough.  No chest pain.  No lower extremity swelling.  No dysuria, no urinary frequency, or urgency.   PAST MEDICAL HISTORY:  1. Diabetes type 2, non-insulin requiring.  2. Hypertension.  3. Chronic renal failure, on peritoneal dialysis.   PAST SURGICAL HISTORY:  1. PD cath placed in April 2003.  2. Cesarean section done in 1996.  3. Bilateral tubal ligation in 2000.   MEDICATIONS:  1. Labetalol 300 mg p.o. b.i.d.  2. Prandin 2 mg with each meal.  3. Lisinopril 40 mg p.o. b.i.d.  4. Caltrol 1.25 mg once daily and 1.5 mg once daily.  5. Epogen 1000 units subcu on Monday, Wednesday, and Friday.   ALLERGIES:  NO KNOWN DRUG ALLERGIES.   SOCIAL HISTORY AND HABITS:  The patient is currently disabled, denies use  of  tobacco, alcohol, or drugs.  She has two children.   FAMILY HISTORY:  Grandfather with diabetes and arthritis.   PHYSICAL EXAMINATION:  VITAL SIGNS:  In the ER, initial temperature was  99.5, and this increased to 100.5 rectally several hours later.  Pulse 106  to 119, blood pressure 150s/87 to 102, respiratory rate 20.  GENERAL:  On my exam, she is tired, but awakens to voice (the patient has  received Dilaudid prior to my exam).  She is oriented to person, place,  time, and situation.  HEENT:  Pupils equal, round, reactive to light and accommodation.  Extraocular movements intact.  No scleral injection or drainage.  Tympanic  membranes show good light reflex and landmarks bilaterally.  Nasal passages  are patent.  Oropharynx with pink moist mucosa without lesion, exudate, or  erythema.  NECK:  Supple with slightly tender anterior cervical lymphadenopathy.  LUNGS:  Clear to auscultation bilaterally.  Breathing non labored, slightly  decreased breath sounds in bases bilaterally in posterior fields.  CARDIOVASCULAR:  Regular rhythm, slightly tachycardic, up to 123456, a 2/6  systolic ejection murmur heard best at the left sternal border.  ABDOMEN:  Soft with significant tenderness to palpation in the periumbilical  area and in the suprapubic area.  Also mild tenderness in the right upper  quadrant.  Palpation of the other parts of the abdomen do not reveal  tenderness with pressing in, but there is a mild amount of rebound  tenderness that is referred to the periumbilical area.  Bowel sounds are  hypoactive.  There is no significant abdominal distention.  EXTREMITIES:  No cyanosis, clubbing, or edema.   LABORATORY DATA:  Urinalysis showed protein of 300, glucose of 100, specific  gravity of 1.02, and was otherwise normal.  Basic metabolic panel showed a  sodium of 138, potassium 4.3, chloride 107, bicarbonate 27, BUN 52,  creatinine 9.9, glucose 117, calcium 7.8.  Urine pregnancy test  negative.  CBC:  White blood cell count 21,800, with 80% neutrophils, 4% lymphocytes.  Hemoglobin 10.7, and platelet count 271,000.   CT scan of the abdomen and pelvis with both oral and IV contrast showed a  moderate amount of peritoneal fluid, mild right-sided colon wall thickening,  consistent with chronic peritoneal fluid.  No other abnormalities noted.   ASSESSMENT AND PLAN:  1. Abdominal pain.  Our main suspicion at this point is infectious     gastroenteritis, but we will need to rule out the possibility of     peritonitis.  Dr. Lowanda Foster of nephrology is assisting, and we will obtain     peritoneal fluid for evaluation.  The patient did receive 1 g of Rocephin     in the emergency department, and we will continue with Ancef and     ceftazidime 1 g intravenously of each daily.  We will send stool studies     for analysis as well.  2. Diabetes type 2.  We will go ahead and cover with sliding scale and     continue diabetic diet in the hospital.  3. Renal/fluids, electrolytes, and nutrition.  Continue with peritoneal     dialysis per Dr. Lowanda Foster.  The patient taking p.o. well for now, and we     will continue with this.  Follow up electrolytes.  4. Hypertension.  We will continue with half of p.o. home medications for     now as blood pressure seems okay, but we will give full home medication     doses when necessary.     ___________________________________________                                         Tammi Sou, M.D.   PHM/MEDQ  D:  07/29/2003  T:  07/29/2003  Job:  [id number]   cc:   Alison Murray, M.D.  [address]  [place]  [place] [address]  Fax: [phone]

## 2011-02-19 NOTE — H&P (Signed)
NAMEALEXANDRA, [name]                      ACCOUNT NO.:  [id number]   MEDICAL RECORD NO.:  [id number]                   PATIENT TYPE:  INP   LOCATION:  6732                                 FACILITY:  [address]   PHYSICIAN:  Sol Blazing, M.D.             DATE OF BIRTH:  [birth year]   DATE OF ADMISSION:  04/20/2003  DATE OF DISCHARGE:                                HISTORY & PHYSICAL   REASON FOR ADMISSION:  Hyperkalemia, nausea, vomiting.   HISTORY OF PRESENT ILLNESS:  The patient is a 35 year old white female with  end-stage renal disease of one year's duration due to diabetes and  hypertension.  She had diabetes onset age 35 and started apparently on  dialysis about a year ago.  She does CAPD with four exchanges a day, 2500 mL  volume.  She lives in [place], [place] and is visiting her family here for  the last couple of days.  She describes about a four- to five-day history of  nausea, vomiting, severe malaise, and fatigue and presented to the [facility] Emergency Room with a potassium of 9 and peak T-waves.  She received  intravenous calcium gluconate, IV insulin, and dextrose and attempt at  Kayexalate which she vomited up.  She was transferred for further admission  to [facility].   She denies any fevers.  She has been having some chills.  Occasional  diarrhea but nothing dramatic.  She has had no abdominal pain, no  hematemesis, no dysphagia or odynophagia.  No history of known  gastroparesis, and she still continues to make urine without dysuria.  She  denies any cloudy peritoneal fluid.   PAST MEDICAL HISTORY:  1. ESRD as above.  2. Diabetes mellitus, on Prandin.  3. Hypertension.  4. C-section.  5. Renal dialysis catheter.   MEDICATIONS:  1. Labetalol 200 she believes is the correct dose b.i.d.  2. ________  b.i.d.  3. EPO 9000 weekly subcutaneous.  4. Calcium carbonate 3-4 with meals.  5. Prandin 1 mg a.c. three times daily.   ALLERGIES:   None.   SOCIAL HISTORY:  She is single.  Has two children, ages 95 and 22, and lives  with them in [place].  Occasional alcohol.  No tobacco.   REVIEW OF SYSTEMS:  CONSTITUTIONAL:  Denies fevers, weight loss.  EYES:  No  visual change.  ENT:  No hearing loss or visual, sore throat, tongue  swelling or swallowing.  CARDIORESPIRATORY:  No chest pain.  No orthopnea,  PND, shortness of breath, hemoptysis.  GASTROINTESTINAL:  As above.  GENITOURINARY:  No change in voiding habits.  No dysuria.  MUSCULOSKELETAL:  No joint pain or swelling.  No myalgias or arthralgias.  NEUROLOGIC:  No  history of focal numbness or weakness.  No history of stroke, TIA, or  seizure.  PSYCHIATRIC:  No history of anxiety, depression, or  hallucinations.   PHYSICAL EXAMINATION:  VITAL SIGNS:  Blood pressure 180/80 lying, 180/86  standing, pulse 78, respiratory rate 16, temperature 97.6.  GENERAL:  Alert and young black female who is very weak.  Otherwise in no  acute distress.  She is fully oriented.  SKIN:  Without rash.  HEENT:  PERRL.  EOMI.  There is some periorbital edema.  Throat was clear  and moist.  NECK:  Supple.  There is JVP of about 12 cm.  CHEST:  Clear both to auscultation and percussion bilaterally.  CARDIAC:  Regular rhythm.  Without murmur, rub, or gallop.  ABDOMEN:  Soft, nontender.  Active bowel sounds.  There is a left lower  quadrant renal dialysis catheter with a clean catheter site.  EXTREMITIES:  No joint effusions.  There is trace edema in the ankles.  NEUROLOGIC:  Nonfocal motor exam.  No focal cranial nerve deficits.  No  asterixis.   LABORATORIES:  Sodium 139, potassium 7.7, BUN 59, creatinine 9.9, CO2 20,  chloride 112.  White blood cell count 9000.  Hemoglobin 11.2, platelets  normal.  Calcium 8.1.  Urinalysis negative nitrites and leukocyte esterase,  3-6 white blood cells, rare bacteria.  Urine pregnancy test was negative.   IMPRESSION:  1. Severe hyperkalemia with EKG changes  status post acute treatment at [facility] with some improvement but not sufficient.  There must be a     dietary source which the patient cannot identify.  She has been drinking     orange juice and citrus drinks.  2. Fatigue, nausea, vomiting which I believe is probably due to     hyperkalemia.  3. End-stage renal disease, on chronic ambulatory peritoneal dialysis.  4. Hypertension, on beta blocker and ACE inhibitor.  5. Volume status:  She is slightly volume expanded.  6. Anemia, on subcutaneous ________ and EPO.  7. Diabetes mellitus, on Prandin.  She is not on any insulin therapy.   PLAN:  I have placed a hemodialysis catheter for acute hemodialysis, _______  and  hyperkalemia.                                               Sol Blazing, M.D.    RDS/MEDQ  D:  04/20/2003  T:  04/22/2003  Job:  [id number]

## 2011-02-19 NOTE — Procedures (Signed)
NAMEJODI-ANN, GENUNG                      ACCOUNT NO.:  [id number]   MEDICAL RECORD NO.:  [id number]                   PATIENT TYPE:  EMS   LOCATION:  ED                                   FACILITY:  [facility]   PHYSICIAN:  Edward L. Luan Pulling, M.D.             DATE OF BIRTH:  Apr 18, 1976   DATE OF PROCEDURE:  01/26/2004  DATE OF DISCHARGE:  01/26/2004                                EKG INTERPRETATION   The computer is read probable early repolarization, I do not see much  evidence of that.  I do believe there is left ventricular hypertrophy and  there is considerable baseline wavering in the limb leads.      ___________________________________________                                            Jasper Loser Luan Pulling, M.D.   ELH/MEDQ  D:  01/26/2004  T:  01/26/2004  Job:  [id number]

## 2011-02-19 NOTE — Discharge Summary (Signed)
[facility]  Patient:    [name], [name] Visit Number: [id number] MRN: [id number]          Service Type: MED Location: 2A A207 01 Attending Physician:  Rexene Alberts Dictated by:   Rexene Alberts, M.D. Admit Date:  07/27/2001 Discharge Date: 07/30/2001                             Discharge Summary  DISCHARGE DIAGNOSES:  1. Hypertensive urgency.  2. Type 1 diabetes mellitus with significant hyperglycemia on admission.  3. 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     a)  Creatinine clearance on July 28, 2001 was 13.     b)  24 hour urine protein excretion was 6.47 gm in 24 hours.  4. 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  5. Chronic lower extremity edema.  6. Chronic systolic murmur.  SECONDARY DISCHARGE DIAGNOSES:  1. History of gestational diabetes.  2. History of preeclampsia.  3. Status post cesarean section in 1996.  4. Status post bilateral tubal ligation in 2000.  5. History of multiple blood transfusions secondary to chronic anemia.  DISCHARGE MEDICATIONS:  1. Insulin 70/30 15 units in the morning and 15 units in the evening subcu.  2. Prinivil 10 mg q.d.  3. Labetalol 100 mg q.d.  4. Hemocyte 1 tab q.d.  5. Reglan 10 mg one table prior to breakfast p.r.n.  6. Bicarbonate one tablet q.d.  7. Lasix 20 mg one each day.   DISPOSITION:  The patient was discharged to home in stable and good condition on July 30, 2001. The patient has a follow-up appointment with her primary care physician, Dr. Caryn Section, on Wednesday, October 30 between 2 and 3 p.m. The patient was admonished to check her blood sugars daily and to bring a record of her blood sugars to the hospital follow-up appointment. She is scheduled to return to work on August 03, 2001.  PROCEDURES PERFORMED:  1. 24 hour urine for collection of protein and calculation of creatinine     clearance. The results  revealed a creatinine clearance of 13. It revealed     a 24 hour urine protein of 6468 mg.  2. Renal ultrasound. The results revealed normal size kidneys bilaterally,     no evidence for hydronephrosis. 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  HISTORY OF PRESENT ILLNESS:  [name] is a 35 year old African-American woman who presented to the office for the first time on July 27, 2001 with a chief complaint of severe headache x 2 days. The headache actually woke her from her sleep the morning of the admission. The patient had intermittent blurred vision for the past 2-3 weeks worse over the past 2-3 days with her headache. She did have some associated photophobia. She denied any numbness in her extremities, blindness, focal weakness or blackouts.  The patients blood pressure in the office on the day of admission was 240/140 taken manually. In addition, her blood sugar was read at 560. The patient who recently moved to [place], [place] admitted being noncompliant with her insulin and her blood pressure medicines for approximately one month. She had recently been treated by her primary care physician and a nephrologist in [place], [place]. The patient denies any history of diabetic ketoacidosis.  For additional details, please see the written history and physical.  HOSPITAL COURSE:  #1 - HEADACHE  PROBABLY SECONDARY TO HYPERTENSIVE URGENCY. The initial management included admitting the patient to the ICU, starting the patient on Prinivil at 10 mg q.d. and labetalol 200 mg x 1 dose and then labetalol 100 mg t.i.d.  Baseline lab work was also obtained to assess the patients electrolytes and thyroid function. The patient was also treated with IV fluid volume repletion secondary to her hyperglycemia. She was treated for headache with Tylenol and Darvocet as needed. On initial exam, the patient revealed no neurological focal deficiencies or findings therefore  a CT scan of the head was not done. Over the initial 24 hours of hospitalization with the treatment of Prinivil and labetalol, the patients blood pressures improved to systolically [id number] to [id number] to 85. The goal certainly was not a rapid reduction in her blood pressure; however, she responded very quickly to the Prinivil and labetalol. On hospital day #2, the labetalol dosage was reduced to 100 mg q.d. and the Prinivil was maintained at 10 mg q.d.  The patients EKG was assessed and found to be within normal limits with normal sinus rhythm with a heart rate of 79. The patients lungs and heart were assessed with the chest x-ray which revealed that the patient had a normal size heart; however, she did have some lingular scarring versus atelectasis. Otherwise her lungs were clear. The patient will be sent home on labetalol 100 mg q.d. and Prinivil 10 mg q.d. She will have her blood pressure reassessed in approximately 3-5 days.  #2 - TYPE I INSULIN DEPENDENT DIABETES MELLITUS.  The initial management included aggressive IV fluid rehydration/volume repletion, the patient was given 15 units of 70/30 insulin in the office x 1, and she was thereafter placed on an insulin drip at 2 units per hour, her blood sugars were monitored q. 3h x 24 hours and then q. 4h, and the patient was initially treated with a full liquid diet. Within 12 hours of hospital admission, the patients blood sugars improved to the range of 290 to 350. When her blood sugars reached 250 or below, the IV fluids were changed to D5 normal saline. The patient was subsequently covered with a sliding scale insulin regimen. On hospital day #2, her blood sugars ranged from 170 to 250s. Her diet was advanced to an 1800 calorie ADA 4 gm sodium diet. When it was felt that the patient was volume repleted, the IV fluids were tapered off at the end of hospital day #2. On hospital day #3, the patients usual home dose of 70/30 insulin was  restarted. It was initially started at 15 units in the morning and 15 units in the  evening; however, it was temporarily increased to 20 units in the morning and 15 units in the evening x 1 day. When she experienced a low blood sugar of 69 during the hospital course, the morning dose was reduced again to 15 units.  Given that the patient has a history of diabetic nephropathy per her report, a creatinine clearance and a 24 hour urine was collected for protein excretion. The creatinine clearance revealed a level of 13 and the 24 hour protein revealed that she excreted over 6 gm in her urine in a 24 hour period. The patient will be maintained on the ACE-inhibitor Prinivil at the current dose of 10 mg q.d. She will be referred to a nephrologist of her choice either in [place] or [employer]. The appointment will be made when she returns for hospital follow-up in 3-5 days. During the  hospital course, the patient also received instruction on a diabetic low salt diet from the dietician. She also was given a one touch glucometer to take home with prescriptions for lancet strips and syringes. The patient stated that she would try to adhere to a diabetic diet as well as take her insulin on a daily basis.  Given that the patient has a history of metabolic acidosis and chronic lower extremity edema which is probably secondary to her nephropathy, she was maintained on her home dose of bicarbonate 1 tablet q.d. and Lasix 20 mg q.d. In addition to assessing the patients renal function, an ultrasound of her kidneys were assessed. The ultrasound of the kidneys was essentially unremarkable. The patients hemoglobin A1C was 14.9 which revealed very poor glycemic control.  #3 - CHRONIC NORMOCYTIC ANEMIA.  The patient gives a history of severe anemia at one time which required blood transfusion by her nephrologist. The anemia was felt to be secondary to her chronic renal insufficiency. During  the hospital course, her initial hemoglobin was 11.1 and initial hematocrit was 31.8. However with the IV fluids, the patients hemoglobin fell to 9.0 and hematocrit fell to 26.1. Iron studies were obtained for evaluation and revealed a ferritin of 146, total iron of 49 with 42 to 135 being normal range, total iron binding capacity at 166 with 250 to 470 being normal range and percent saturation of 30%. The patient was treated empirically with hemocyte one pill each day. As above, she will be referred to a local nephrologist of her choice for further evaluation. The patient will probably need Epogen and possibly intravenous iron therapy in the near future.  #4 - CHRONIC LOWER EXTREMITY EDEMA.  The patient gives a 1-2 year history of chronic lower extremity edema. She states that she was told in the past that the swelling in her legs was probably secondary to her reduced kidney function. The patients lower extremity edema appeared to be consistent with nephropathy. As stated above, the patient was treated with Lasix 20 mg each day. She was also given TED hose for daily use. The lower extremity edema resolved during the hospital course.  The patients thyroid function test was assessed and revealed a TSH of 1.4, a free T4 of 0.93 both within normal limits. Dictated by:   Rexene Alberts, M.D.  Attending Physician:  Rexene Alberts DD:  07/30/01 TD:  08/01/01 Job: [id number]

## 2011-02-19 NOTE — H&P (Signed)
[name], [name]                      ACCOUNT NO.:  [id number]   MEDICAL RECORD NO.:  [id number]                   PATIENT TYPE:  INP   LOCATION:  A227                                 FACILITY:  [facility]   PHYSICIAN:  Margaretmary Eddy, M.D.             DATE OF BIRTH:  1976/02/25   DATE OF ADMISSION:  06/25/2003  DATE OF DISCHARGE:                                HISTORY & PHYSICAL   CHIEF COMPLAINT:  Abdominal pain.   SUBJECTIVE:  This patient is a 35 year old black female with a history of  chronic peritoneal dialysis, type 2 diabetes and hypertension who presents  to the emergency room for the second time in the past 12 hours with  complaints of significant abdominal pain.  The patient notes that her pain  is worse in the epigastrium.  The patient was in her usual state of fair  health until about a day and a half ago when she developed a full sensation  in her abdomen.  Yesterday, she developed nausea off and on throughout the  day, along with mild epigastric discomfort.  During the night, the patient  vomited some and she has also vomited several times today.  She has  developed progressive pain in her epigastrium.  She was seen in the  emergency room earlier today and diagnosed with urinary tract infection.  She was given medications appropriate for this and has not improved.  If  anything, the patient is worse and the pain does not radiate significantly.  The pain tends to come and go.  The patient has noted no fever or chills.  No change in bowel habits.  No change in urination.   CURRENT MEDICATIONS:  Lisinopril, Prandin, Labetalol, Calcitrol, exact doses  patient uncertain.   PRIOR SURGERIES:  1. C-section x 2.  2. Bilateral tubal ligation.  3. Placement of catheter for dialysis.   SOCIAL HISTORY:  The patient lives with parents.  Has two children of her  own.  She has worked up until recently.  No alcohol abuse.  No cigarette  abuse.   FAMILY HISTORY:   Positive for hypertension and diabetes.  No history of  pancreatitis.   PRIOR MEDICAL HISTORY:  Significant for dialysis times a year and a half.  Dr. Lowanda Foster is her nephrologist.   REVIEW OF SYSTEMS:  Otherwise negative.   PHYSICAL EXAMINATION:  VITAL SIGNS:  Blood pressure 168/133 initially.  GENERAL:  The patient is alert, in some distress, holding her abdomen  intermittently.  HEENT:  Normal.  NECK:  Supple.  Mucous membranes slightly dry.  LUNGS:  Clear.  HEART:  Regular rate and rhythm.  ABDOMEN:  Epigastrium distinct tenderness in the epigastric area.  Slight  guarding.  No rebound.  Bowel sounds diminished.  PELVIC:  Deferred.  RECTAL:  Deferred.  EXTREMITIES:  Normal.  No significant edema.  SKIN:  Normal.   SIGNIFICANT LABS:  White blood count 16.9, hemoglobin  15.6.  Liver enzymes  normal.  Lipase up at 112.  MET-7 BUN 72, creatinine 10.  Bicarb 22.  Sodium  133.   Blood pressure after infusion of labetalol, diastolic is down to 123XX123.  Review of x-rays, from earlier in the day, show a couple of small dilated  bowel loops with a couple of air/fluid levels.   IMPRESSION:  1. Epigastric abdominal pain with elevated pancreas enzymes and elevated     white blood count, along with epigastric tenderness.  Certainly on the     list is pancreatitis, gallbladder disease and other less likely     etiologies such as ulcer disease causing secondary irritation of the     pancreas.  There is no free air noted on x-ray.  We can delay till     tomorrow morning ultrasound, but I advise x-ray.  She must get her     ultrasound early in the morning.  At this time I will not call in a     surgeon.  I will be handing her management over to Dr. Anitra Lauth, her usual     physician in the morning and allow him to make appropriate referral if     the ultrasound reveals active gallbladder disease.  We will also cover     with antibiotics and appropriate pain control.  2. Hypertension, poorly  controlled.  Patient has been unable to keep her     oral medications down.  We will have to go with parenteral therapy.  See     orders.  3. Type 2 diabetes.  Will cover with sliding scale for now.   PLAN:  As per orders and as above.     ___________________________________________                                         Margaretmary Eddy, M.D.   WSL/MEDQ  D:  06/25/2003  T:  06/25/2003  Job:  [id number]

## 2011-02-19 NOTE — H&P (Signed)
NAME:  [name], [name]                      ACCOUNT NO.:  [id number]   MEDICAL RECORD NO.:  [id number]                   PATIENT TYPE:   LOCATION:                                       FACILITY:   PHYSICIAN:  R. Garfield Cornea, M.D.              DATE OF BIRTH:  [birth year]   DATE OF ADMISSION:  DATE OF DISCHARGE:                                HISTORY & PHYSICAL   CHIEF COMPLAINT:  Nausea and vomiting.   HISTORY OF PRESENT ILLNESS:  [name] was seen in consultation in our  office on July 17, 2003 in the wake of a recent hospitalization for upper  abdominal pain and leukocytosis, nausea, and vomiting. She had a mildly  elevated lipase, and there was concern of pancreatitis; however, subsequent  laboratory values normalized. The pancreas on a normal appearance on  ultrasound back on April 21, 2003 (prior study). There was a question as to  whether or not she actually had pancreatitis. When she was seen back in the  office on July 17, 2003, abdominal pain was resolved. She was having  intermittent nausea and vomiting which has persisted but again no abdominal  pain. No significant reflux symptoms. No odynophagia or dysphagia. Some  early satiety. No weight loss. No melena or rectal bleeding. She has not had  her upper GI imaged. She is not on any nonsteroidal agents. She is taking  Protonix 40 mg orally daily as well as Zofran p.r.n.   PAST MEDICAL HISTORY:  1. Significant for renal failure on peritoneal dialysis.  2. Type 2 diabetes mellitus.  3. Hypertension.  4. History of gastroesophageal reflux disease, put on Protonix.  5. On kidney transplant list at [facility].  6. Status post Cesarean section x1.   CURRENT MEDICATIONS:  1. Lisinopril 40 mg b.i.d.  2. Labetalol 300 mg b.i.d.  3. Prandin 2 mg t.i.d.  4. Calcitriol daily.  5. Protonix 40 mg daily.  6. Nephro-Vite one daily.  7. Epogen 10,000 x3 weekly.  8. Norvasc 10 mg a day.  9. Zofran 4 mg p.r.n.   ALLERGIES:  No known drug allergies.   FAMILY HISTORY:  No history of chronic gastrointestinal or liver illness.   SOCIAL HISTORY:  The patient lives with her parents. She has two children,  one son who is 4 and one daughter who is 8. She is disabled.   REVIEW OF SYSTEMS:  As in history of present illness.   PHYSICAL EXAMINATION:  GENERAL:  A pleasant 35 year old lady resting  comfortably.  VITAL SIGNS:  Weight 192, blood pressure 162/80, pulse 80.  SKIN:  Warm and dry.  HEENT:  No sclerae icterus. Conjunctivae are pink. JVD is not a problem.  CHEST:  Lungs are clear to auscultation.  CARDIAC:  Regular rate and rhythm without murmurs, rubs, or gallops.  ABDOMEN:  Obese. She has a tattoo of a dolphin on her abdomen. She has a  peritoneal dialysis catheter,  left lower quadrant. Abdomen has good bowel  sounds. No succussion splash, soft, nontender, without appreciable masses or  organomegaly.  EXTREMITIES:  Trace lower extremity edema.   IMPRESSION:  [name] is a 35 year old lady with a recent bout  of upper abdominal pain in the setting of isolated elevated lipase, normal  hepatobiliary tree and pancreas on prior ultrasound. She continues to have  intermittent nausea and vomiting. Has history of GERD, but GERD symptoms are  well controlled on Protonix 40 mg orally daily. We really need to directly  visualize her upper GI tract to make sure that is not an inflammatory  process contributing her symptoms. I suspect underlying gastroparesis.   RECOMMENDATIONS:  EGD in the near future at [facility]. Discussed  procedure with [name], and potential, risks, benefits, and  alternatives have been reviewed and questions answered. She is agreeable. We  will make further recommendations once endoscopic evaluation has been  performed.     ___________________________________________                                         Bridgette Habermann, M.D.   RMR/MEDQ  D:   08/26/2003  T:  08/26/2003  Job:  [id number]   cc:   Tammi Sou, M.D.  [facility]  [place], [place] [address]  Fax: [phone]   R. Garfield Cornea, M.D.  [address]  [place]  [place] [address]  Fax: [phone]

## 2011-02-19 NOTE — Discharge Summary (Signed)
[name], [name]            ACCOUNT NO.:  [id number]   MEDICAL RECORD NO.:  [id number]          PATIENT TYPE:  INP   LOCATION:  6740                         FACILITY:  [facility]   PHYSICIAN:  Sol Blazing, M.D.DATE OF BIRTH:  December 02, 1975   DATE OF ADMISSION:  01/20/2008  DATE OF DISCHARGE:  01/23/2008                               DISCHARGE SUMMARY   DISCHARGE DIAGNOSES:  1. Culture-negative peritonitis.  2. End-stage renal disease.  3. Type 2 diabetes mellitus.  4. Hypertension.  5. Anemia.  6. Malnutrition with hypoalbuminemia.   PROCEDURE:  Chronic ambulatory peritoneal dialysis.   HISTORY OF PRESENT ILLNESS:  [name] is a 35 year old African  American female on CAPD for 6 years.  The patient does four exchanges  per day and has had only 1-2 episodes of peritonitis over the past 6  years.  Over the last couple of weeks, she has been having catheter  problems with the leaky catheter and then some difficulties with the  catheter hardware slipping off the tube.  The patient was put on some  oral Keflex for possible breach in sterility and today she developed  abdominal pain and subjective fever throughout the day.  The peritoneal  fluid turned cloudy this afternoon.  She presented to the emergency room  with these complaints.  In the emergency room, she was found to have the  systemic blood sugar of 1000 and cloudy PD fluid.  She was in no acute  distress.  She has complained of intermittent abdominal pain and she  received 6 mg IV morphine in the emergency room.   The laboratory data in the emergency room showed sodium 118, BUN 28,  creatinine 10.4, chloride 85.  These are all stat values.  Potassium  4.5, blood sugar greater than 700 with confirmation levels closer to  1000.  White count 12,000, hemoglobin 8.4, platelets normal.  Abdominal  x-ray showed nonspecific bowel gas pattern.  Chest x-ray, bibasilar  atelectasis.   HOSPITAL COURSE:  1. Culture-negative  peritonitis.  The patient received fluid and blood      cultures on admission and then empirically treated with vancomycin      and Fortaz.  She received rapid exchanges x2 in the emergency room      prior to the initiation of antibiotics.  She was admitted to the      intensive care unit due to her high blood sugar.  Serial cell      counts were done on her PD fluid on day 2.  White blood cells had      decreased to 30 from 82 and neutrophils decreased from 81% to 64%      on days three.  White cells were done to 10 with 25% neutrophils,      which is in the higher range of normal, and by that time, her fluid      was clear.  Her blood and fluid cultures eventually showed no      growth.  She completed a course of vancomycin and Fortaz at her      outpatient home training center where  it was given      intraperitoneally.  2. End-stage renal disease.  As previously mentioned, the patient      received CAPD during her hospitalization.  She was prone to      hypokalemia and received supplemental KCl throughout her      hospitalization.  Last chemistries on January 23, 2008, showed a      sodium 130, potassium 2.9, chloride 92, CO2 28, glucose 256, BUN      21, creatinine 9.9, albumin 1.5, calcium 7.6, phosphorus 3.6.  She      will continue on her same dialysis prescription that she was on      prior to admission when she returns home.  The patient continues on      potassium chloride 20 mEq three times a day after discharge.  3. Type 2 diabetes mellitus.  The patient was quite hyperglycemic on      admission.  Blood was negative for ketones.  She was placed on the      Glucommander in the intensive care unit and eventually transitioned      to Lantus.  She normally takes Lantus 10 units in the evening and 5      units of Humalog with each meal.  Her discharge Lantus is now 20      units in the evening and she is to continue Humalog sliding scale      coverage with meals.  Lantus may need to  be titrated up further in      her home setting.  4. Hypotension.  The patient was hypotensive on admission due to her      acute illness.  Her lisinopril was placed on hold and held      throughout her hospitalization.  Last blood pressures prior to      discharge ranged between 90 and [id number] systolic.  Her weight on January 23, 2008, was 87.5.  She is to resume her lisinopril at home when      blood pressure returns to its usual range.  5. Anemia.  The patient's hemoglobin actually declined during her      hospitalization.  She received 200 mcg of Aranesp.  Total iron was      58.  She did not received supplemental iron.  This information was      conveyed to her Dialysis Center and they need to make sure she is      taking her Epogen at home.  There was some thought that the patient      may not be compliant with this.  Hemoglobin at discharge was 7.8,      which possibly contributes to her hypotension, but it was not felt      that she needed transfusion at this time.  6. Malnutrition.  Albumin on her monthly labs was 3.3.  It decreased      to as low as 1.4 during her hospitalization.  She apparently had      been on Megace previously and was told by Dr. Moshe Cipro to      continue it after discharge that was not written on her discharge      medication list nor was it on her admission medication list.      However, her albumin will be rechecked with her monthly labs at her      Dialysis Center and hopefully will resolve with the resolution of  this acute illness.  She will be encouraged to increase protein in      her diet.   CONDITION AT DISCHARGE:  Improved.   DISCHARGE DISPOSITION:  The patient is to return home.   DISCHARGE MEDICATIONS:  1. KCl 20 mEq three times a day.  2. Lantus 20 units daily at bedtime.  3. Epogen.  She is to take the max dose per home training which is      24,000 units and this will be reviewed with the patient at home      training.  4.  Protonix 40 mg per day.  5. Hold lisinopril for now and re-institute when blood pressure      increases.  6. The patient is to start Nephro-Vite one daily.  She had not been on      vitamins before.  7. The patient is to continue sliding scale Humalog with meals.  Home      training is to give her vancomycin and Tressie Ellis as per their protocol      on January 24, 2008, for a total of 2 weeks total from January 20, 2008, and they are to follow up PD fluid and blood cultures which      showed no growth at the time of discharge and final cultures were      also negative.   DISCHARGE DIET:  Low-sodium, high-protein, low-phosphorus.   ACTIVITY LEVEL:  Increase activity slowly.   35 minutes were spent completing the patient's discharge paperwork,  notifying the patient's Dialysis Center, and dictating discharge  summary.      Alric Seton, P.A.      Sol Blazing, M.D.  Electronically Signed    MB/MEDQ  D:  02/15/2008  T:  02/16/2008  Job:  [id number]

## 2011-02-19 NOTE — Consult Note (Signed)
[name], [name]                      ACCOUNT NO.:  [id number]   MEDICAL RECORD NO.:  [id number]                   PATIENT TYPE:   LOCATION:                                       FACILITY:   PHYSICIAN:  Alison Murray, M.D.             DATE OF BIRTH:  Jan 05, 1976   DATE OF CONSULTATION:  DATE OF DISCHARGE:                                   CONSULTATION   REASON FOR CONSULTATION:  End-stage COPD.   [name] is a 35 year old African-American female with past medical  history of diabetes hypertension, end-stage renal disease on peritoneal  dialysis. Presently came with complaints of nausea, vomiting, and  generalized weakness.  Initially, the day before yesterday the patient came  to the dialysis with some nausea, vomiting, no diarrhea, no fever or chills  and during that time the possibility of gastroenteritis was entertained and  the patient was put on some Phenergan and suggested to have some blood work  in the emergency room, but the patient refused in the evening. Hence she did  not come. Yesterday she appeared in the emergency room.  She was found to  have a UTI and she was started on antibiotics and sent on home and to come  back in the afternoon with continuous problem of weakness, some abdominal  pain, some nausea and vomiting.   Presently, the patient says that she is feeling better, but still she has  some nausea.  Also she had an episode of diarrhea yesterday.  At this moment  she does not have any other problems.  The patient also says that she has  some epigastric discomfort, but the PD fluid is clear.   PAST MEDICAL HISTORY:  1. She has a history of diabetes, longstanding.  2. History of hypertension.  3. End-stage renal disease thought to be secondary to diabetes.  4. History of hypertension.  5. History of preeclampsia.  6. History of hyperkalemia.  7. History of anemia.  8. History of tubal ligation.   ALLERGIES:  No known allergies.   MEDICATIONS:  1. Rocephin 1 g IV q.24h.  2. Vasotec 1.5 mg IV q.2-6h.  3. Novolin 30 units a.c. and h.s.  4. Protonix 40 mg IV q.24h.  5. She is also getting morphine on a p.r.n. basis.  6. Phenergan on a p.r.n. basis.  7. Flagyl 500 mg IV.  8. She is getting IV fluid at 45 cc/h.   SOCIAL HISTORY:  No history of smoking.  No history of alcohol abuse.  The  patient recently moved here from [place].   FAMILY HISTORY:  Her mother has history of diabetes and her father has  history of hypertension.   REVIEW OF SYSTEMS:  No fever or chills. She states she has some sweating.  She has mainly nausea, vomiting, and no chest pain, no shortness of breath,  no dizziness.  She also has some abdominal pain which started yesterday.  She  had 1-2 episodes of diarrhea yesterday. She makes some urine.  She  denies any urgency or frequency.   PHYSICAL EXAMINATION:  GENERAL: On examination the patient, at this moment,  seems to be somewhat somnolent from the medication that she received, but  arousable. No complaints.  VITAL SIGNS: Her temperature is 98.  Blood pressure is 156/91.  Pulse of  110, respiratory rate is 20.  HEENT: No conjunctival pallor. No icterus.  Oral mucosa seems to be somewhat  dry.  NECK: Supple, no JVD.  CHEST: Clear to auscultation. No rales, no rhonchi.  HEART: His heart exam revealed regular rate and rhythm. No S2.  ABDOMEN: Soft. She has slight tenderness on the let side.  No rebound  tenderness. Exit site seemed to be clean.  EXTREMITIES: No edema.   LABORATORY DATA:  Her white blood cell count 16.6, hemoglobin 13.4,  hematocrit 40.7 with platelets of 152.  Her sodium, today, 137, potassium  5.7, BUN 75, creatinine 10.4. Total protein 4.8, albumin 2.3 and calcium of  7.2.  Amylase 49, lipase is 31. Yesterday her lipase was 112.  UA: Cloudy,  specific gravity 1.020, pH of 6. She has large blood and also she has 11-20  leukocytes, and has some bacteria.   ASSESSMENT:   1. Nausea and vomiting.  Etiology at this moment not clear, but probably     multifactorial including gastroenteritis. This could be secondary to     gastroenteritis.  Since her lipase is slightly high, I am not sure     whether she also has pancreatitis.  Peritoneal dialysis fluid seems to be     clear.  2. Urinary tract infection.  The patient with leukocytosis and also she has     some bacteria in the urine with leukocyte count elevated.  She has been     started on antibiotics.  She is afebrile at this moment.  3. Hyperkalemia. The patient states that she had some nausea and vomiting     yesterday.  She tried to have some calories, because of that she was     drinking lots of orange juice, probably that might have elevated her     potassium at 5.7.  4. 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     Her BUN is 75 and creatinine 10.4.  Potassium, as stated above is 5.7.  5. History of hypertension.  She is on Vasotec.  Blood pressure seems to be     reasonable.  6. Diabetes.  Her blood sugar which was random from this morning is 135.   RECOMMENDATIONS:  Will continue with PD.  Will use 1.5% Dianeal solution.  Will send cell count from her PD fluid and possibly we will put her on a low  potassium diet and if her potassium continues to increase we will use  Kayexalate.  We will try to give her some Lasix as that patient seems to  have some urine output.  We will continue the other medications as described  before.      ___________________________________________                                            Alison Murray, M.D.   BB/MEDQ  D:  06/26/2003  T:  06/26/2003  Job:  [id number]

## 2011-02-19 NOTE — Consult Note (Signed)
[name], [name]                      ACCOUNT NO.:  [id number]   MEDICAL RECORD NO.:  [id number]                   PATIENT TYPE:  INP   LOCATION:  [id number]                                 FACILITY:  [facility]   PHYSICIAN:  Alison Murray, M.D.             DATE OF BIRTH:  1976-04-26   DATE OF CONSULTATION:  09/01/2003  DATE OF DISCHARGE:                                   CONSULTATION   REASON FOR CONSULTATION:  End-stage for peritoneal dialysis.   HISTORY OF PRESENT ILLNESS:  [name] is a 35 year old African-American  female with a past medical history of hypertension, type 2 diabetes, end-  stage renal disease, on CAPD.  Presently, came through the emergency room  with the complaints of weakness, some nausea and vomiting.  According to the  patient, about four to six days ago she  started having some cough and  congestion that was followed by periodic episodes of nausea and vomiting and  also a few episodes of diarrhea which have improved.  That followed with  abdominal pain and some nausea.  Hence, the patient decided to come to the  emergency room.  She was found to have hyperkalemia and also hypertensive.  Hence, the patient is admitted to the hospital.  According to her and  because she was not able to eat, says she cannot take her medications.  She  is not taking her medications with orange juice as that might have possibly  contributed to her depleted potassium.  Presently, she denies any diarrhea  and also no vomiting, but mainly upper abdominal pain.  [name] was  urgently admitted with complaints of gastroenteritis and during that time  the patient was found to have peritonitis and treated with antibiotics.   PAST MEDICAL HISTORY:  1. Recent history of peritonitis.  2. History of hypertension.  3. History of preeclampsia.  4. History of hyperkalemia.  5. History of anemia.  6. History of type 2 diabetes.  7. History of end-stage renal disease, on  maintained peritoneal dialysis.  8. History of tubal ligation.   ALLERGIES:  No known allergies.   MEDICATIONS:  1. Calcitriol 0.05 mcg p.o. daily.  2. Tressie Ellis 1 g IV q.24h.  3. Catapres 0.1 mg p.o. daily.  4. She is getting IV fluids at 50 cc per hour.  5. Lactulose 100 mg p.o. b.i.d.  6. Lisinopril 40 mg p.o. daily.  7. Kayexalate 50 mg p.o. q.i.d.  8. Tylenol one p.r.n.  9. ___________ p.r.n.  10.      Calcium gluconate 1000 mg IV was given for hyperkalemia.  11.      Gentamycin 90 mg IV one dose.  12.      Morphine sulfate 4 mcg p.o. daily.  13.      Phenergan 25 mg IV and one p.o. p.r.n.   SOCIAL HISTORY:  No history of smoking, no history of alcohol abuse.   REVIEW OF SYSTEMS:  At this moment the patient denies any fevers, chills, or  sweating.  Mainly nausea and also some abdominal pain, epigastric without  radiation to the back.  No chest pain, no shortness of breath, no orthopnea.  She also denies any urgency or frequency.  She also denies any diarrhea.   PHYSICAL EXAMINATION:  GENERAL:  The patient is alert, answering questions,  in no apparent distress.  VITAL SIGNS:  Her blood pressure is 170/110, temperature is 98.1.  HEENT:  No conjunctival pallor, nonicteric.  Oral mucosa seems to be  somewhat dry.  NECK:  Supple, no evidence of JVD.  CHEST:  Clear to auscultation.  HEART:  Regular rate and rhythm.  No murmur.  ABDOMEN:  Soft nontender.  No rebound tenderness.  Positive bowel sounds.  EXTREMITIES:  No edema.   LABORATORY DATA:  Her blood work:  Her white blood cell count is 13.8,  hemoglobin 15.8, hematocrit 49.2, platelets _________, leukocytes 9, and  neutrophils 11.  Sodium 156, potassium 6.5, CO2 16, BUN 68, creatinine 11.4,  chloride 109, total protein 5.1, albumin 2.4, calcium 7.9.  Blood culture  was not done.   ASSESSMENT:  1. Hyperkalemia.  Possibly because she did not have a peritoneal dialysis     exchange since last night and also because of  foods containing high     potassium, including orange juice.  At this moment, the EKG according to     what is documented she has QT elevation, no previous EKG is available to     compare, but at this moment serum potassium is not significantly high.     She is treated with calcium gluconate and also Kayexalate given.  2. End-stage.  She is on CAPD.  As stated above, her last exchange was more     than 24 hours ago.  Hence, not sure whether she evens knows the exact     time.  Her BUN and creatinine still is in acceptable range, but she has     low CO2, possibly metabolic acidosis and also hyperkalemia.  However,     there is no sign of fluid overload.  3. Nausea and history of vomiting.  At this moment, no diarrhea,     gastroenteritis viral versus bacterial since it has been a holiday, but     the patient's ___________ problem.  4. Hypertension, uncontrolled.  Possibly because she was not able to pick up     medications.  Presently, her blood pressure seems to be coming down.  The     patient she was also started on Norvasc.  5. Secondary hyperparathyroidism.  She is on Calcitriol and also a calcium     supplement as a phosphorus binder.   RECOMMENDATIONS:  I will add Norvasc to her treatment.  I would change her  Calcitriol levels to 75 mcg p.o. q.d.  I will continue with CAPD using 1.5%  Dianeal solution.  We will decrease her intravenous fluids possibly to 65 cc  per hour.  I will check an urinalysis and also changed her PD cell count and  follow for Gram's stain, and possibly we will hold her gentamycin if the  calcium does not show improvement.      ___________________________________________  Alison Murray, M.D.   BB/MEDQ  D:  09/01/2003  T:  09/01/2003  Job:  [id number]

## 2011-05-18 IMAGING — US US ART/VEN ABD/PELV/SCROTUM DOPPLER COMPLETE
1 series · 14 of 25 positions shown · non-contrast
Comparison: Today's abdominal ultrasound.

CLINICAL DATA: Abdominal pain.

DUPLEX ULTRASOUND OF LIVER
TECHNIQUE: Color and duplex Doppler ultrasound was performed to
evaluate the hepatic in-flow and out-flow vessels.

[Series 1: us art/ven abd/pelv/scrotum doppler complete · 0.31mm/px · 14 of 25 slices shown]
[im 1/25]
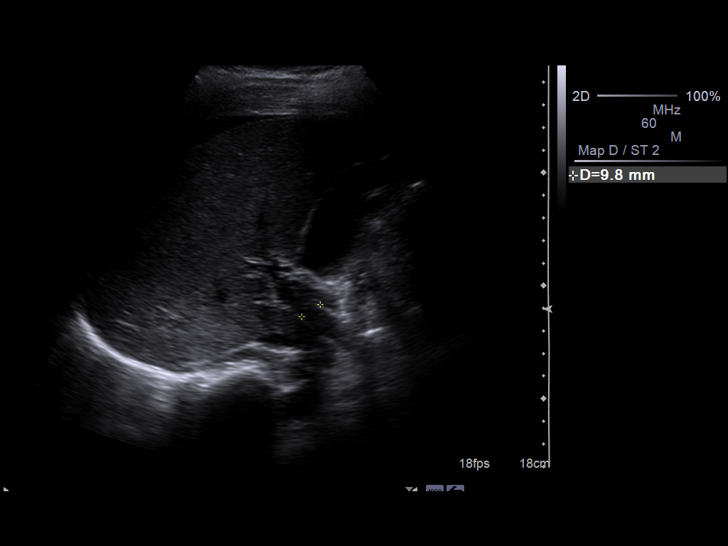
[im 3/25]
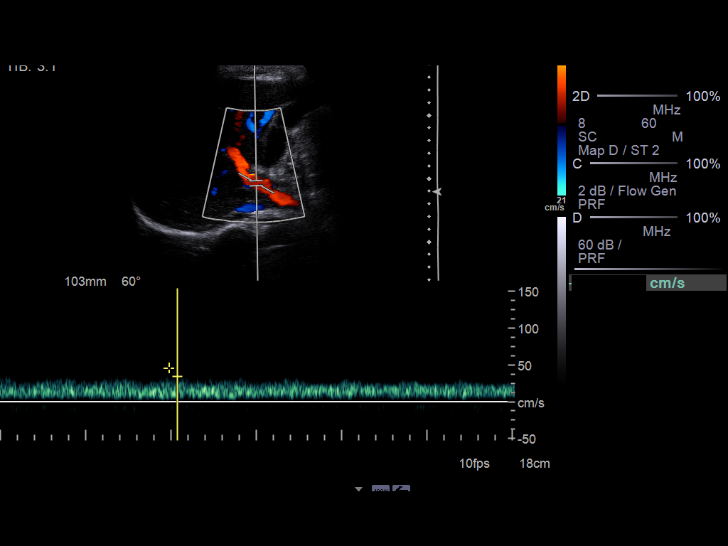
[im 5/25]
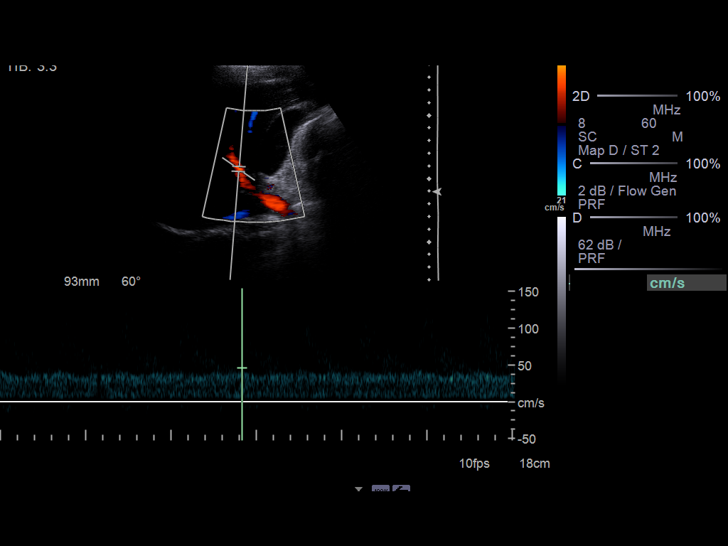
[im 7/25]
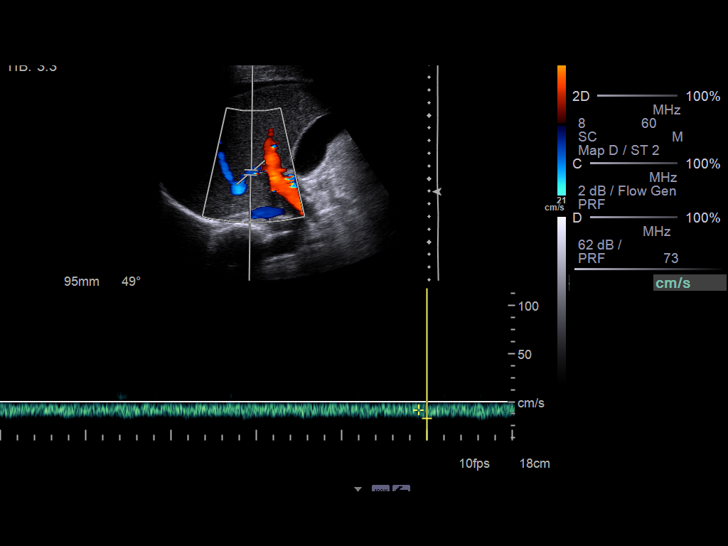
[im 9/25]
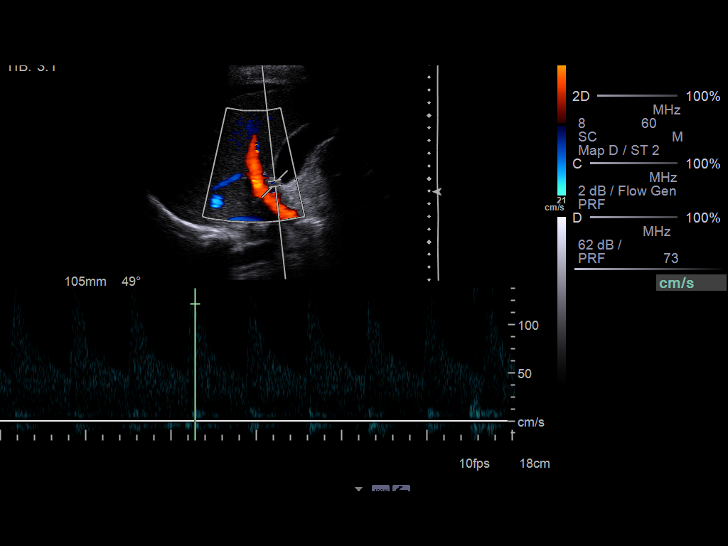
[im 10/25]
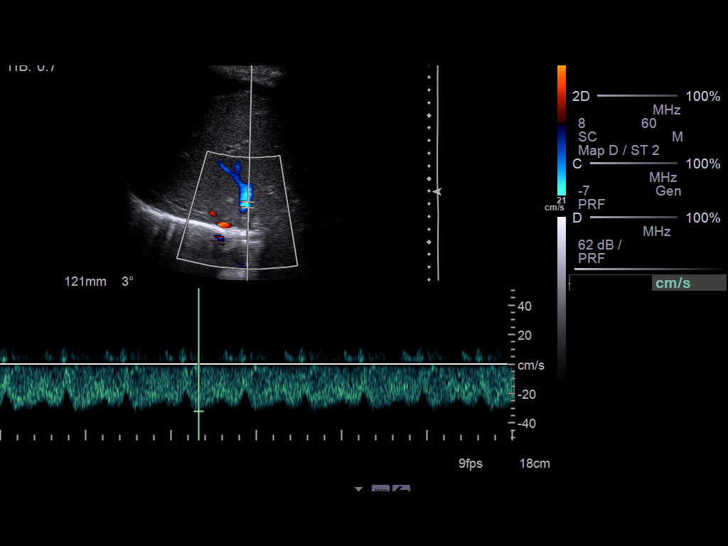
[im 12/25]
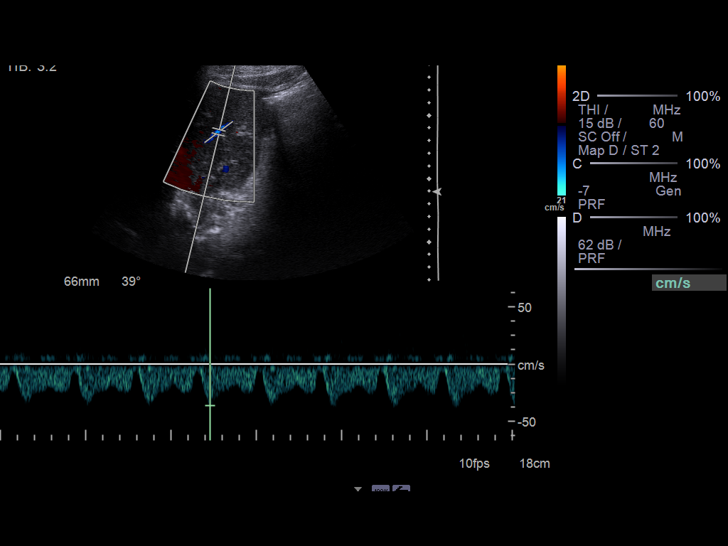
[im 14/25]
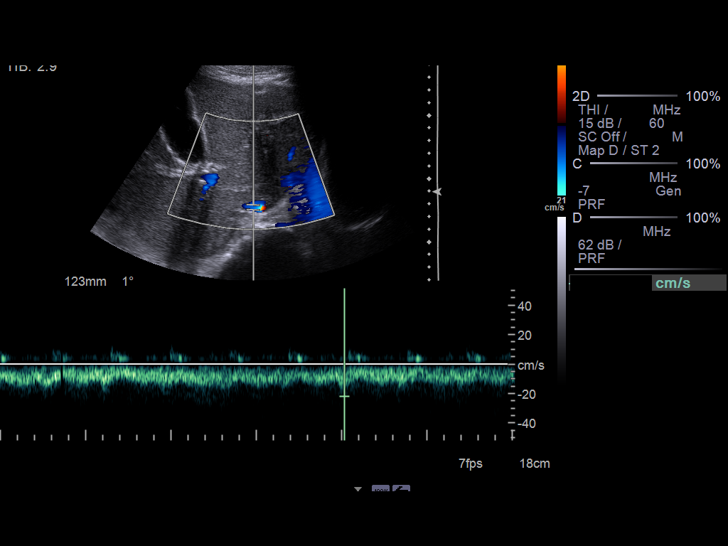
[im 16/25]
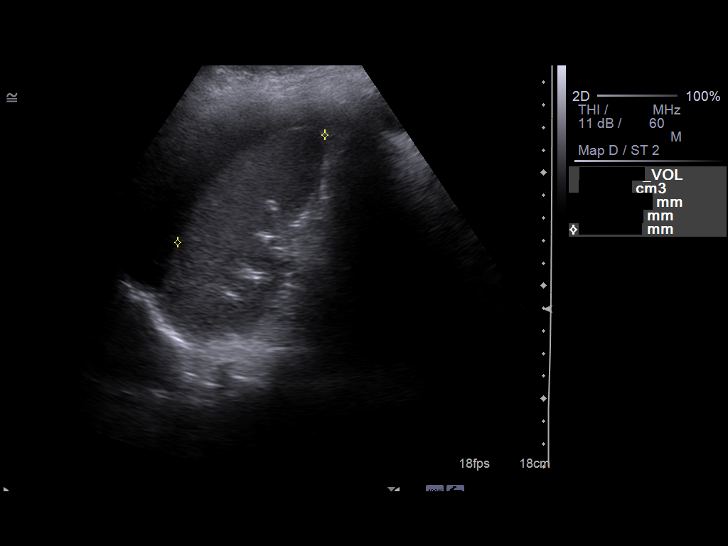
[im 17/25]
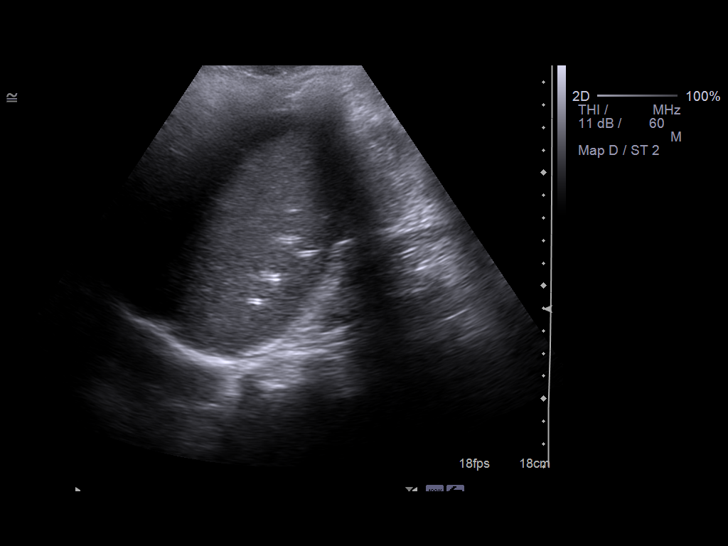
[im 19/25]
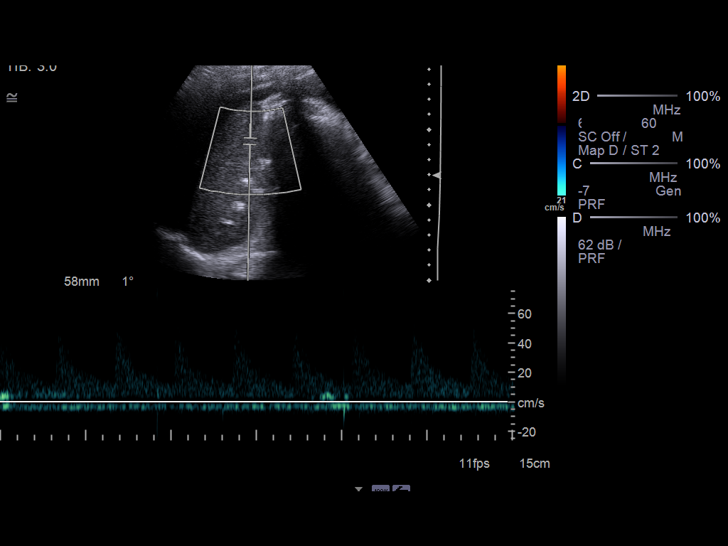
[im 21/25]
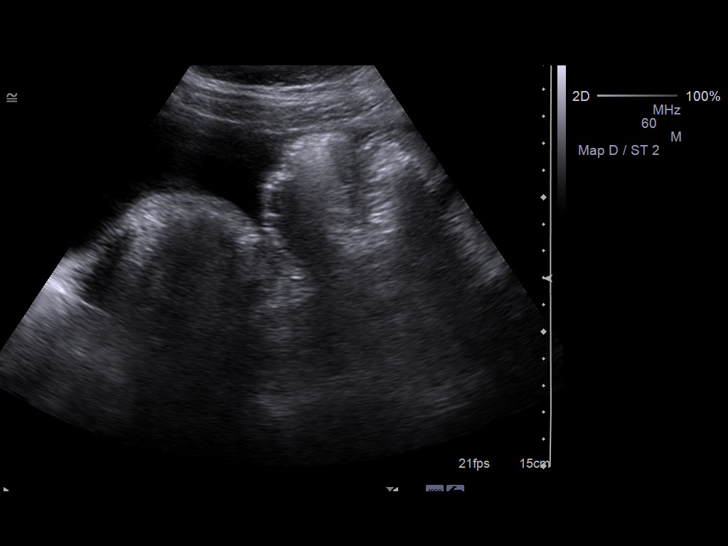
[im 23/25]
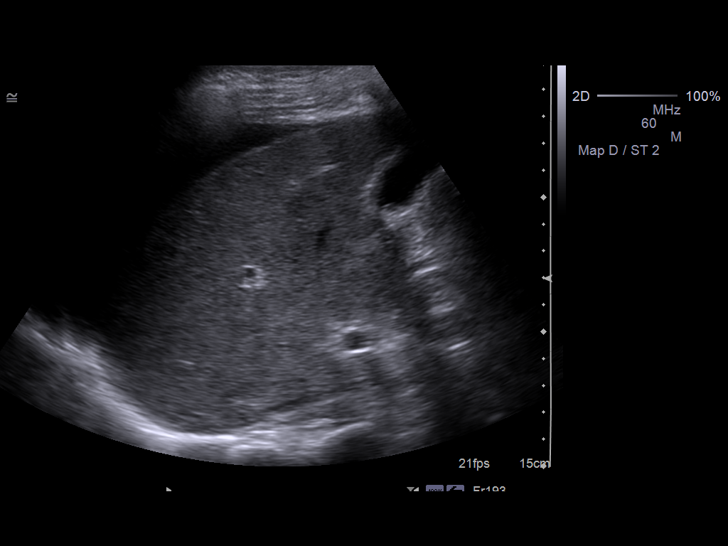
[im 25/25]
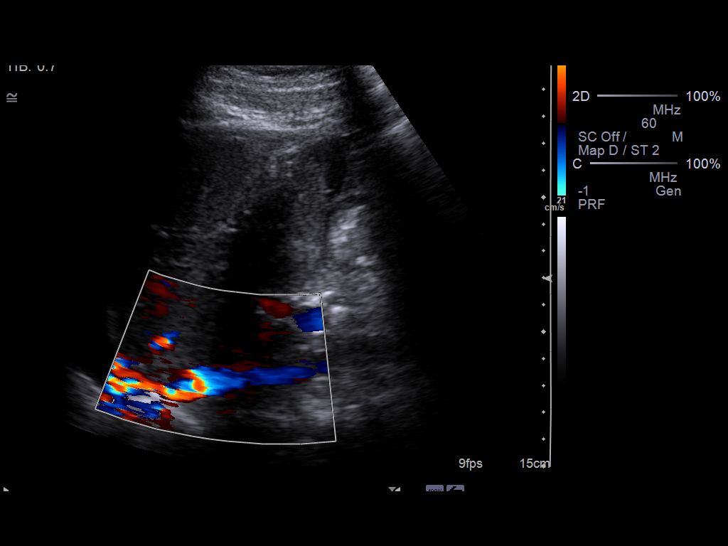

[14 of 25 positions shown; findings below may reference images not displayed]

Portal Vein Velocities:
Main:      47 cm/sec
Right:      16 cm/sec
Left:        23 cm/sec

Hepatic Vein Velocities:
Right:     22 cm/sec
Middle:  32 cm/sec
Left:       36 cm/sec

Hepatic Artery Velocity:   122 cm/sec
Splenic Vein Velocity:      14 cm/sec

Varices:   Absent
Ascites:   Present
FINDINGS: Portal vein is flowing in the hepatopetal direction.  The
hepatic vein flow is in the hepatic tubal direction.  No evidence
of portal vein or splenic vein thrombus or occlusion.  Spleen is
normal size.
IMPRESSION: Flow in the portal veins and hepatic veins is in the expected
direction.  No evidence of portal vein occlusion or thrombus.
Ascites present.

## 2011-05-18 IMAGING — US US ABDOMEN COMPLETE
1 series · 14 of 25 positions shown · non-contrast
Comparison: CT scan 08/25/2009

CLINICAL DATA: Abdominal pain

COMPLETE ABDOMINAL ULTRASOUND

[Series 1: us abdomen complete · 0.31mm/px · 14 of 75 slices shown]
[im 1/75]
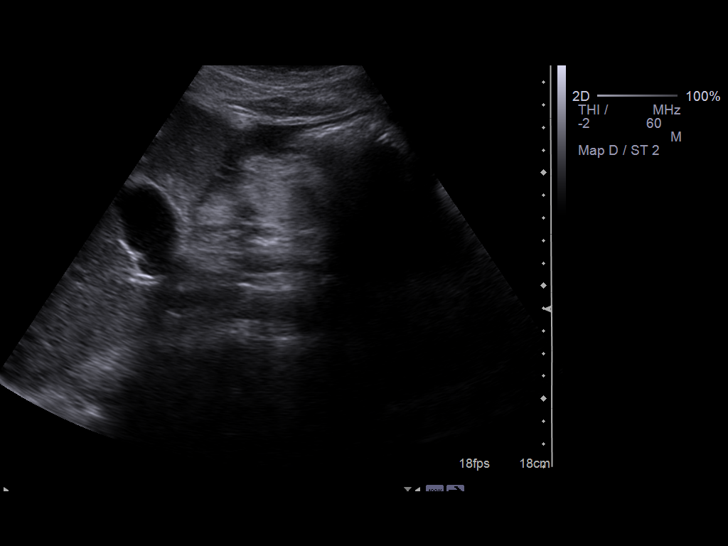
[im 7/75]
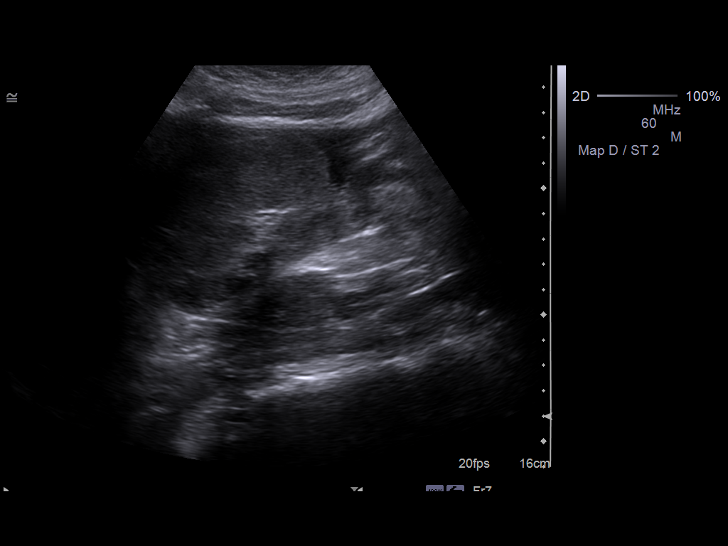
[im 13/75]
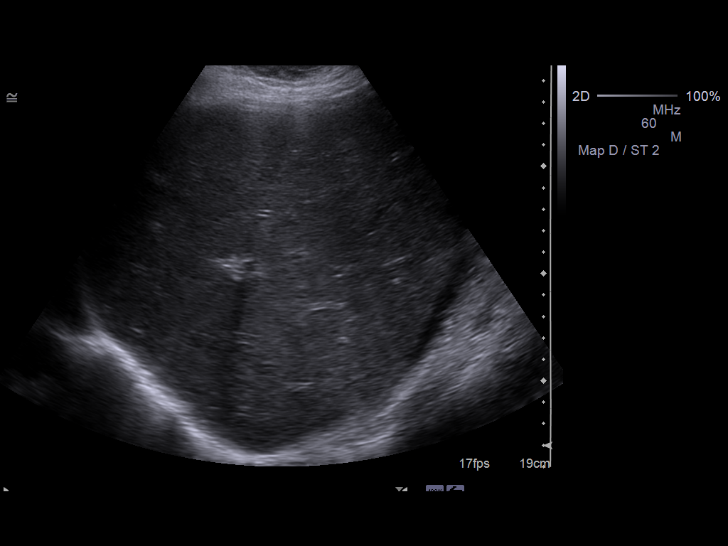
[im 19/75]
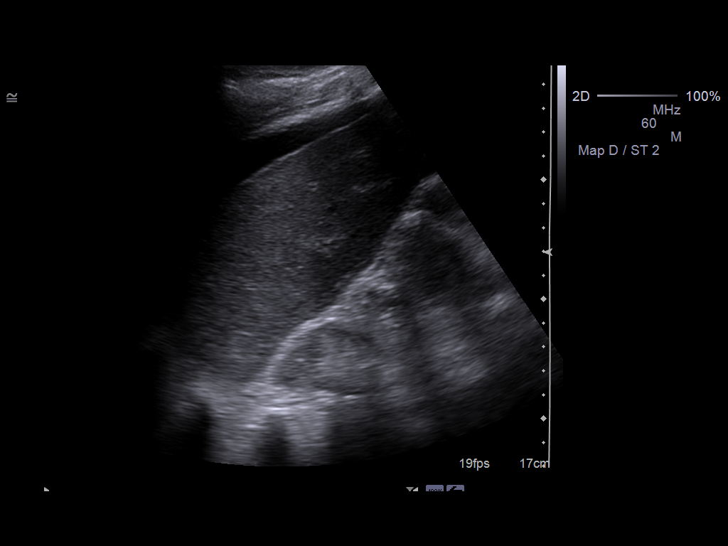
[im 25/75]
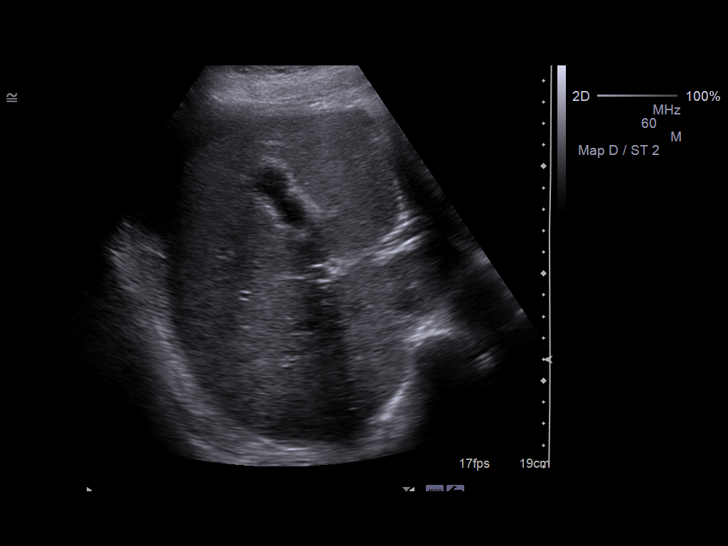
[im 28/75]
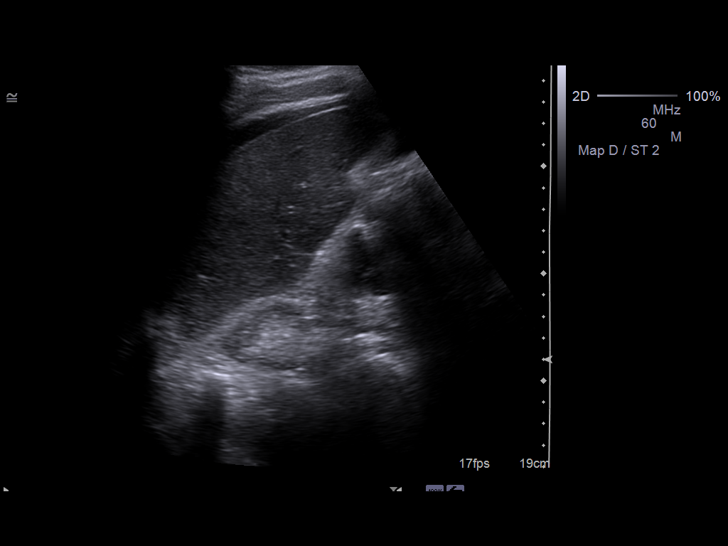
[im 34/75]
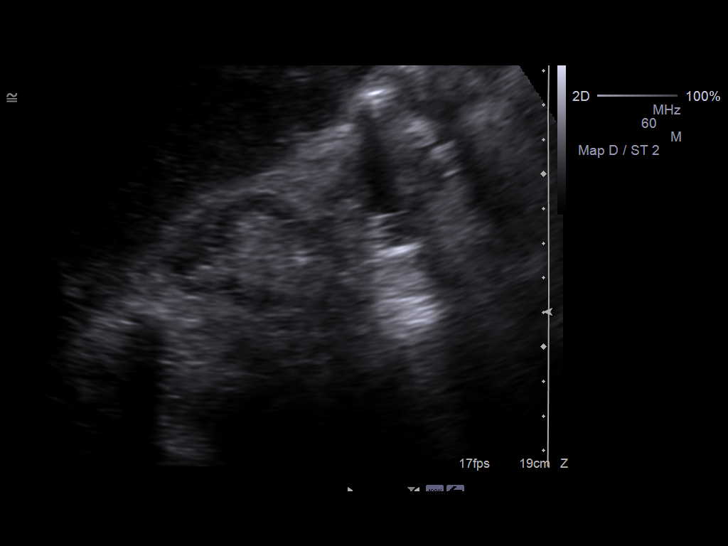
[im 41/75]
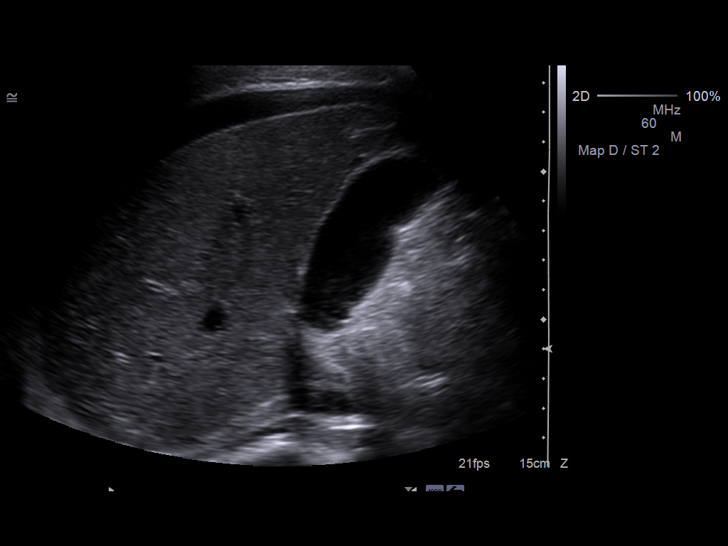
[im 47/75]
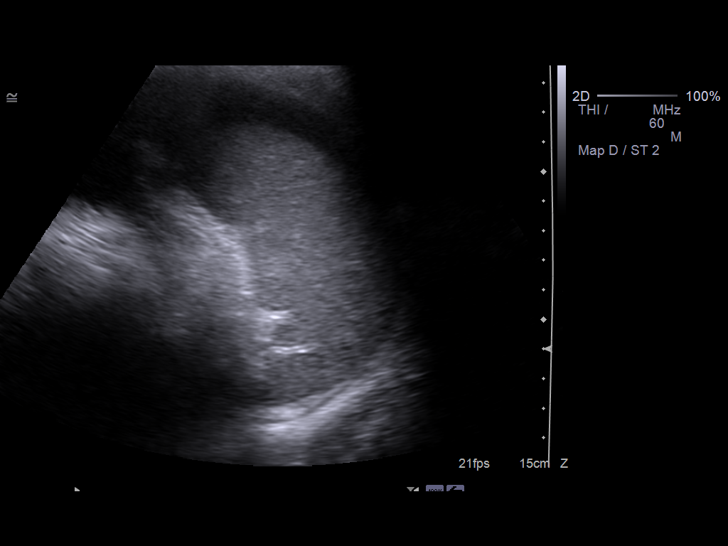
[im 50/75]
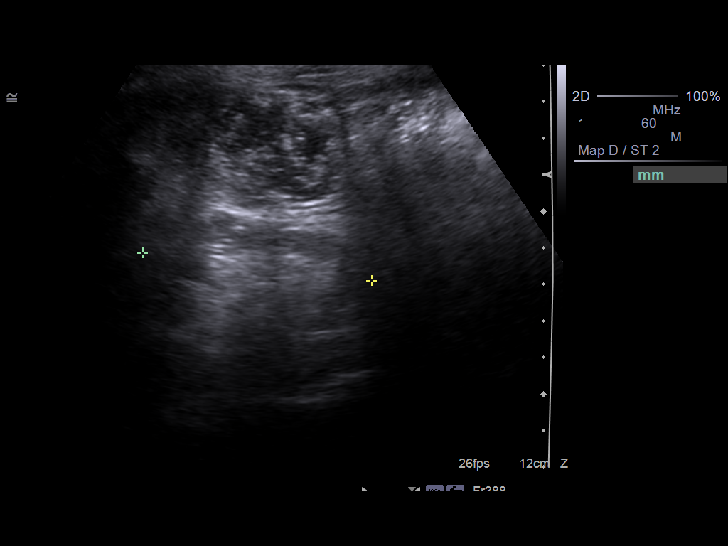
[im 56/75]
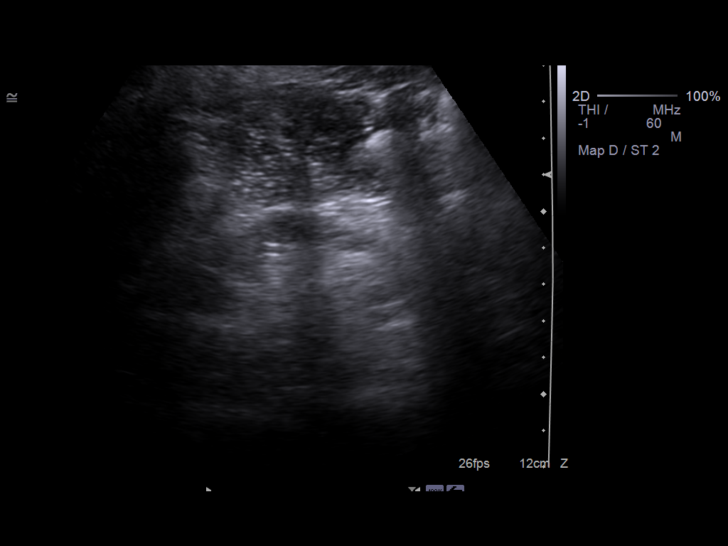
[im 62/75]
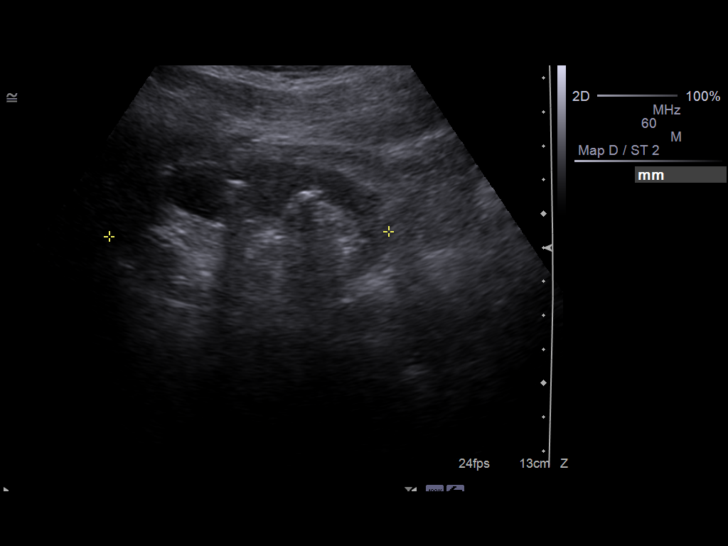
[im 68/75]
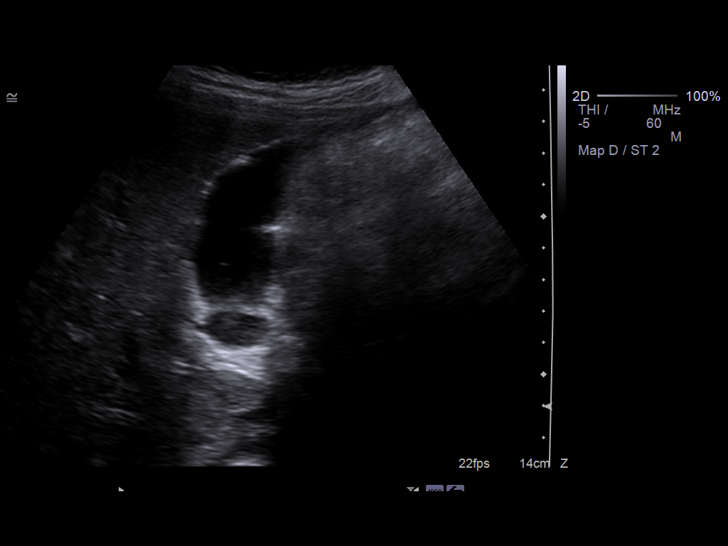
[im 75/75]
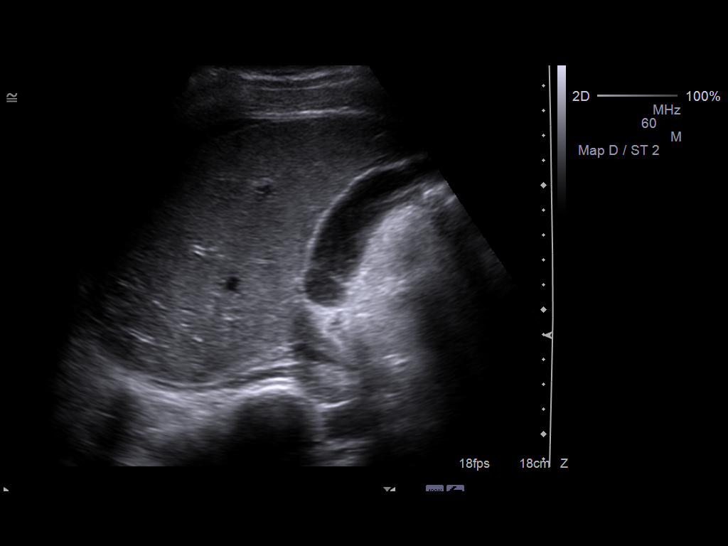

[14 of 25 positions shown; findings below may reference images not displayed]

FINDINGS: Abdominal ascites is noted.

Gallbladder:  No gallstones, gallbladder wall thickening, .  No
sonographic Murphy's sign . Small amount of layering sludge is
noted.

Common bile duct:  Within normal limits measures 4.7 mm in
diameter.

Liver:  No focal lesion identified.  Within normal limits in
parenchymal echogenicity.

IVC:  Appears normal.

Pancreas:  Not visualized due to ascites.

Spleen:  Measures 7.3 cm in length.

Right Kidney:  Measures 6.9 cm in length.  A 2.2 x 1.9 cm cyst is
noted in the mid pole.

Left Kidney:  Measures 8.2 cm in length.  There is a 1.8 x 1.3 cm
cyst in upper pole.  A nonobstructive calcification within the
upper pole left kidney measures 2.3 mm.  No hydronephrosis.

Abdominal aorta:  Measures up to 1.8 cm in length.
IMPRESSION: Abdominal ascites is noted.  Bilateral atrophic kidneys.  Bilateral
renal cyst.  A nonobstructive calcification is noted within left
kidney.  Small amount of layering sludge is noted within
gallbladder.  No shadowing gallstones.  Normal CBD.

## 2011-06-22 ENCOUNTER — Other Ambulatory Visit: Payer: Self-pay | Admitting: Obstetrics and Gynecology

## 2011-06-29 LAB — CBC
HCT: 21.9 — ABNORMAL LOW
HCT: 22.9 — ABNORMAL LOW
MCV: 91.2
Platelets: 279
Platelets: 288
Platelets: 289
RBC: 2.74 — ABNORMAL LOW
RDW: 16.5 — ABNORMAL HIGH
WBC: 12.3 — ABNORMAL HIGH
WBC: 18.8 — ABNORMAL HIGH
WBC: 8.6

## 2011-06-29 LAB — BODY FLUID CELL COUNT WITH DIFFERENTIAL
Eos, Fluid: 0
Eos, Fluid: 1
Lymphs, Fluid: 16
Lymphs, Fluid: 2
Monocyte-Macrophage-Serous Fluid: 59
Neutrophil Count, Fluid: 25
Total Nucleated Cell Count, Fluid: 11

## 2011-06-29 LAB — HEPATIC FUNCTION PANEL
AST: 16
Albumin: 2.1 — ABNORMAL LOW
Alkaline Phosphatase: 99
Total Bilirubin: 0.3

## 2011-06-29 LAB — RENAL FUNCTION PANEL
Albumin: 1.4 — ABNORMAL LOW
Albumin: 1.5 — ABNORMAL LOW
Albumin: 1.9 — ABNORMAL LOW
BUN: 22
Calcium: 7.6 — ABNORMAL LOW
Calcium: 8 — ABNORMAL LOW
GFR calc Af Amer: 6 — ABNORMAL LOW
GFR calc non Af Amer: 4 — ABNORMAL LOW
GFR calc non Af Amer: 5 — ABNORMAL LOW
Glucose, Bld: 256 — ABNORMAL HIGH
Phosphorus: 2.1 — ABNORMAL LOW
Phosphorus: 3.1
Phosphorus: 3.6
Potassium: 2.9 — ABNORMAL LOW
Potassium: 3.2 — ABNORMAL LOW
Potassium: 3.5
Sodium: 130 — ABNORMAL LOW
Sodium: 130 — ABNORMAL LOW
Sodium: 131 — ABNORMAL LOW

## 2011-06-29 LAB — CULTURE, BLOOD (ROUTINE X 2): Culture: NO GROWTH

## 2011-06-29 LAB — BODY FLUID CULTURE
Culture: NO GROWTH
Gram Stain: NONE SEEN

## 2011-06-29 LAB — BASIC METABOLIC PANEL
CO2: 23
CO2: 25
Calcium: 7.9 — ABNORMAL LOW
Calcium: 8.4
Creatinine, Ser: 10.84 — ABNORMAL HIGH
GFR calc Af Amer: 5 — ABNORMAL LOW
GFR calc Af Amer: 5 — ABNORMAL LOW
GFR calc non Af Amer: 4 — ABNORMAL LOW
Sodium: 130 — ABNORMAL LOW

## 2011-06-29 LAB — DIFFERENTIAL
Basophils Relative: 0
Basophils Relative: 0
Eosinophils Absolute: 0.1
Lymphs Abs: 0.7
Lymphs Abs: 1.8
Monocytes Relative: 7
Neutro Abs: 10.6 — ABNORMAL HIGH
Neutrophils Relative %: 86 — ABNORMAL HIGH
Neutrophils Relative %: 87 — ABNORMAL HIGH

## 2011-06-29 LAB — IRON: Iron: 58

## 2011-06-29 LAB — POCT I-STAT, CHEM 8
BUN: 28 — ABNORMAL HIGH
Creatinine, Ser: 10.4 — ABNORMAL HIGH
Glucose, Bld: >700
Hemoglobin: 8.8 — ABNORMAL LOW
Potassium: 4.5

## 2011-07-01 LAB — CBC
HCT: 30.8 — ABNORMAL LOW
HCT: 33.1 — ABNORMAL LOW
Hemoglobin: 10.3 — ABNORMAL LOW
Hemoglobin: 11.3 — ABNORMAL LOW
MCHC: 33.6
MCHC: 34.1
MCHC: 34.4
Platelets: 299
Platelets: 321
RBC: 2.94 — ABNORMAL LOW
RBC: 3.1 — ABNORMAL LOW
RDW: 15.8 — ABNORMAL HIGH
RDW: 15.8 — ABNORMAL HIGH
WBC: 20.1 — ABNORMAL HIGH
WBC: 9.5

## 2011-07-01 LAB — BODY FLUID CULTURE
Culture: NO GROWTH
Culture: NO GROWTH
Culture: NO GROWTH

## 2011-07-01 LAB — COMPREHENSIVE METABOLIC PANEL
ALT: 20
ALT: 23
AST: 13
AST: 14
Albumin: 1.8 — ABNORMAL LOW
Alkaline Phosphatase: 84
Alkaline Phosphatase: 84
CO2: 25
Calcium: 8.5
Chloride: 93 — ABNORMAL LOW
GFR calc Af Amer: 5 — ABNORMAL LOW
GFR calc Af Amer: 5 — ABNORMAL LOW
GFR calc non Af Amer: 4 — ABNORMAL LOW
Glucose, Bld: 507
Potassium: 3.2 — ABNORMAL LOW
Potassium: 4.4
Sodium: 126 — ABNORMAL LOW
Sodium: 130 — ABNORMAL LOW
Total Bilirubin: 0.9
Total Protein: 5.6 — ABNORMAL LOW

## 2011-07-01 LAB — RENAL FUNCTION PANEL
Albumin: 1.5 — ABNORMAL LOW
GFR calc Af Amer: 6 — ABNORMAL LOW
GFR calc non Af Amer: 5 — ABNORMAL LOW
Phosphorus: 5.6 — ABNORMAL HIGH
Potassium: 2.7 — CL
Sodium: 132 — ABNORMAL LOW

## 2011-07-01 LAB — DIFFERENTIAL
Basophils Absolute: 0
Basophils Relative: 0
Eosinophils Relative: 0
Lymphocytes Relative: 7 — ABNORMAL LOW
Monocytes Absolute: 1.4 — ABNORMAL HIGH

## 2011-07-01 LAB — BODY FLUID CELL COUNT WITH DIFFERENTIAL
Eos, Fluid: 8
Lymphs, Fluid: 1
Monocyte-Macrophage-Serous Fluid: 10 — ABNORMAL LOW
Monocyte-Macrophage-Serous Fluid: 11 — ABNORMAL LOW
Monocyte-Macrophage-Serous Fluid: 35 — ABNORMAL LOW
Neutrophil Count, Fluid: 87 — ABNORMAL HIGH
Total Nucleated Cell Count, Fluid: 58
Total Nucleated Cell Count, Fluid: 660

## 2011-07-01 LAB — LIPASE, BLOOD: Lipase: 34

## 2011-07-06 LAB — CBC
Hemoglobin: 9.3 — ABNORMAL LOW
MCHC: 33.4
MCHC: 34.2
MCV: 98.6
RBC: 2.77 — ABNORMAL LOW
RBC: 2.86 — ABNORMAL LOW

## 2011-07-06 LAB — HEPATIC FUNCTION PANEL
ALT: 14
AST: 17
Albumin: 2.6 — ABNORMAL LOW
Total Protein: 6.1

## 2011-07-06 LAB — BASIC METABOLIC PANEL
CO2: 27
CO2: 29
Calcium: 7.6 — ABNORMAL LOW
Calcium: 8.2 — ABNORMAL LOW
Creatinine, Ser: 11.43 — ABNORMAL HIGH
GFR calc Af Amer: 5 — ABNORMAL LOW
GFR calc non Af Amer: 4 — ABNORMAL LOW
Glucose, Bld: 442 — ABNORMAL HIGH
Sodium: 132 — ABNORMAL LOW
Sodium: 135

## 2011-07-06 LAB — BODY FLUID CULTURE
Culture: NO GROWTH
Gram Stain: NONE SEEN

## 2011-07-06 LAB — DIFFERENTIAL
Basophils Relative: 1
Eosinophils Absolute: 0.3
Lymphocytes Relative: 29
Monocytes Absolute: 0.6
Monocytes Relative: 6
Monocytes Relative: 9
Neutro Abs: 4.1
Neutrophils Relative %: 58

## 2011-07-06 LAB — CULTURE, BLOOD (ROUTINE X 2)

## 2011-07-06 LAB — GLUCOSE, CAPILLARY: Glucose-Capillary: 286 — ABNORMAL HIGH

## 2011-07-06 LAB — CULTURE, BODY FLUID W GRAM STAIN -BOTTLE
Culture: NO GROWTH
Report Status: 11152009

## 2011-07-06 LAB — MAGNESIUM: Magnesium: 1.2 — ABNORMAL LOW

## 2011-07-06 LAB — BODY FLUID CELL COUNT WITH DIFFERENTIAL: Monocyte-Macrophage-Serous Fluid: 2 — ABNORMAL LOW

## 2011-07-06 LAB — PHOSPHORUS
Phosphorus: 4.1
Phosphorus: 4.8 — ABNORMAL HIGH

## 2011-07-09 LAB — DIFFERENTIAL
Eosinophils Relative: 2
Lymphocytes Relative: 21
Lymphs Abs: 1.7
Monocytes Absolute: 0.6

## 2011-07-09 LAB — AMYLASE: Amylase: 77

## 2011-07-09 LAB — CLOSTRIDIUM DIFFICILE EIA: C difficile Toxins A+B, EIA: NEGATIVE

## 2011-07-09 LAB — BASIC METABOLIC PANEL
GFR calc Af Amer: 5 — ABNORMAL LOW
GFR calc non Af Amer: 4 — ABNORMAL LOW
Potassium: 3.5
Sodium: 138

## 2011-07-09 LAB — STOOL CULTURE

## 2011-07-09 LAB — CBC
HCT: 30.9 — ABNORMAL LOW
Hemoglobin: 10.6 — ABNORMAL LOW
RBC: 3.23 — ABNORMAL LOW
WBC: 8.1

## 2011-07-09 LAB — OVA AND PARASITE EXAMINATION

## 2011-09-30 ENCOUNTER — Observation Stay (HOSPITAL_COMMUNITY)
Admission: EM | Admit: 2011-09-30 | Discharge: 2011-10-02 | Disposition: A | Discharge status: Home / Self Care | Payer: Medicare Other | Attending: Internal Medicine | Admitting: Internal Medicine

## 2011-09-30 ENCOUNTER — Encounter: Payer: Self-pay | Admitting: Emergency Medicine

## 2011-09-30 DIAGNOSIS — N760 Acute vaginitis: Secondary | ICD-10-CM | POA: Diagnosis present

## 2011-09-30 DIAGNOSIS — Z940 Kidney transplant status: Secondary | ICD-10-CM | POA: Insufficient documentation

## 2011-09-30 DIAGNOSIS — A499 Bacterial infection, unspecified: Secondary | ICD-10-CM | POA: Insufficient documentation

## 2011-09-30 DIAGNOSIS — N390 Urinary tract infection, site not specified: Principal | ICD-10-CM | POA: Diagnosis present

## 2011-09-30 DIAGNOSIS — N12 Tubulo-interstitial nephritis, not specified as acute or chronic: Secondary | ICD-10-CM

## 2011-09-30 DIAGNOSIS — D72829 Elevated white blood cell count, unspecified: Secondary | ICD-10-CM | POA: Diagnosis present

## 2011-09-30 DIAGNOSIS — D631 Anemia in chronic kidney disease: Secondary | ICD-10-CM | POA: Diagnosis present

## 2011-09-30 DIAGNOSIS — E1122 Type 2 diabetes mellitus with diabetic chronic kidney disease: Secondary | ICD-10-CM | POA: Diagnosis present

## 2011-09-30 DIAGNOSIS — I10 Essential (primary) hypertension: Secondary | ICD-10-CM | POA: Diagnosis present

## 2011-09-30 DIAGNOSIS — B9689 Other specified bacterial agents as the cause of diseases classified elsewhere: Secondary | ICD-10-CM | POA: Insufficient documentation

## 2011-09-30 DIAGNOSIS — N186 End stage renal disease: Secondary | ICD-10-CM | POA: Diagnosis present

## 2011-09-30 DIAGNOSIS — E119 Type 2 diabetes mellitus without complications: Secondary | ICD-10-CM | POA: Insufficient documentation

## 2011-09-30 DIAGNOSIS — B9620 Unspecified Escherichia coli [E. coli] as the cause of diseases classified elsewhere: Secondary | ICD-10-CM | POA: Diagnosis present

## 2011-09-30 DIAGNOSIS — A498 Other bacterial infections of unspecified site: Secondary | ICD-10-CM | POA: Insufficient documentation

## 2011-09-30 DIAGNOSIS — A419 Sepsis, unspecified organism: Secondary | ICD-10-CM

## 2011-09-30 HISTORY — DX: Kidney transplant status: Z94.0

## 2011-09-30 HISTORY — DX: Tubulo-interstitial nephritis, not specified as acute or chronic: N12

## 2011-09-30 HISTORY — DX: Unspecified kidney failure: N19

## 2011-09-30 LAB — CBC
HCT: 45 % (ref 36.0–46.0)
HCT: 37.8 % (ref 36.0–46.0)
Hemoglobin: 13.9 g/dL (ref 12.0–15.0)
MCH: 29.7 pg (ref 26.0–34.0)
MCH: 30.6 pg (ref 26.0–34.0)
MCH: 29.7 pg (ref 26.0–34.0)
MCHC: 33.8 g/dL (ref 30.0–36.0)
MCHC: 32.3 g/dL (ref 30.0–36.0)
MCV: 91.9 fL (ref 78.0–100.0)
MCV: 90.7 fL (ref 78.0–100.0)
MCV: 92 fL (ref 78.0–100.0)
Platelets: 207 10*3/uL (ref 150–400)
Platelets: 183 10*3/uL (ref 150–400)
RBC: 4.68 MIL/uL (ref 3.87–5.11)
RDW: 13.4 % (ref 11.5–15.5)
RDW: 13.6 % (ref 11.5–15.5)
WBC: 18 10*3/uL — ABNORMAL HIGH (ref 4.0–10.5)
WBC: 17.6 10*3/uL — ABNORMAL HIGH (ref 4.0–10.5)

## 2011-09-30 LAB — URINALYSIS, ROUTINE W REFLEX MICROSCOPIC
Glucose, UA: NEGATIVE mg/dL
Specific Gravity, Urine: 1.022 (ref 1.005–1.030)
Urobilinogen, UA: 1 mg/dL (ref 0.0–1.0)
pH: 5 (ref 5.0–8.0)

## 2011-09-30 LAB — URINE MICROSCOPIC-ADD ON

## 2011-09-30 LAB — BASIC METABOLIC PANEL
BUN: 27 mg/dL — ABNORMAL HIGH (ref 6–23)
CO2: 20 mEq/L (ref 19–32)
GFR calc non Af Amer: 32 mL/min — ABNORMAL LOW (ref >90)
Glucose, Bld: 221 mg/dL — ABNORMAL HIGH (ref 70–99)
Potassium: 4.4 mEq/L (ref 3.5–5.1)

## 2011-09-30 LAB — DIFFERENTIAL
Eosinophils Absolute: 0 10*3/uL (ref 0.0–0.7)
Lymphocytes Relative: 4 % — ABNORMAL LOW (ref 12–46)
Lymphs Abs: 0.7 10*3/uL (ref 0.7–4.0)
Monocytes Relative: 11 % (ref 3–12)
Neutrophils Relative %: 85 % — ABNORMAL HIGH (ref 43–77)

## 2011-09-30 LAB — POCT PREGNANCY, URINE: Preg Test, Ur: NEGATIVE

## 2011-09-30 LAB — CREATININE, SERUM: GFR calc Af Amer: 33 mL/min — ABNORMAL LOW (ref >90)

## 2011-09-30 MED ORDER — TACROLIMUS 1 MG PO CAPS
4.0000 mg | ORAL_CAPSULE | Freq: Two times a day (BID) | ORAL | Status: DC
  Administered 2011-09-30 – 2011-10-01 (×2): 4 mg via ORAL
  Filled 2011-09-30 (×3): qty 4

## 2011-09-30 MED ORDER — MYCOPHENOLATE MOFETIL 250 MG PO CAPS
500.0000 mg | ORAL_CAPSULE | Freq: Three times a day (TID) | ORAL | Status: DC
  Administered 2011-09-30 – 2011-10-01 (×3): 500 mg via ORAL
  Filled 2011-09-30 (×5): qty 2

## 2011-09-30 MED ORDER — ACETAMINOPHEN 650 MG RE SUPP: 650.0000 mg | Freq: Four times a day (QID) | RECTAL | Status: DC | PRN

## 2011-09-30 MED ORDER — SODIUM CHLORIDE 0.9 % IV BOLUS (SEPSIS)
1000.0000 mL | Freq: Once | INTRAVENOUS | Status: AC
  Administered 2011-09-30: 1000 mL via INTRAVENOUS

## 2011-09-30 MED ORDER — PANTOPRAZOLE SODIUM 40 MG PO TBEC
40.0000 mg | DELAYED_RELEASE_TABLET | Freq: Every day | ORAL | Status: DC
  Administered 2011-10-01 – 2011-10-02 (×2): 40 mg via ORAL
  Filled 2011-09-30 (×2): qty 1

## 2011-09-30 MED ORDER — ACETAMINOPHEN 325 MG PO TABS
650.0000 mg | ORAL_TABLET | Freq: Once | ORAL | Status: AC
  Administered 2011-09-30: 650 mg via ORAL
  Filled 2011-09-30: qty 2

## 2011-09-30 MED ORDER — HEPARIN SODIUM (PORCINE) 5000 UNIT/ML IJ SOLN
5000.0000 [IU] | Freq: Three times a day (TID) | INTRAMUSCULAR | Status: DC
  Administered 2011-09-30 – 2011-10-02 (×5): 5000 [IU] via SUBCUTANEOUS
  Filled 2011-09-30 (×8): qty 1

## 2011-09-30 MED ORDER — SODIUM CHLORIDE 0.9 % IV SOLN
INTRAVENOUS | Status: AC
  Administered 2011-09-30 – 2011-10-01 (×2): via INTRAVENOUS

## 2011-09-30 MED ORDER — DEXTROSE 5 % IV SOLN
1.0000 g | INTRAVENOUS | Status: DC
  Administered 2011-10-01: 1 g via INTRAVENOUS
  Filled 2011-09-30 (×3): qty 10

## 2011-09-30 MED ORDER — ONDANSETRON HCL 4 MG PO TABS: 4.0000 mg | ORAL_TABLET | Freq: Four times a day (QID) | ORAL | Status: DC | PRN

## 2011-09-30 MED ORDER — METRONIDAZOLE IN NACL 5-0.79 MG/ML-% IV SOLN
500.0000 mg | Freq: Once | INTRAVENOUS | Status: AC
  Administered 2011-09-30: 500 mg via INTRAVENOUS
  Filled 2011-09-30: qty 100

## 2011-09-30 MED ORDER — AMLODIPINE BESYLATE 5 MG PO TABS
5.0000 mg | ORAL_TABLET | Freq: Every day | ORAL | Status: DC
  Administered 2011-09-30 – 2011-10-02 (×3): 5 mg via ORAL
  Filled 2011-09-30 (×3): qty 1

## 2011-09-30 MED ORDER — INSULIN ASPART 100 UNIT/ML ~~LOC~~ SOLN
0.0000 [IU] | Freq: Three times a day (TID) | SUBCUTANEOUS | Status: DC
  Administered 2011-09-30: 3 [IU] via SUBCUTANEOUS
  Administered 2011-10-01: 9 [IU] via SUBCUTANEOUS
  Filled 2011-09-30: qty 3

## 2011-09-30 MED ORDER — HYDROMORPHONE HCL PF 1 MG/ML IJ SOLN
1.0000 mg | INTRAMUSCULAR | Status: AC | PRN
  Administered 2011-09-30: 1 mg via INTRAVENOUS
  Filled 2011-09-30: qty 1

## 2011-09-30 MED ORDER — DEXTROSE 5 % IV SOLN
1.0000 g | Freq: Once | INTRAVENOUS | Status: AC
  Administered 2011-09-30: 1 g via INTRAVENOUS
  Filled 2011-09-30: qty 10

## 2011-09-30 MED ORDER — PREDNISONE 5 MG PO TABS
5.0000 mg | ORAL_TABLET | Freq: Every day | ORAL | Status: DC
  Administered 2011-09-30 – 2011-10-02 (×3): 5 mg via ORAL
  Filled 2011-09-30 (×4): qty 1

## 2011-09-30 MED ORDER — ACETAMINOPHEN 325 MG PO TABS
650.0000 mg | ORAL_TABLET | Freq: Four times a day (QID) | ORAL | Status: DC | PRN
  Administered 2011-09-30 – 2011-10-01 (×2): 650 mg via ORAL
  Filled 2011-09-30 (×2): qty 2

## 2011-09-30 MED ORDER — ONDANSETRON HCL 4 MG/2ML IJ SOLN
4.0000 mg | Freq: Once | INTRAMUSCULAR | Status: AC
  Administered 2011-09-30: 4 mg via INTRAVENOUS
  Filled 2011-09-30: qty 2

## 2011-09-30 MED ORDER — ONDANSETRON HCL 4 MG/2ML IJ SOLN: 4.0000 mg | Freq: Three times a day (TID) | INTRAMUSCULAR | Status: AC | PRN

## 2011-09-30 MED ORDER — INSULIN GLARGINE 100 UNIT/ML ~~LOC~~ SOLN
30.0000 [IU] | Freq: Every day | SUBCUTANEOUS | Status: DC
  Administered 2011-09-30 – 2011-10-01 (×2): 30 [IU] via SUBCUTANEOUS
  Filled 2011-09-30: qty 3

## 2011-09-30 MED ORDER — HYDROMORPHONE HCL PF 1 MG/ML IJ SOLN
1.0000 mg | Freq: Once | INTRAMUSCULAR | Status: AC
  Administered 2011-09-30: 1 mg via INTRAVENOUS
  Filled 2011-09-30: qty 1

## 2011-09-30 MED ORDER — ONDANSETRON HCL 4 MG/2ML IJ SOLN
4.0000 mg | Freq: Four times a day (QID) | INTRAMUSCULAR | Status: DC | PRN
  Administered 2011-10-01 – 2011-10-02 (×3): 4 mg via INTRAVENOUS
  Filled 2011-09-30 (×3): qty 2

## 2011-09-30 MED ORDER — INSULIN ASPART 100 UNIT/ML ~~LOC~~ SOLN: 0.0000 [IU] | Freq: Every day | SUBCUTANEOUS | Status: DC

## 2011-09-30 MED ORDER — MORPHINE SULFATE 4 MG/ML IJ SOLN
4.0000 mg | Freq: Once | INTRAMUSCULAR | Status: AC
  Administered 2011-09-30: 4 mg via INTRAVENOUS
  Filled 2011-09-30: qty 1

## 2011-09-30 MED ORDER — SODIUM CHLORIDE 0.9 % IV SOLN
INTRAVENOUS | Status: AC
  Administered 2011-09-30: 13:00:00 via INTRAVENOUS

## 2011-09-30 NOTE — ED Notes (Signed)
Pt given ice water. Ok per Dr. Dorna Mai

## 2011-09-30 NOTE — ED Notes (Signed)
Slight reddness and welt noted above IV site on right upper arm. PA notified.

## 2011-09-30 NOTE — ED Notes (Signed)
Attempted report to [facility].  Questions raised as to patient's acuity. Will page attending to evaluate patient prior to admitting to floor; pt needs adm orders.

## 2011-09-30 NOTE — ED Notes (Signed)
Family at bedside. 

## 2011-09-30 NOTE — ED Notes (Signed)
reddness and welt on upper right arm has resolved; none seen elsewhere on pt.

## 2011-09-30 NOTE — ED Notes (Signed)
IV positional. Placed on pump for infusion.

## 2011-09-30 NOTE — ED Notes (Signed)
PT. REPORTS RLQ PAIN WITH VOMITTTING , DENIES DIARRHEA , GENERALIZED WEAKNESS ,  HEADACHE / CHILLS.

## 2011-09-30 NOTE — ED Notes (Signed)
Pt okay to go to floor now per Dr. Dorna Mai. Tiad to see pt upon adm for orders.

## 2011-09-30 NOTE — H&P (Signed)
Chief Complaint: Fevers and right lower quadrant pain  History of Present Illness: Patient is a 35 year old African American woman history of diabetes, end-stage renal disease status post right lower quadrant transplant who presents for evaluation of fevers and right lower quadrant pain. Patient reports that she was in her usual state of health until about 2 days ago when she started to experience fevers with rigors, nausea, vomiting, and right lower quadrant pain. Reports that pain persisted and got so bad today that she had to come in for further evaluation. Denies any dysuria, hematuria. Has been taking medications as directed. No history of stones in the past. Reports most recent bowel movement was 2 days ago, although she has not had much in the way of by mouth intake since the pain started. Last menstrual period was approximately 3 weeks ago. No history of appendicitis.  In the emergency room, the patient was noted to be febrile to 103.3, blood pressure 118/64, heart rate 108, respirations 16, satting 90% on room air. Creatinine noted to be elevated. Urinalysis consistent with urinary tract infection. Gynecologic exam by emergency room reportedly negative for gynecologic etiologies. Nephrologist on call was reportedly consulted, admitted to the hospitalist service for further management.  Past Medical History  Diagnosis Date  . Diabetes mellitus   . S/P kidney transplant   . Renal failure     Past Surgical History  Procedure Date  . Cesarean section   . Tubal ligation     Allergies   Review of patient's allergies indicates no known allergies.  Home Medications    Current Outpatient Rx   Name  Route  Sig  Dispense  Refill   .  AMLODIPINE BESYLATE 5 MG PO TABS  Oral  Take 5 mg by mouth daily.     .  INSULIN GLARGINE 100 UNIT/ML [place] SOLN  Subcutaneous  Inject 30 Units into the skin at bedtime.     .  INSULIN ISOPHANE HUMAN 100 UNIT/ML [place] SUSP  Subcutaneous  Inject 1-15 Units into the  skin 3 (three) times daily before meals. Per sliding scale     .  MYCOPHENOLATE MOFETIL 250 MG PO CAPS  Oral  Take 500 mg by mouth 3 (three) times daily.     Marland Kitchen  OMEPRAZOLE 20 MG PO CPDR  Oral  Take 20 mg by mouth daily.     Marland Kitchen  PREDNISONE 5 MG PO TABS  Oral  Take 5 mg by mouth daily.     Marland Kitchen  TACROLIMUS 1 MG PO CAPS  Oral  Take 4 mg by mouth 2 (two) times daily.       Family History:  Noncontributory. Strong history of diabetes  Social History: Single Denies any tobacco, EtOH, illicits  Review of Systems: General: As per HPI Skin: No rashes or lacerations HEENT: No rhinorrhea, sore throat, dry mouth, hearing difficulties Pulmonary: No cough, wheezing, shortness of breath Cardivascular: No chest pain, dyspnea on exertion, palpitations, lightheaded/dizziness, paroxysmal nocturnal dyspnea, orthopnea Gastrointestinal: As per HPI Genitourinary: As per HPI Musculoskeletal: No muscle aches, pain. No arthritis Hematologic: No easy bruising or bleeding Neurologic: No headaches, vision changes, focal neurologic deficits Psychologic: No suicidial or homicidal ideation. No depression  Filed Vitals:   09/30/11 1415 09/30/11 1430 09/30/11 1435 09/30/11 1534  BP:  110/47  127/86  Pulse: 119 118  126  Temp:   103.1 F (39.5 C) 100.6 F (38.1 C)  TempSrc:   Oral Oral  Resp:    20  SpO2: 99%  100%  97%    Physical Exam: General: Alert and oriented x 3, diaphoretic Skin: No rashes, bruises HEENT: Head atraumatic, sclera anicertic, pupils equal and reactive to light, oropharynx moist with tonsils unremarkable Neck: Soft, no lymphadenopathy, thyromegaly, or bruits Chest: Clear to auscultation bilaterally, no wheezes, rales, or ronchi Heart: Regular rate and rhythm, normal S1/S2 no rubs, gallops, or murmurs Abdomen: Soft, scars in right lower quadrant, mildly tender to palpation right lower quadrant with no rebound, +bowel sounds, no other masses Extremities: No cyanosis, clubbing, or edema. 2+  radial and dorsalis pedis pulses bilaterally Neurologic: Grossly intact  Labs: CBC    Component Value Date/Time   WBC 17.6* 09/30/2011 0747   RBC 4.96 09/30/2011 0747   HGB 15.2* 09/30/2011 0747   HCT 45.0 09/30/2011 0747   PLT 207 09/30/2011 0747   MCV 90.7 09/30/2011 0747   MCH 30.6 09/30/2011 0747   MCHC 33.8 09/30/2011 0747   RDW 13.4 09/30/2011 0747   LYMPHSABS 0.7 09/30/2011 0558   MONOABS 2.0* 09/30/2011 0558   EOSABS 0.0 09/30/2011 0558   BASOSABS 0.0 09/30/2011 0558    BMET    Component Value Date/Time   NA 134* 09/30/2011 0558   K 4.4 09/30/2011 0558   CL 98 09/30/2011 0558   CO2 20 09/30/2011 0558   GLUCOSE 221* 09/30/2011 0558   BUN 27* 09/30/2011 0558   CREATININE 1.95* 09/30/2011 0558   CALCIUM 8.5 09/30/2011 0558   GFRNONAA 32* 09/30/2011 0558   GFRAA 37* 09/30/2011 0558    Urinalysis: Cloudy with large bili, positive nitries, large leuk esterase, few squamous cells  Impression/Plan: 35 year old African American woman history of diabetes, end-stage renal disease status post right lower quadrant transplant who presents for evaluation of fevers and right lower quadrant pain, with symptoms and findings most consistent with urosepsis.  Fevers and right lower quadrant pain: Most likely pyelonephritis/urosepsis - Admit to medicine - Continue ceftriaxone - Followup blood and urine cultures - Renal ultrasound to assess for possible postrenal etiologies - IV fluids with symptomatic control - Continue to monitor, given that although leading diagnosis is pyelonephritis/urosepsis, appendicitis is still on the differential.  Acute renal failure: Likely secondary to prerenal etiology and possible pyelonephritis - Management as above - Avoid nephrotoxins  Diabetes: - Continue home regimen with sliding scale insulin  Transplant kidney, hypertension: - Continue home medications  Fluid/electrolytes/nutrition: - IV fluids as above - Monitor electrolytes  daily - Full liquid diet for now, advancing as tolerated  Prophylaxis: - Heparin TID  CODE STATUS: Full code

## 2011-09-30 NOTE — ED Provider Notes (Signed)
Medical screening examination/treatment/procedure(s) were conducted as a shared visit with non-physician practitioner(s) and myself.  I personally evaluated the patient during the encounter.  Pt febrile, renal transplant patient with symptoms and UA suggestive of pyelonephritis.  Will need IV abx and admission.  Not septic appearing at this time, intact mentation.    CRITICAL CARE Performed by: Donzetta Matters   Total critical care time: 30 min  Critical care time was exclusive of separately billable procedures and treating other patients.  Critical care was necessary to treat or prevent imminent or life-threatening deterioration.  Critical care was time spent personally by me on the following activities: development of treatment plan with patient and/or surrogate as well as nursing, discussions with consultants, evaluation of patient's response to treatment, examination of patient, obtaining history from patient or surrogate, ordering and performing treatments and interventions, ordering and review of laboratory studies, ordering and review of radiographic studies, pulse oximetry and re-evaluation of patient's condition.     Jahira Swiss Y.   Saddie Benders. Yesika Rispoli, MD 09/30/11 1400

## 2011-09-30 NOTE — ED Notes (Signed)
Pt states that this started Christmas evening.

## 2011-09-30 NOTE — Consult Note (Signed)
ANTIBIOTIC CONSULT NOTE - INITIAL  Pharmacy Consult for Rocephin Indication: Pyelonephritis/Urosepsis  No Known Allergies  Vital Signs: Temp: 100.6 F (38.1 C) (12/27 1534) Temp src: Oral (12/27 1534) BP: 127/86 mmHg (12/27 1534) Pulse Rate: 126  (12/27 1534) Intake/Output from previous day:   Intake/Output from this shift: Total I/O In: 1050 [I.V.:1050] Out: -   Labs:  Basename 09/30/11 0747 09/30/11 0558  WBC 17.6* 18.0*  HGB 15.2* 13.9  PLT 207 224  LABCREA -- --  CREATININE -- 1.95*   The CrCl is unknown because both a height and weight (above a minimum accepted value) are required for this calculation. No results found for this basename: VANCOTROUGH:2,VANCOPEAK:2,VANCORANDOM:2,GENTTROUGH:2,GENTPEAK:2,GENTRANDOM:2,TOBRATROUGH:2,TOBRAPEAK:2,TOBRARND:2,AMIKACINPEAK:2,AMIKACINTROU:2,AMIKACIN:2, in the last 72 hours   Microbiology: Recent Results (from the past 720 hour(s))  WET PREP, GENITAL     Status: Abnormal   Collection Time   09/30/11  8:26 AM      Component Value Range Status Comment   Yeast, Wet Prep NONE SEEN  NONE SEEN  Final    Trich, Wet Prep NONE SEEN  NONE SEEN  Final    Clue Cells, Wet Prep MODERATE (*) NONE SEEN  Final    WBC, Wet Prep HPF POC FEW (*) NONE SEEN  Final     Medical History: Past Medical History  Diagnosis Date  . Diabetes mellitus   . S/P kidney transplant   . Renal failure     Medications:  Scheduled:    . sodium chloride   Intravenous STAT  . acetaminophen  650 mg Oral Once  . acetaminophen  650 mg Oral Once  . amLODipine  5 mg Oral Daily  . cefTRIAXone (ROCEPHIN)  IV  1 g Intravenous Once  . heparin  5,000 Units Subcutaneous Q8H  .  HYDROmorphone (DILAUDID) injection  1 mg Intravenous Once  . insulin aspart  0-5 Units Subcutaneous QHS  . insulin aspart  0-9 Units Subcutaneous TID WC  . insulin glargine  30 Units Subcutaneous QHS  . metronidazole  500 mg Intravenous Once  .  morphine injection  4 mg Intravenous Once    . mycophenolate  500 mg Oral TID  . ondansetron  4 mg Intravenous Once  . pantoprazole  40 mg Oral Q1200  . predniSONE  5 mg Oral Q breakfast  . sodium chloride  1,000 mL Intravenous Once  . sodium chloride  1,000 mL Intravenous Once  . tacrolimus  4 mg Oral BID   Infusions:    . sodium chloride     Anti-infectives     Start     Dose/Rate Route Frequency Ordered Stop   09/30/11 1200   metroNIDAZOLE (FLAGYL) IVPB 500 mg        500 mg 100 mL/hr over 60 Minutes Intravenous  Once 09/30/11 1156 09/30/11 1307   09/30/11 0930   cefTRIAXone (ROCEPHIN) 1 g in dextrose 5 % 50 mL IVPB        1 g 100 mL/hr over 30 Minutes Intravenous  Once 09/30/11 [id number] 09/30/11 1011         Assessment: Patient is a 35 year old African American woman history of diabetes, end-stage renal disease status post right lower quadrant transplant who presents for evaluation of fevers and right lower quadrant pain. She is to be treated for assumed pyelonephritis/urosepsis.   Plan:  1.  Rocephin 1 Gm IV q 24 hours. 2.  Follow cultures/sensitivities.  Jhett Fretwell, Craig Guess, Pharm.D. 09/30/2011 5:12 PM

## 2011-09-30 NOTE — ED Notes (Signed)
[id number] READY

## 2011-09-30 NOTE — ED Notes (Signed)
Pt shivering. Placed on O2 via [place] at 2l/min.

## 2011-09-30 NOTE — ED Provider Notes (Signed)
History    this is a 35 year old female presenting with chief complaints of right lower quadrant abdominal pain. Her pain is describes as sharp, waxing waning, with associated headache, chills, vomiting, and loss of appetite. Pain the symptom worse with movement. Nothing improves the pain. She has tried some Tylenol without relief. She denies chest pain or shortness of breath. She denies dysuria, vaginal discharge, or diarrhea. Her last bowel movement was 2 days ago. Patient has a history of renal failure status post kidney transplant since last year. She states she has no complication related to the kidney since surgery. She denies history of appendicitis.  CSN: [id number]  Arrival date & time 09/30/11  0546   First MD Initiated Contact with Patient 09/30/11 0654      Chief Complaint  Patient presents with  . Abdominal Pain    (Consider location/radiation/quality/duration/timing/severity/associated sxs/prior treatment) HPI  Past Medical History  Diagnosis Date  . Diabetes mellitus   . S/P kidney transplant   . Renal failure     Past Surgical History  Procedure Date  . Cesarean section   . Tubal ligation     No family history on file.  History  Substance Use Topics  . Smoking status: Never Smoker   . Smokeless tobacco: Not on file  . Alcohol Use: No    OB History    Grav Para Term Preterm Abortions TAB SAB Ect Mult Living                  Review of Systems  All other systems reviewed and are negative.    Allergies  Review of patient's allergies indicates no known allergies.  Home Medications   Current Outpatient Rx  Name Route Sig Dispense Refill  . AMLODIPINE BESYLATE 5 MG PO TABS Oral Take 5 mg by mouth daily.      . INSULIN GLARGINE 100 UNIT/ML [place] SOLN Subcutaneous Inject 30 Units into the skin at bedtime.      . INSULIN ISOPHANE HUMAN 100 UNIT/ML [place] SUSP Subcutaneous Inject 1-15 Units into the skin 3 (three) times daily before meals. Per sliding scale      . MYCOPHENOLATE MOFETIL 250 MG PO CAPS Oral Take 500 mg by mouth 3 (three) times daily.      Marland Kitchen OMEPRAZOLE 20 MG PO CPDR Oral Take 20 mg by mouth daily.      Marland Kitchen PREDNISONE 5 MG PO TABS Oral Take 5 mg by mouth daily.      Marland Kitchen TACROLIMUS 1 MG PO CAPS Oral Take 4 mg by mouth 2 (two) times daily.        BP 118/64  Pulse 108  Temp(Src) 100.2 F (37.9 C) (Oral)  Resp 16  SpO2 98%  LMP 09/16/2011  Physical Exam  Nursing note and vitals reviewed. Constitutional: She appears well-developed and well-nourished.       Awake, alert, nontoxic appearance  HENT:  Head: Atraumatic.  Mouth/Throat: Oropharynx is clear and moist.  Eyes: Conjunctivae are normal. Right eye exhibits no discharge. Left eye exhibits no discharge.  Neck: Neck supple.  Cardiovascular: Normal rate and regular rhythm.   Pulmonary/Chest: Effort normal. No respiratory distress. She has no wheezes. She has no rales. She exhibits no tenderness.  Abdominal: Soft. There is tenderness in the right lower quadrant. There is guarding and tenderness at McBurney's point. There is no rebound and no CVA tenderness. No hernia. Hernia confirmed negative in the ventral area, confirmed negative in the right inguinal area and confirmed negative  in the left inguinal area.  Genitourinary: There is no rash, tenderness or lesion on the right labia. There is no rash, tenderness or lesion on the left labia. Cervix exhibits no discharge. Right adnexum displays no tenderness. Left adnexum displays no tenderness. There is bleeding around the vagina. No erythema or tenderness around the vagina. No vaginal discharge found.  Musculoskeletal: She exhibits no tenderness.       Baseline ROM, no obvious new focal weakness  Lymphadenopathy:       Right: No inguinal adenopathy present.       Left: No inguinal adenopathy present.  Neurological:       Mental status and motor strength appears baseline for patient and situation  Skin: No rash noted.  Psychiatric: She  has a normal mood and affect.    ED Course  Procedures (including critical care time)  Labs Reviewed  CBC - Abnormal; Notable for the following:    WBC 18.0 (*)    All other components within normal limits  DIFFERENTIAL - Abnormal; Notable for the following:    Neutrophils Relative 85 (*)    Neutro Abs 15.3 (*)    Lymphocytes Relative 4 (*)    Monocytes Absolute 2.0 (*)    All other components within normal limits  BASIC METABOLIC PANEL  URINALYSIS, ROUTINE W REFLEX MICROSCOPIC  POCT PREGNANCY, URINE   No results found.   No diagnosis found.    MDM  Patient presents with right lower quadrant abdominal pain. She also has a significant kidney disease, status post kidney transplant. UA shows evidence of UTI and BV. IV rocephin given.  Will obtain labs and will consult renal doctor.  Acetaminophen given for fever.  Pain meds given.  I've discussed with my attending, who agrees with assessment and plan.  Low suspicion for appencititis at this time.    11:55 AM I have spoken with nephrology, Dr. Hassell Done, who request for pt to be admitted with [facility] for urosepsis.  I have consulted with [facility], who will admit pt under Team 7, med surg, Dr. Charlies Silvers.      Domenic Moras, PA 09/30/11 [phone]

## 2011-10-01 ENCOUNTER — Inpatient Hospital Stay (HOSPITAL_COMMUNITY): Payer: Medicare Other

## 2011-10-01 DIAGNOSIS — B9689 Other specified bacterial agents as the cause of diseases classified elsewhere: Secondary | ICD-10-CM | POA: Diagnosis present

## 2011-10-01 LAB — GLUCOSE, CAPILLARY
Glucose-Capillary: 394 mg/dL — ABNORMAL HIGH (ref 70–99)
Glucose-Capillary: 345 mg/dL — ABNORMAL HIGH (ref 70–99)

## 2011-10-01 LAB — GC/CHLAMYDIA PROBE AMP, GENITAL: Chlamydia, DNA Probe: NEGATIVE

## 2011-10-01 LAB — CBC
HCT: 34 % — ABNORMAL LOW (ref 36.0–46.0)
MCH: 30 pg (ref 26.0–34.0)
MCV: 88.8 fL (ref 78.0–100.0)
RDW: 13.4 % (ref 11.5–15.5)
WBC: 20 10*3/uL — ABNORMAL HIGH (ref 4.0–10.5)

## 2011-10-01 LAB — BASIC METABOLIC PANEL
BUN: 43 mg/dL — ABNORMAL HIGH (ref 6–23)
CO2: 20 mEq/L (ref 19–32)
Calcium: 7.5 mg/dL — ABNORMAL LOW (ref 8.4–10.5)
Chloride: 101 mEq/L (ref 96–112)
Creatinine, Ser: 2.48 mg/dL — ABNORMAL HIGH (ref 0.50–1.10)
Glucose, Bld: 369 mg/dL — ABNORMAL HIGH (ref 70–99)

## 2011-10-01 MED ORDER — ACETAMINOPHEN 325 MG PO TABS
650.0000 mg | ORAL_TABLET | Freq: Four times a day (QID) | ORAL | Status: DC | PRN
  Administered 2011-10-01: 650 mg via ORAL
  Filled 2011-10-01: qty 2

## 2011-10-01 MED ORDER — INSULIN ASPART 100 UNIT/ML ~~LOC~~ SOLN
0.0000 [IU] | Freq: Every day | SUBCUTANEOUS | Status: DC
  Administered 2011-10-01: 4 [IU] via SUBCUTANEOUS
  Filled 2011-10-01: qty 3

## 2011-10-01 MED ORDER — TACROLIMUS 1 MG PO CAPS
3.0000 mg | ORAL_CAPSULE | Freq: Two times a day (BID) | ORAL | Status: DC
  Administered 2011-10-01 – 2011-10-02 (×2): 3 mg via ORAL
  Filled 2011-10-01 (×3): qty 3

## 2011-10-01 MED ORDER — HYDROMORPHONE HCL PF 1 MG/ML IJ SOLN
1.0000 mg | INTRAMUSCULAR | Status: DC | PRN
  Administered 2011-10-01 – 2011-10-02 (×3): 1 mg via INTRAVENOUS
  Filled 2011-10-01 (×2): qty 1

## 2011-10-01 MED ORDER — MYCOPHENOLATE MOFETIL 250 MG PO CAPS
750.0000 mg | ORAL_CAPSULE | Freq: Every day | ORAL | Status: DC
  Administered 2011-10-01: 750 mg via ORAL
  Filled 2011-10-01 (×2): qty 3

## 2011-10-01 MED ORDER — ACETAMINOPHEN 650 MG RE SUPP: 650.0000 mg | Freq: Four times a day (QID) | RECTAL | Status: DC | PRN

## 2011-10-01 MED ORDER — HYDROMORPHONE HCL PF 1 MG/ML IJ SOLN
INTRAMUSCULAR | Status: AC
  Administered 2011-10-01: 1 mg via INTRAVENOUS
  Filled 2011-10-01: qty 1

## 2011-10-01 MED ORDER — INSULIN ASPART 100 UNIT/ML ~~LOC~~ SOLN
0.0000 [IU] | Freq: Three times a day (TID) | SUBCUTANEOUS | Status: DC
  Administered 2011-10-01: 11 [IU] via SUBCUTANEOUS
  Administered 2011-10-01: 15 [IU] via SUBCUTANEOUS
  Administered 2011-10-02: 5 [IU] via SUBCUTANEOUS
  Administered 2011-10-02: 8 [IU] via SUBCUTANEOUS
  Filled 2011-10-01: qty 3

## 2011-10-01 MED ORDER — MYCOPHENOLATE MOFETIL 250 MG PO CAPS
500.0000 mg | ORAL_CAPSULE | Freq: Every day | ORAL | Status: DC
  Administered 2011-10-02: 250 mg via ORAL
  Filled 2011-10-01 (×2): qty 2

## 2011-10-01 MED ORDER — METRONIDAZOLE IN NACL 5-0.79 MG/ML-% IV SOLN
500.0000 mg | Freq: Three times a day (TID) | INTRAVENOUS | Status: DC
  Administered 2011-10-01 – 2011-10-02 (×4): 500 mg via INTRAVENOUS
  Filled 2011-10-01 (×6): qty 100

## 2011-10-01 MED ORDER — ACETAMINOPHEN 325 MG PO TABS: 650.0000 mg | ORAL_TABLET | Freq: Four times a day (QID) | ORAL | Status: DC | PRN

## 2011-10-01 NOTE — Progress Notes (Signed)
Patient ID: [name], female   DOB: 08/15/1976, 35 y.o.   MRN: [id number] Subjective: No significant events overnight.  Objective:  Vital signs in last 24 hours:  Filed Vitals:   10/01/11 0000 10/01/11 0600 10/01/11 1040 10/01/11 1348  BP:  107/68  110/66  Pulse:  94  105  Temp: 100 F (37.8 C) 99 F (37.2 C) 99 F (37.2 C) 98.5 F (36.9 C)  TempSrc: Oral Oral Axillary Oral  Resp:  19  20  SpO2:  98%  93%    Intake/Output from previous day:   Intake/Output Summary (Last 24 hours) at 10/01/11 1651 Last data filed at 10/01/11 0700  Gross per 24 hour  Intake   1200 ml  Output      0 ml  Net   1200 ml    Physical Exam: General: Alert, awake, oriented x3, in no acute distress. HEENT: No bruits, no goiter. Moist mucous membranes, no scleral icterus, no conjunctival pallor. Heart: Regular rate and rhythm, S1/S2 +, no murmurs, rubs, gallops. Lungs: Clear to auscultation bilaterally. No wheezing, no rhonchi, no rales.  Abdomen: Soft, nontender, nondistended, positive bowel sounds. Extremities: No clubbing or cyanosis, no pitting edema,  positive pedal pulses. Neuro: Grossly nonfocal.  Lab Results:  Basic Metabolic Panel:    Component Value Date/Time   NA 132* 10/01/2011 1504   K 4.2 10/01/2011 1504   CL 101 10/01/2011 1504   CO2 20 10/01/2011 1504   BUN 43* 10/01/2011 1504   CREATININE 2.48* 10/01/2011 1504   GLUCOSE 369* 10/01/2011 1504   CALCIUM 7.5* 10/01/2011 1504   CBC:    Component Value Date/Time   WBC 20.0* 10/01/2011 1504   HGB 11.5* 10/01/2011 1504   HCT 34.0* 10/01/2011 1504   PLT 168 10/01/2011 1504   MCV 88.8 10/01/2011 1504   NEUTROABS 15.3* 09/30/2011 0558   LYMPHSABS 0.7 09/30/2011 0558   MONOABS 2.0* 09/30/2011 0558   EOSABS 0.0 09/30/2011 0558   BASOSABS 0.0 09/30/2011 0558      Lab 10/01/11 1504 09/30/11 1721 09/30/11 0747 09/30/11 0558  WBC 20.0* 18.8* 17.6* 18.0*  HGB 11.5* 12.2 15.2* 13.9  HCT 34.0* 37.8 45.0 43.0  PLT  168 183 207 224  MCV 88.8 92.0 90.7 91.9  MCH 30.0 29.7 30.6 29.7  MCHC 33.8 32.3 33.8 32.3  RDW 13.4 13.6 13.4 13.4  LYMPHSABS -- -- -- 0.7  MONOABS -- -- -- 2.0*  EOSABS -- -- -- 0.0  BASOSABS -- -- -- 0.0  BANDABS -- -- -- --    Lab 10/01/11 1504 09/30/11 1721 09/30/11 0558  NA 132* -- 134*  K 4.2 -- 4.4  CL 101 -- 98  CO2 20 -- 20  GLUCOSE 369* -- 221*  BUN 43* -- 27*  CREATININE 2.48* 2.16* 1.95*  CALCIUM 7.5* -- 8.5  MG -- -- --   No results found for this basename: INR:5,PROTIME:5 in the last 168 hours Cardiac markers: No results found for this basename: CK:3,CKMB:3,TROPONINI:3,MYOGLOBIN:3 in the last 168 hours No components found with this basename: POCBNP:3 Recent Results (from the past 240 hour(s))  URINE CULTURE     Status: Normal (Preliminary result)   Collection Time   09/30/11  8:25 AM      Component Value Range Status Comment   Specimen Description URINE, CLEAN CATCH   Final    Special Requests NONE   Final    Setup Time [id number]   Final    Colony Count >=100,000 COLONIES/ML  Final    Culture ESCHERICHIA COLI   Final    Report Status PENDING   Incomplete   WET PREP, GENITAL     Status: Abnormal   Collection Time   09/30/11  8:26 AM      Component Value Range Status Comment   Yeast, Wet Prep NONE SEEN  NONE SEEN  Final    Trich, Wet Prep NONE SEEN  NONE SEEN  Final    Clue Cells, Wet Prep MODERATE (*) NONE SEEN  Final    WBC, Wet Prep HPF POC FEW (*) NONE SEEN  Final     Studies/Results: US Renal  10/01/2011   IMPRESSION: Transplant kidney right lower quadrant.  No hydronephrosis or perinephric fluid.  Doppler evaluation not performed.  Circumferential bladder wall thickening.  Correlate with urinalysis as this can be seen with cystitis.  Partially imaged cystic lesion within the right adnexa, measuring approximally 4.8 cm.  Consider non emergent pelvic ultrasound follow-up.  Echogenic native kidneys without hydronephrosis.   Known cysts from  comparison CT are incompletely characterized on this examination.    Medications: Scheduled Meds:   . sodium chloride   Intravenous STAT  . amLODipine  5 mg Oral Daily  . cefTRIAXone (ROCEPHIN) IVPB 1 gram/50 mL D5W  1 g Intravenous Q24H  . heparin  5,000 Units Subcutaneous Q8H  . insulin aspart  0-15 Units Subcutaneous TID WC  . insulin aspart  0-5 Units Subcutaneous QHS  . insulin glargine  30 Units Subcutaneous QHS  . metronidazole  500 mg Intravenous Q8H  . mycophenolate  500 mg Oral QAC breakfast  . mycophenolate  750 mg Oral QHS  . pantoprazole  40 mg Oral Q1200  . predniSONE  5 mg Oral Q breakfast  . tacrolimus  3 mg Oral BID  . DISCONTD: insulin aspart  0-5 Units Subcutaneous QHS  . DISCONTD: insulin aspart  0-9 Units Subcutaneous TID WC  . DISCONTD: mycophenolate  500 mg Oral TID  . DISCONTD: tacrolimus  4 mg Oral BID   Continuous Infusions:   . sodium chloride 100 mL/hr at 10/01/11 0700   PRN Meds:.acetaminophen, acetaminophen, HYDROmorphone (DILAUDID) injection, HYDROmorphone (DILAUDID) injection, ondansetron (ZOFRAN) IV, ondansetron (ZOFRAN) IV, ondansetron, DISCONTD: acetaminophen, DISCONTD: acetaminophen, DISCONTD: acetaminophen  Assessment/Plan:  Principal Problem:   *Pyelonephritis - fever likely secondary to pyelonephritis - follow up the results of urine culture - continue IV ceftriaxone - follow up renal ultrasound findings  Active Problems:   DIABETES MELLITUS, TYPE II - sliding scale insulin - CBG monitoring  Bacterial Vaginosis - flagyl 500 mg IV every 8 hours  Transplant kidney, hypertension:  - continue tacrolimus, prednisone and cellcept   EDUCATION - test results and diagnostic studies were discussed with patient and pt's family who was present at the bedside - patient and family have verbalized the understanding - questions were answered at the bedside and contact information was provided for additional questions or concerns   LOS: 1  day   [place], Shamyra Farias 10/01/2011, 4:51 PM  [facility] Pager: [phone]

## 2011-10-01 NOTE — Progress Notes (Signed)
Inpatient Diabetes Program Recommendations  AACE/ADA: New Consensus Statement on Inpatient Glycemic Control (2009)  Target Ranges:  Prepandial:   less than 140 mg/dL      Peak postprandial:   less than 180 mg/dL (1-2 hours)      Critically ill patients:  140 - 180 mg/dL   Reason for Visit: CBG's today 399 and 394 mg/dL.  Patient is on home dose of Lantus. Inpatient Diabetes Program Recommendations Insulin - IV drip/GlucoStabilizer: If CBG's continue to be greater than 350 mg/dL, Consider IV insulin. Insulin - Basal: Titrate Lantus based on Fasting CBG's.

## 2011-10-01 NOTE — Clinical Documentation Improvement (Signed)
SEPSIS DOCUMENTATION QUERY  THIS DOCUMENT IS NOT A PERMANENT PART OF THE MEDICAL RECORD  TO RESPOND TO THE THIS QUERY, FOLLOW THE INSTRUCTIONS BELOW:  1. If needed, update documentation for the patient's encounter via the notes activity.  2. Access this query again and click edit on the [employer].  3. After updating, or not, click F2 to complete all highlighted (required) fields concerning your review. Select "additional documentation in the medical record" OR "no additional documentation provided".  4. Click Sign note button.  5. The deficiency will fall out of your In Basket *Please let us know if you are not able to complete this workflow by phone or e-mail (listed below).  Please update your documentation within the medical record to reflect your response to this query.                                                                                    10/01/11  Dear Dr. Charlies Silvers Rolley Sims,  In a better effort to capture your patient's severity of illness, reflect appropriate length of stay and utilization of resources, a review of the patient medical record has revealed the following indicators.   Based on your clinical judgment, please clarify and document in a progress note and/or discharge summary the clinical condition associated with the following supporting information: In responding to this query please exercise your independent judgment.  The fact that a query is asked, does not imply that any particular answer is desired or expected.    "Urosepsis" was documented in the H&P. Please consider the below (if your clinical findings/judgment agree) as you document the patient's diagnosis/condition(s) in the progress note and discharge summary. Thank you!  Possible Clinical Conditions?  - Sepsis due to UTI  - UTI  - Other condition (please document in the progress notes and/or discharge summary)  - Cannot Clinically determine at this time    Supporting  Information:  - H&P: Fevers and right lower quadrant pain:  Most likely pyelonephritis/urosepsis  - Admit to medicine  - Continue ceftriaxone  - Followup blood and urine cultures  - Renal ultrasound to assess for possible postrenal etiologies  - IV fluids with symptomatic control  - Continue to monitor, given that although leading diagnosis is pyelonephritis/urosepsis, appendicitis is still on the differential.  Acute renal failure:  Likely secondary to prerenal etiology and possible pyelonephritis  - Management as above  - Avoid nephrotoxins  -  Component WBC  Latest Ref Rng 4.0 - 10.5 K/uL  09/30/2011 18.0 (H)  09/30/2011 17.6 (H)  09/30/2011 18.8 (H)   -  Component Neutrophils Relative Neutrophils Absolute Lymphocytes Relative  Latest Ref Rng 43 - 77 % 1.7 - 7.7 K/uL 12 - 46 %  09/30/2011 85 (H) 15.3 (H) 4 (L)   - TMAX= 103.3 (oral)  - HR range= 108-126 until 12/27 2200 then 80 and 94.    Additional documentation in discharge summary upon review. SM    Thank You,  Araceli Bouche  Clinical Documentation Specialist [facility] [employer] Management [employer] Email: [email or web address]

## 2011-10-02 DIAGNOSIS — D72829 Elevated white blood cell count, unspecified: Secondary | ICD-10-CM | POA: Diagnosis present

## 2011-10-02 LAB — BASIC METABOLIC PANEL
BUN: 41 mg/dL — ABNORMAL HIGH (ref 6–23)
Calcium: 7.7 mg/dL — ABNORMAL LOW (ref 8.4–10.5)
GFR calc Af Amer: 30 mL/min — ABNORMAL LOW (ref >90)
GFR calc non Af Amer: 26 mL/min — ABNORMAL LOW (ref >90)
Glucose, Bld: 300 mg/dL — ABNORMAL HIGH (ref 70–99)
Potassium: 4.6 mEq/L (ref 3.5–5.1)
Sodium: 132 mEq/L — ABNORMAL LOW (ref 135–145)

## 2011-10-02 LAB — URINE CULTURE: Colony Count: >=100000

## 2011-10-02 LAB — CBC
MCH: 29.3 pg (ref 26.0–34.0)
MCHC: 32.5 g/dL (ref 30.0–36.0)
Platelets: 182 10*3/uL (ref 150–400)

## 2011-10-02 MED ORDER — CIPROFLOXACIN IN D5W 400 MG/200ML IV SOLN: 400.0000 mg | Freq: Two times a day (BID) | INTRAVENOUS | Status: DC

## 2011-10-02 MED ORDER — METRONIDAZOLE 500 MG PO TABS: 500.0000 mg | ORAL_TABLET | Freq: Three times a day (TID) | ORAL | Status: DC

## 2011-10-02 MED ORDER — CIPROFLOXACIN HCL 500 MG PO TABS: 500.0000 mg | ORAL_TABLET | Freq: Two times a day (BID) | ORAL | Status: DC

## 2011-10-02 MED ORDER — METRONIDAZOLE 500 MG PO TABS: 500.0000 mg | ORAL_TABLET | Freq: Three times a day (TID) | ORAL | Status: AC

## 2011-10-02 MED ORDER — CEPHALEXIN 500 MG PO CAPS: 500.0000 mg | ORAL_CAPSULE | Freq: Two times a day (BID) | ORAL | Status: AC

## 2011-10-02 MED ORDER — HYDROMORPHONE HCL 4 MG PO TABS: 4.0000 mg | ORAL_TABLET | ORAL | Status: DC | PRN

## 2011-10-02 MED ORDER — HYDROMORPHONE HCL 4 MG PO TABS: 4.0000 mg | ORAL_TABLET | ORAL | Status: AC | PRN

## 2011-10-02 NOTE — Discharge Summary (Signed)
Patient ID: [name] MRN: [id number] DOB/AGE: [birth year] 35 y.o.  Admit date: 09/30/2011 Discharge date: 10/02/2011  Primary Care Physician:  METZ,CHRISTINE, MD  Discharge Diagnoses:    Present on Admission:  .E. coli UTI .DIABETES MELLITUS, TYPE II .End stage renal disease .ANEMIA OF RENAL FAILURE .HYPERTENSION .Bacterial vaginosis .Leukocytosis  Principal Problem:  *E. coli UTI Active Problems:  Leukocytosis  DIABETES MELLITUS, TYPE II  ANEMIA OF RENAL FAILURE  End stage renal disease  HYPERTENSION  Bacterial vaginosis   Current Discharge Medication List    START taking these medications   Details  ciprofloxacin (CIPRO) 500 MG tablet Take 1 tablet (500 mg total) by mouth 2 (two) times daily. Qty: 20 tablet, Refills: 0    HYDROmorphone (DILAUDID) 4 MG tablet Take 1 tablet (4 mg total) by mouth every 4 (four) hours as needed for pain. Qty: 45 tablet, Refills: 0      CONTINUE these medications which have NOT CHANGED   Details  amLODipine (NORVASC) 5 MG tablet Take 5 mg by mouth daily.      insulin glargine (LANTUS) 100 UNIT/ML injection Inject 30 Units into the skin at bedtime.      insulin NPH (HUMULIN N,NOVOLIN N) 100 UNIT/ML injection Inject 1-15 Units into the skin 3 (three) times daily before meals. Per sliding scale     !! mycophenolate (CELLCEPT) 250 MG capsule Take 500 mg by mouth every morning.      !! mycophenolate (CELLCEPT) 250 MG capsule Take 750 mg by mouth every evening.      omeprazole (PRILOSEC) 20 MG capsule Take 20 mg by mouth daily.      predniSONE (DELTASONE) 5 MG tablet Take 5 mg by mouth daily.      tacrolimus (PROGRAF) 1 MG capsule Take 3 mg by mouth 2 (two) times daily.       !! - Potential duplicate medications found. Please discuss with provider.      Disposition and Follow-up: With PCP in one week  Consults:  none  Significant Diagnostic Studies:  No results found.  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 Patient reported that she was in her usual state of health until about 2 days prior to admission when she started to experience fevers with rigors, nausea, vomiting, and right lower quadrant pain. Reported that pain persisted and she had to come in for further evaluation. Denied any dysuria, hematuria. In the emergency room, the patient was noted to be febrile to 103.3. Patient was admitted to hospitalist service for further evaluation and management.    Physical Exam on Discharge:  Filed Vitals:   10/01/11 1700 10/01/11 2100 10/01/11 2225 10/02/11 0630  BP:  176/83 130/78 141/77  Pulse:  117  123  Temp:  99 F (37.2 C)  100.7 F (38.2 C)  TempSrc:  Oral  Oral  Resp:  20  20  Height: 5\' 5"  (1.651 m)     Weight: 97.523 kg (215 lb)     SpO2:  96%  92%    No intake or output data in the 24 hours ending 10/02/11 1255  General: Alert, awake, oriented x3, in no acute distress. HEENT: No bruits, no goiter. Heart: Regular rate and rhythm, without murmurs, rubs, gallops. Lungs: Clear to auscultation bilaterally. Abdomen: Soft, nontender, nondistended, positive bowel sounds. Extremities: No clubbing cyanosis or  edema with positive pedal pulses. Neuro: Grossly intact, nonfocal.  CBC:    Component Value Date/Time   WBC 16.3* 10/02/2011 0645   HGB 11.3* 10/02/2011 0645   HCT 34.8* 10/02/2011 0645   PLT 182 10/02/2011 0645   MCV 90.2 10/02/2011 0645   NEUTROABS 15.3* 09/30/2011 0558   LYMPHSABS 0.7 09/30/2011 0558   MONOABS 2.0* 09/30/2011 0558   EOSABS 0.0 09/30/2011 0558   BASOSABS 0.0 09/30/2011 [id number]    Basic Metabolic Panel:    Component Value Date/Time   NA 132* 10/02/2011 0645   K 4.6 10/02/2011 0645   CL 102 10/02/2011 0645   CO2 19 10/02/2011 0645   BUN 41* 10/02/2011 0645   CREATININE 2.31*  10/02/2011 0645   GLUCOSE 300* 10/02/2011 0645   CALCIUM 7.7* 10/02/2011 0645    Assessment/Plan:   Principal Problem:   *Pyelonephritis  - fever likely secondary to pyelonephritis  - Urine culture positive for E. Coli; almost pansensitive which include Cipro  - Patient reported ceftriaxone to make her nauseous so we will discontinue ceftriaxone and start ciprofloxacin  - Renal ultrasound with findings consistent with cystitis - Patient and insisted on being discharged home today, patient can take ciprofloxacin by mouth for 10 days upon discharge  Active Problems:   DIABETES MELLITUS, TYPE II  - continue home medication rgimen - CBG monitoring   Bacterial Vaginosis  - Continue Flagyl 500 mg every 8 hours PO for 7 days  Transplant kidney, hypertension:  - continue tacrolimus, prednisone and cellcept   EDUCATION  - test results and diagnostic studies were discussed with patient and pt's family who was present at the bedside  - patient and family have verbalized the understanding  - questions were answered at the bedside and contact information was provided for additional questions or concerns   Disposition - patient is medically stable and appears clinically well to be discharged home today    Time spent on Discharge: Greater than 30 minutes  Signed: Dominik Lauricella 10/02/2011, 12:55 PM

## 2011-10-02 NOTE — Progress Notes (Signed)
Patient ID: [name], female   DOB: 26-Feb-1976, 35 y.o.   MRN: [id number] Subjective: No events overnight. Patient denies chest pain, shortness of breath, abdominal pain.   Objective:  Vital signs in last 24 hours:  Filed Vitals:   10/01/11 1700 10/01/11 2100 10/01/11 2225 10/02/11 0630  BP:  176/83 130/78 141/77  Pulse:  117  123  Temp:  99 F (37.2 C)  100.7 F (38.2 C)  TempSrc:  Oral  Oral  Resp:  20  20  Height: 5\' 5"  (1.651 m)     Weight: 97.523 kg (215 lb)     SpO2:  96%  92%    Intake/Output from previous day:  No intake or output data in the 24 hours ending 10/02/11 1237  Physical Exam: General: Alert, awake, oriented x3, in no acute distress. HEENT: No bruits, no goiter. Moist mucous membranes, no scleral icterus, no conjunctival pallor. Heart: Regular rate and rhythm, S1/S2 +, no murmurs, rubs, gallops. Lungs: Clear to auscultation bilaterally. No wheezing, no rhonchi, no rales.  Abdomen: Soft, nontender, nondistended, positive bowel sounds. Extremities: No clubbing or cyanosis, no pitting edema,  positive pedal pulses. Neuro: Grossly nonfocal.  Lab Results:  Basic Metabolic Panel:    Component Value Date/Time   NA 132* 10/02/2011 0645   K 4.6 10/02/2011 0645   CL 102 10/02/2011 0645   CO2 19 10/02/2011 0645   BUN 41* 10/02/2011 0645   CREATININE 2.31* 10/02/2011 0645   GLUCOSE 300* 10/02/2011 0645   CALCIUM 7.7* 10/02/2011 0645   CBC:    Component Value Date/Time   WBC 16.3* 10/02/2011 0645   HGB 11.3* 10/02/2011 0645   HCT 34.8* 10/02/2011 0645   PLT 182 10/02/2011 0645   MCV 90.2 10/02/2011 0645   NEUTROABS 15.3* 09/30/2011 0558   LYMPHSABS 0.7 09/30/2011 0558   MONOABS 2.0* 09/30/2011 0558   EOSABS 0.0 09/30/2011 0558   BASOSABS 0.0 09/30/2011 0558      Lab 10/02/11 0645 10/01/11 1504 09/30/11 1721 09/30/11 0747 09/30/11 0558  WBC 16.3* 20.0* 18.8* 17.6* 18.0*  HGB 11.3* 11.5* 12.2 15.2* 13.9  HCT 34.8* 34.0* 37.8 45.0 43.0    PLT 182 168 183 207 224  MCV 90.2 88.8 92.0 90.7 91.9  MCH 29.3 30.0 29.7 30.6 29.7  MCHC 32.5 33.8 32.3 33.8 32.3  RDW 13.6 13.4 13.6 13.4 13.4  LYMPHSABS -- -- -- -- 0.7  MONOABS -- -- -- -- 2.0*  EOSABS -- -- -- -- 0.0  BASOSABS -- -- -- -- 0.0  BANDABS -- -- -- -- --    Lab 10/02/11 0645 10/01/11 1504 09/30/11 1721 09/30/11 0558  NA 132* 132* -- 134*  K 4.6 4.2 -- 4.4  CL 102 101 -- 98  CO2 19 20 -- 20  GLUCOSE 300* 369* -- 221*  BUN 41* 43* -- 27*  CREATININE 2.31* 2.48* 2.16* 1.95*  CALCIUM 7.7* 7.5* -- 8.5  MG -- -- -- --   No results found for this basename: INR:5,PROTIME:5 in the last 168 hours Cardiac markers: No results found for this basename: CK:3,CKMB:3,TROPONINI:3,MYOGLOBIN:3 in the last 168 hours No components found with this basename: POCBNP:3 Recent Results (from the past 240 hour(s))  URINE CULTURE     Status: Normal   Collection Time   09/30/11  8:25 AM      Component Value Range Status Comment   Specimen Description URINE, CLEAN CATCH   Final    Special Requests NONE   Final  Setup Time [id number]   Final    Colony Count >=100,000 COLONIES/ML   Final    Culture ESCHERICHIA COLI   Final    Report Status 10/02/2011 FINAL   Final    Organism ID, Bacteria ESCHERICHIA COLI   Final   WET PREP, GENITAL     Status: Abnormal   Collection Time   09/30/11  8:26 AM      Component Value Range Status Comment   Yeast, Wet Prep NONE SEEN  NONE SEEN  Final    Trich, Wet Prep NONE SEEN  NONE SEEN  Final    Clue Cells, Wet Prep MODERATE (*) NONE SEEN  Final    WBC, Wet Prep HPF POC FEW (*) NONE SEEN  Final   CULTURE, BLOOD (ROUTINE X 2)     Status: Normal (Preliminary result)   Collection Time   09/30/11  5:20 PM      Component Value Range Status Comment   Specimen Description BLOOD HAND LEFT   Final    Special Requests BOTTLES DRAWN AEROBIC ONLY 5CC   Final    Setup Time [id number]   Final    Culture     Final    Value:        BLOOD CULTURE RECEIVED NO  GROWTH TO DATE CULTURE WILL BE HELD FOR 5 DAYS BEFORE ISSUING A FINAL NEGATIVE REPORT   Report Status PENDING   Incomplete   CULTURE, BLOOD (ROUTINE X 2)     Status: Normal (Preliminary result)   Collection Time   09/30/11  5:26 PM      Component Value Range Status Comment   Specimen Description BLOOD HAND RIGHT   Final    Special Requests BOTTLES DRAWN AEROBIC ONLY 5CC   Final    Setup Time [id number]   Final    Culture     Final    Value:        BLOOD CULTURE RECEIVED NO GROWTH TO DATE CULTURE WILL BE HELD FOR 5 DAYS BEFORE ISSUING A FINAL NEGATIVE REPORT   Report Status PENDING   Incomplete     Studies/Results: US Renal  10/01/2011  *RADIOLOGY REPORT*  Clinical Data: Pyelonephritis transplant kidney.  RENAL/URINARY TRACT ULTRASOUND COMPLETE comparison:  01/20/2010 CT  The native kidneys are atrophic and echogenic.  There is a cyst within the upper pole of the left kidney, incompletely characterized, measuring 1.3 cm. The other cysts identified on comparison CT are not appreciated on today's examination.  Transplant kidney within the right lower quadrant.  No hydronephrosis or perinephric fluid.  Echogenicity is mildly heterogeneous.  The kidney measures 13.1 cm.  Note that Doppler evaluation/resistive index evaluation was not performed.  The bladder is circumferentially thickened.  No echogenic debris within the lumen.  Partially imaged right adnexal cyst, incompletely characterized, measures approximately 4.8 cm.  IMPRESSION: Transplant kidney right lower quadrant.  No hydronephrosis or perinephric fluid.  Doppler evaluation not performed.  Circumferential bladder wall thickening.  Correlate with urinalysis as this can be seen with cystitis.  Partially imaged cystic lesion within the right adnexa, measuring approximally 4.8 cm.  Consider non emergent pelvic ultrasound follow-up.  Echogenic native kidneys without hydronephrosis.   Known cysts from comparison CT are incompletely characterized on  this examination.  Original Report Authenticated By: Suanne Marker, M.D.    Medications: Scheduled Meds:   . amLODipine  5 mg Oral Daily  . ciprofloxacin  400 mg Intravenous Q12H  . heparin  5,000 Units Subcutaneous Q8H  .  insulin aspart  0-15 Units Subcutaneous TID WC  . insulin aspart  0-5 Units Subcutaneous QHS  . insulin glargine  30 Units Subcutaneous QHS  . metronidazole  500 mg Intravenous Q8H  . mycophenolate  500 mg Oral QAC breakfast  . mycophenolate  750 mg Oral QHS  . pantoprazole  40 mg Oral Q1200  . predniSONE  5 mg Oral Q breakfast  . tacrolimus  3 mg Oral BID  . DISCONTD: cefTRIAXone (ROCEPHIN) IVPB 1 gram/50 mL D5W  1 g Intravenous Q24H  . DISCONTD: mycophenolate  500 mg Oral TID  . DISCONTD: tacrolimus  4 mg Oral BID   Continuous Infusions:   . sodium chloride 100 mL/hr at 10/01/11 0700   PRN Meds:.acetaminophen, acetaminophen, HYDROmorphone (DILAUDID) injection, ondansetron (ZOFRAN) IV, ondansetron  Assessment/Plan:   Principal Problem:   *Pyelonephritis  - fever likely secondary to pyelonephritis  - Urine culture positive for E. Coli; almost pansensitive which include Cipro - Patient reported ceftriaxone to make her nauseous so we will discontinue ceftriaxone and start ciprofloxacin - follow up renal ultrasound findings   Active Problems:   DIABETES MELLITUS, TYPE II  - sliding scale insulin  - CBG monitoring   Bacterial Vaginosis  - flagyl 500 mg IV every 8 hours   Transplant kidney, hypertension:  - continue tacrolimus, prednisone and cellcept   EDUCATION  - test results and diagnostic studies were discussed with patient and pt's family who was present at the bedside  - patient and family have verbalized the understanding  - questions were answered at the bedside and contact information was provided for additional questions or concerns    LOS: 2 days   DEVINE, ALMA 10/02/2011, 12:37 PM  [facility] Pager:  [phone]

## 2011-10-07 LAB — CULTURE, BLOOD (ROUTINE X 2)
Culture  Setup Time: 201212280136
Culture: NO GROWTH

## 2013-02-07 ENCOUNTER — Other Ambulatory Visit: Payer: Self-pay | Admitting: Obstetrics and Gynecology

## 2013-03-06 ENCOUNTER — Encounter (HOSPITAL_BASED_OUTPATIENT_CLINIC_OR_DEPARTMENT_OTHER): Payer: Self-pay

## 2013-03-06 ENCOUNTER — Emergency Department (HOSPITAL_BASED_OUTPATIENT_CLINIC_OR_DEPARTMENT_OTHER)
Admission: EM | Admit: 2013-03-06 | Discharge: 2013-03-06 | Disposition: A | Discharge status: Home / Self Care | Payer: Managed Care, Other (non HMO) | Attending: Emergency Medicine | Admitting: Emergency Medicine

## 2013-03-06 DIAGNOSIS — J069 Acute upper respiratory infection, unspecified: Secondary | ICD-10-CM | POA: Insufficient documentation

## 2013-03-06 DIAGNOSIS — J309 Allergic rhinitis, unspecified: Secondary | ICD-10-CM

## 2013-03-06 DIAGNOSIS — Z79899 Other long term (current) drug therapy: Secondary | ICD-10-CM | POA: Insufficient documentation

## 2013-03-06 DIAGNOSIS — R6889 Other general symptoms and signs: Secondary | ICD-10-CM | POA: Insufficient documentation

## 2013-03-06 DIAGNOSIS — H1013 Acute atopic conjunctivitis, bilateral: Secondary | ICD-10-CM

## 2013-03-06 DIAGNOSIS — Z940 Kidney transplant status: Secondary | ICD-10-CM | POA: Insufficient documentation

## 2013-03-06 DIAGNOSIS — R0982 Postnasal drip: Secondary | ICD-10-CM | POA: Insufficient documentation

## 2013-03-06 DIAGNOSIS — J3489 Other specified disorders of nose and nasal sinuses: Secondary | ICD-10-CM | POA: Insufficient documentation

## 2013-03-06 DIAGNOSIS — Z87448 Personal history of other diseases of urinary system: Secondary | ICD-10-CM | POA: Insufficient documentation

## 2013-03-06 DIAGNOSIS — E119 Type 2 diabetes mellitus without complications: Secondary | ICD-10-CM | POA: Insufficient documentation

## 2013-03-06 DIAGNOSIS — Z794 Long term (current) use of insulin: Secondary | ICD-10-CM | POA: Insufficient documentation

## 2013-03-06 DIAGNOSIS — H1045 Other chronic allergic conjunctivitis: Secondary | ICD-10-CM | POA: Insufficient documentation

## 2013-03-06 DIAGNOSIS — J029 Acute pharyngitis, unspecified: Secondary | ICD-10-CM | POA: Insufficient documentation

## 2013-03-06 MED ORDER — PREDNISONE 20 MG PO TABS: ORAL_TABLET | ORAL | Status: DC

## 2013-03-06 MED ORDER — PREDNISONE 50 MG PO TABS
60.0000 mg | ORAL_TABLET | Freq: Once | ORAL | Status: AC
  Administered 2013-03-06: 60 mg via ORAL
  Filled 2013-03-06: qty 1

## 2013-03-06 NOTE — ED Notes (Signed)
Pt states that she had onset of sore throat, sneezing, coughing.  Today reports redness of the eyes, drainage.  Pt states that she believes her allergies are causing the issues.  Pt states that she has been taking benadryl with no relief.

## 2013-03-06 NOTE — Discharge Instructions (Signed)
Allergic Conjunctivitis  The conjunctiva is a thin membrane that covers the visible white part of the eyeball and the underside of the eyelids. This membrane protects and lubricates the eye. The membrane has small blood vessels running through it that can normally be seen. When the conjunctiva becomes inflamed, the condition is called conjunctivitis. In response to the inflammation, the conjunctival blood vessels become swollen. The swelling results in redness in the normally white part of the eye.  The blood vessels of this membrane also react when a person has allergies and is then called allergic conjunctivitis. This condition usually lasts for as long as the allergy persists. Allergic conjunctivitis cannot be passed to another person (non-contagious). The likelihood of bacterial infection is great and the cause is not likely due to allergies if the inflamed eye has:   A sticky discharge.   Discharge or sticking together of the lids in the morning.   Scaling or flaking of the eyelids where the eyelashes come out.   Red swollen eyelids.  CAUSES    Viruses.   Irritants such as foreign bodies.   Chemicals.   General allergic reactions.   Inflammation or serious diseases in the inside or the outside of the eye or the orbit (the boney cavity in which the eye sits) can cause a "red eye."  SYMPTOMS    Eye redness.   Tearing.   Itchy eyes.   Burning feeling in the eyes.   Clear drainage from the eye.   Allergic reaction due to pollens or ragweed sensitivity. Seasonal allergic conjunctivitis is frequent in the spring when pollens are in the air and in the fall.  DIAGNOSIS   This condition, in its many forms, is usually diagnosed based on the history and an ophthalmological exam. It usually involves both eyes. If your eyes react at the same time every year, allergies may be the cause. While most "red eyes" are due to allergy or an infection, the role of an eye (ophthalmological) exam is important. The exam  can rule out serious diseases of the eye or orbit.  TREATMENT    Non-antibiotic eye drops, ointments, or medications by mouth may be prescribed if the ophthalmologist is sure the conjunctivitis is due to allergies alone.   Over-the-counter drops and ointments for allergic symptoms should be used only after other causes of conjunctivitis have been ruled out, or as your caregiver suggests.  Medications by mouth are often prescribed if other allergy-related symptoms are present. If the ophthalmologist is sure that the conjunctivitis is due to allergies alone, treatment is normally limited to drops or ointments to reduce itching and burning.  HOME CARE INSTRUCTIONS    Wash hands before and after applying drops or ointments, or touching the inflamed eye(s) or eyelids.   Do not let the eye dropper tip or ointment tube touch the eyelid when putting medicine in your eye.   Stop using your soft contact lenses and throw them away. Use a new pair of lenses when recovery is complete. You should run through sterilizing cycles at least three times before use after complete recovery if the old soft contact lenses are to be used. Hard contact lenses should be stopped. They need to be thoroughly sterilized before use after recovery.   Itching and burning eyes due to allergies is often relieved by using a cool cloth applied to closed eye(s).  SEEK MEDICAL CARE IF:    Your problems do not go away after two or three days of treatment.     Your lids are sticky (especially in the morning when you wake up) or stick together.    Discharge develops. Antibiotics may be needed either as drops, ointment, or by mouth.   You have extreme light sensitivity.   An oral temperature above 102 F (38.9 C) develops.   Pain in or around the eye or any other visual symptom develops.  MAKE SURE YOU:    Understand these instructions.   Will watch your condition.   Will get help right away if you are not doing well or get worse.  Document Released: 12/11/2002 Document Revised: 12/13/2011 Document Reviewed: 11/06/2007  [facility] Patient Information 2014 [place], [place].  Allergic Rhinitis  Allergic rhinitis is when the mucous membranes in the nose respond to allergens. Allergens are particles in the air that cause your body to have an allergic reaction. This causes you to release allergic antibodies. Through a chain of events, these eventually cause you to release histamine into the blood stream (hence the use of antihistamines). Although meant to be protective to the body, it is this release that causes your discomfort, such as frequent sneezing, congestion and an itchy runny nose.   CAUSES   The pollen allergens may come from grasses, trees, and weeds. This is seasonal allergic rhinitis, or "hay fever." Other allergens cause year-round allergic rhinitis (perennial allergic rhinitis) such as house dust mite allergen, pet dander and mold spores.   SYMPTOMS    Nasal stuffiness (congestion).   Runny, itchy nose with sneezing and tearing of the eyes.   There is often an itching of the mouth, eyes and ears.  It cannot be cured, but it can be controlled with medications.  DIAGNOSIS   If you are unable to determine the offending allergen, skin or blood testing may find it.  TREATMENT    Avoid the allergen.   Medications and allergy shots (immunotherapy) can help.    Hay fever may often be treated with antihistamines in pill or nasal spray forms. Antihistamines block the effects of histamine. There are over-the-counter medicines that may help with nasal congestion and swelling around the eyes. Check with your caregiver before taking or giving this medicine.  If the treatment above does not work, there are many new medications your caregiver can prescribe. Stronger medications may be used if initial measures are ineffective. Desensitizing injections can be used if medications and avoidance fails. Desensitization is when a patient is given ongoing shots until the body becomes less sensitive to the allergen. Make sure you follow up with your caregiver if problems continue.  SEEK MEDICAL CARE IF:    You develop fever (more than 100.5 F (38.1 C).   You develop a cough that does not stop easily (persistent).   You have shortness of breath.   You start wheezing.   Symptoms interfere with normal daily activities.  Document Released: 06/15/2001 Document Revised: 12/13/2011 Document Reviewed: 12/25/2008  [facility] Patient Information 2014 [place], [place].

## 2013-03-06 NOTE — ED Provider Notes (Signed)
History     CSN: [id number]  Arrival date & time 03/06/13  1248   First MD Initiated Contact with Patient 03/06/13 1311      Chief Complaint  Patient presents with  . URI    (Consider location/radiation/quality/duration/timing/severity/associated sxs/prior treatment) HPI Comments: Pt with h/o seasonal allergies reports mild nasal drainage, sore throat and sneezing with clear discharge for a few days, woke up this AM with worse redness and eyelid swelling, itching.  No sig HA, no fevers, chills.  No sig coughing.  No N/V/D.  Pt has had similar in the past resulting in eyes so swollen, they were swollen shut, was treated with IM steroids and prednisone.  She has been using visine drops, was taking zyrtec and now benadryl without significant relief.  She also takes low dose steroids and also cellcept due to kidney transplant.    Patient is a 37 y.o. female presenting with URI. The history is provided by the patient.  URI Presenting symptoms: rhinorrhea and sore throat   Presenting symptoms: no cough and no fever   Associated symptoms: sneezing   Associated symptoms: no myalgias and no wheezing     Past Medical History  Diagnosis Date  . Diabetes mellitus   . S/P kidney transplant   . Renal failure   . Pyelonephritis 09/30/11    Past Surgical History  Procedure Laterality Date  . Cesarean section  1996  . Tubal ligation  2000  . Kidney transplant  03/2010    right  . Peritoneal catheter insertion  ~ 2003  . Loop av graft  05/2009    left upper arm    History reviewed. No pertinent family history.  History  Substance Use Topics  . Smoking status: Never Smoker   . Smokeless tobacco: Never Used  . Alcohol Use: No    OB History   Grav Para Term Preterm Abortions TAB SAB Ect Mult Living                  Review of Systems  Constitutional: Negative for fever and chills.  HENT: Positive for sore throat, rhinorrhea, sneezing and postnasal drip.   Eyes: Positive for  discharge, redness and itching.  Respiratory: Negative for cough, chest tightness, shortness of breath, wheezing and stridor.   Gastrointestinal: Negative for nausea, vomiting and diarrhea.  Musculoskeletal: Negative for myalgias.    Allergies  Review of patient's allergies indicates no known allergies.  Home Medications   Current Outpatient Rx  Name  Route  Sig  Dispense  Refill  . amLODipine (NORVASC) 5 MG tablet   Oral   Take 5 mg by mouth daily.           . insulin aspart protamine- aspart (NOVOLOG 70/30) (70-30) 100 UNIT/ML injection   Subcutaneous   Inject into the skin 2 (two) times daily. Take novolog 70/30 per sliding scale instructions twice daily.         . mycophenolate (CELLCEPT) 250 MG capsule   Oral   Take 500 mg by mouth every morning.           . mycophenolate (CELLCEPT) 250 MG capsule   Oral   Take 750 mg by mouth every evening.           Marland Kitchen omeprazole (PRILOSEC) 20 MG capsule   Oral   Take 20 mg by mouth daily.           . predniSONE (DELTASONE) 5 MG tablet   Oral   Take  5 mg by mouth daily.           . tacrolimus (PROGRAF) 1 MG capsule   Oral   Take 3 mg by mouth 2 (two) times daily.           . insulin glargine (LANTUS) 100 UNIT/ML injection   Subcutaneous   Inject 30 Units into the skin at bedtime.           . insulin NPH (HUMULIN N,NOVOLIN N) 100 UNIT/ML injection   Subcutaneous   Inject 1-15 Units into the skin 3 (three) times daily before meals. Per sliding scale          . predniSONE (DELTASONE) 20 MG tablet      Take 3 tablets by mouth daily for 4 days, then 2 tablets by mouth daily for next 3 days, then 1 tablet by mouth daily for 3 days   21 tablet   0     BP 133/86  Pulse 91  Temp(Src) 98.3 F (36.8 C) (Oral)  Resp 16  Ht 5\' 5"  (1.651 m)  Wt 206 lb (93.441 kg)  BMI 34.28 kg/m2  SpO2 98%  LMP 02/20/2013  Physical Exam  Nursing note and vitals reviewed. Constitutional: She appears well-developed and  well-nourished. No distress.  HENT:  Head: Normocephalic and atraumatic.  Mouth/Throat: No oropharyngeal exudate.  Eyes: EOM are normal. Pupils are equal, round, and reactive to light. Right eye exhibits chemosis and discharge. Right eye exhibits no hordeolum. Left eye exhibits chemosis and discharge. Left eye exhibits no hordeolum. Right conjunctiva is injected. Left conjunctiva is injected. No scleral icterus.  Mild periorbital edema  Neck: Normal range of motion. Neck supple.  Cardiovascular: Normal rate and intact distal pulses.   Pulmonary/Chest: Effort normal and breath sounds normal. No respiratory distress. She has no wheezes.  Abdominal: Soft. She exhibits no distension. There is no tenderness.  Neurological: She is alert.  Skin: Skin is warm and dry. No rash noted. She is not diaphoretic.  Psychiatric: She has a normal mood and affect.    ED Course  Procedures (including critical care time)  Labs Reviewed - No data to display No results found.   1. Allergic conjunctivitis of both eyes and rhinitis     ra sat is 98% and I interpret to be normal.  MDM  Pt with symptoms consistent with allergic rhinitis and conjunctivits.  Very symmetric exam.  No fevers, chills.  No sig cough to suggest even URI, certainly not pneumonia.  No HA.  Pt is on 5 mg prednisone already, will increase to 60 mg for 4 days, then taper to 40, then 20 mg.          Saddie Benders. Bellamie Turney, MD 03/06/13 1519

## 2014-02-01 ENCOUNTER — Encounter (HOSPITAL_COMMUNITY): Payer: Self-pay | Admitting: Emergency Medicine

## 2014-02-01 ENCOUNTER — Emergency Department (HOSPITAL_COMMUNITY)
Admission: EM | Admit: 2014-02-01 | Discharge: 2014-02-02 | Disposition: A | Discharge status: Home / Self Care | Payer: Managed Care, Other (non HMO) | Attending: Emergency Medicine | Admitting: Emergency Medicine

## 2014-02-01 DIAGNOSIS — J209 Acute bronchitis, unspecified: Secondary | ICD-10-CM | POA: Insufficient documentation

## 2014-02-01 DIAGNOSIS — E119 Type 2 diabetes mellitus without complications: Secondary | ICD-10-CM | POA: Insufficient documentation

## 2014-02-01 DIAGNOSIS — Z794 Long term (current) use of insulin: Secondary | ICD-10-CM | POA: Insufficient documentation

## 2014-02-01 DIAGNOSIS — Z79899 Other long term (current) drug therapy: Secondary | ICD-10-CM | POA: Insufficient documentation

## 2014-02-01 DIAGNOSIS — J40 Bronchitis, not specified as acute or chronic: Secondary | ICD-10-CM

## 2014-02-01 DIAGNOSIS — Z940 Kidney transplant status: Secondary | ICD-10-CM | POA: Insufficient documentation

## 2014-02-01 NOTE — ED Notes (Signed)
Pt. reports persistent /chronic productive cough and chest congestion for 3 weeks unrelieved by OTC /prescription cough and allergy medications . Denies fever or chills.

## 2014-02-02 ENCOUNTER — Emergency Department (HOSPITAL_COMMUNITY): Payer: Managed Care, Other (non HMO)

## 2014-02-02 MED ORDER — HYDROCODONE-ACETAMINOPHEN 7.5-325 MG/15ML PO SOLN: 15.0000 mL | Freq: Four times a day (QID) | ORAL | Status: DC | PRN

## 2014-02-02 MED ORDER — AZITHROMYCIN 250 MG PO TABS: ORAL_TABLET | ORAL | Status: DC

## 2014-02-02 MED ORDER — AZITHROMYCIN 250 MG PO TABS
500.0000 mg | ORAL_TABLET | Freq: Once | ORAL | Status: AC
  Administered 2014-02-02: 500 mg via ORAL
  Filled 2014-02-02: qty 2

## 2014-02-02 MED ORDER — HYDROCOD POLST-CHLORPHEN POLST 10-8 MG/5ML PO LQCR
5.0000 mL | Freq: Once | ORAL | Status: AC
  Administered 2014-02-02: 5 mL via ORAL
  Filled 2014-02-02: qty 5

## 2014-02-02 NOTE — Discharge Instructions (Signed)
Take Hycet  for cough and pain. Do not drink alcohol, drive, care for children or do other critical tasks while taking Hycet.   Take your antibiotics as directed and to completion. You should never have any leftover antibiotics! Push fluids and stay well hydrated.   Do not hesitate to return to the Emergency Department for any new, worsening or concerning symptoms.   If you do not have a primary care doctor you can establish one at the   [facility]: [place] [address] [phone]   Bronchitis Bronchitis is inflammation of the airways that extend from the windpipe into the lungs (bronchi). The inflammation often causes mucus to develop, which leads to a cough. If the inflammation becomes severe, it may cause shortness of breath. CAUSES  Bronchitis may be caused by:   Viral infections.   Bacteria.   Cigarette smoke.   Allergens, pollutants, and other irritants.  SIGNS AND SYMPTOMS  The most common symptom of bronchitis is a frequent cough that produces mucus. Other symptoms include:  Fever.   Body aches.   Chest congestion.   Chills.   Shortness of breath.   Sore throat.  DIAGNOSIS  Bronchitis is usually diagnosed through a medical history and physical exam. Tests, such as chest X-rays, are sometimes done to rule out other conditions.  TREATMENT  You may need to avoid contact with whatever caused the problem (smoking, for example). Medicines are sometimes needed. These may include:  Antibiotics. These may be prescribed if the condition is caused by bacteria.  Cough suppressants. These may be prescribed for relief of cough symptoms.   Inhaled medicines. These may be prescribed to help open your airways and make it easier for you to breathe.   Steroid medicines. These may be prescribed for those with recurrent (chronic) bronchitis. HOME CARE INSTRUCTIONS  Get plenty of rest.   Drink enough fluids to keep your urine  clear or pale yellow (unless you have a medical condition that requires fluid restriction). Increasing fluids may help thin your secretions and will prevent dehydration.   Only take over-the-counter or prescription medicines as directed by your health care provider.  Only take antibiotics as directed. Make sure you finish them even if you start to feel better.  Avoid secondhand smoke, irritating chemicals, and strong fumes. These will make bronchitis worse. If you are a smoker, quit smoking. Consider using nicotine gum or skin patches to help control withdrawal symptoms. Quitting smoking will help your lungs heal faster.   Put a cool-mist humidifier in your bedroom at night to moisten the air. This may help loosen mucus. Change the water in the humidifier daily. You can also run the hot water in your shower and sit in the bathroom with the door closed for 5 10 minutes.   Follow up with your health care provider as directed.   Wash your hands frequently to avoid catching bronchitis again or spreading an infection to others.  SEEK MEDICAL CARE IF: Your symptoms do not improve after 1 week of treatment.  SEEK IMMEDIATE MEDICAL CARE IF:  Your fever increases.  You have chills.   You have chest pain.   You have worsening shortness of breath.   You have bloody sputum.  You faint.  You have lightheadedness.  You have a severe headache.   You vomit repeatedly. MAKE SURE YOU:   Understand these instructions.  Will watch your condition.  Will get help right away if you are not doing  well or get worse. Document Released: 09/20/2005 Document Revised: 07/11/2013 Document Reviewed: 05/15/2013 [facility] Patient Information 2014 [place].   After you establish care. Let them know you were seen in the emergency room. They must obtain records for further management.

## 2014-02-02 NOTE — ED Notes (Signed)
Pt c/o productive cough for a few days now

## 2014-02-02 NOTE — ED Provider Notes (Signed)
CSN: [id number]     Arrival date & time 02/01/14  2334 History   First MD Initiated Contact with Patient 02/01/14 2358     Chief Complaint  Patient presents with  . Cough     (Consider location/radiation/quality/duration/timing/severity/associated sxs/prior Treatment) HPI  [name] is a 38 y.o. female complaining of dry cough and posttussive emesis over the course of 3 weeks. Patient denies fever, shortness of breath, chest pain, abdominal pain, change in bowel or bladder habits. 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  Past Medical History  Diagnosis Date  . Diabetes mellitus   . S/P kidney transplant   . Renal failure   . Pyelonephritis 09/30/11   Past Surgical History  Procedure Laterality Date  . Cesarean section  1996  . Tubal ligation  2000  . Kidney transplant  03/2010    right  . Peritoneal catheter insertion  ~ 2003  . Loop av graft  05/2009    left upper arm   No family history on file. History  Substance Use Topics  . Smoking status: Never Smoker   . Smokeless tobacco: Never Used  . Alcohol Use: No   OB History   Grav Para Term Preterm Abortions TAB SAB Ect Mult Living                 Review of Systems  10 systems reviewed and found to be negative, except as noted in the HPI.  Allergies  Review of patient's allergies indicates no known allergies.  Home Medications   Prior to Admission medications   Medication Sig Start Date End Date Taking? Authorizing Provider  amLODipine (NORVASC) 5 MG tablet Take 5 mg by mouth daily.      Historical Provider, MD  insulin aspart protamine- aspart (NOVOLOG 70/30) (70-30) 100 UNIT/ML injection Inject into the skin 2 (two) times daily. Take novolog 70/30 per sliding scale instructions twice daily.    Historical Provider, MD  insulin glargine (LANTUS) 100 UNIT/ML injection Inject 30 Units into the skin at bedtime.      Historical Provider, MD  insulin  NPH (HUMULIN N,NOVOLIN N) 100 UNIT/ML injection Inject 1-15 Units into the skin 3 (three) times daily before meals. Per sliding scale     Historical Provider, MD  mycophenolate (CELLCEPT) 250 MG capsule Take 500 mg by mouth every morning.      Historical Provider, MD  mycophenolate (CELLCEPT) 250 MG capsule Take 750 mg by mouth every evening.      Historical Provider, MD  omeprazole (PRILOSEC) 20 MG capsule Take 20 mg by mouth daily.      Historical Provider, MD  predniSONE (DELTASONE) 20 MG tablet Take 3 tablets by mouth daily for 4 days, then 2 tablets by mouth daily for next 3 days, then 1 tablet by mouth daily for 3 days 03/06/13   Saddie Benders. Ghim, MD  predniSONE (DELTASONE) 5 MG tablet Take 5 mg by mouth daily.      Historical Provider, MD  tacrolimus (PROGRAF) 1 MG capsule Take 3 mg by mouth 2 (two) times daily.      Historical Provider, MD   BP 120/82  Pulse 99  Temp(Src) 98.1 F (36.7 C) (Oral)  Resp 14  Ht 5\' 5"  (1.651 m)  Wt 216 lb (97.977 kg)  BMI 35.94 kg/m2  SpO2 98% Physical Exam  Nursing note and vitals reviewed. Constitutional: She is oriented to person, place, and time. She appears well-developed and well-nourished. No  distress.  HENT:  Head: Normocephalic and atraumatic.  Mouth/Throat: Oropharynx is clear and moist.  Eyes: Conjunctivae and EOM are normal. Pupils are equal, round, and reactive to light.  Neck: Normal range of motion.  Cardiovascular: Normal rate, regular rhythm and intact distal pulses.   Pulmonary/Chest: Effort normal and breath sounds normal. No stridor. No respiratory distress. She has no wheezes. She has no rales. She exhibits no tenderness.  Abdominal: Soft. Bowel sounds are normal. She exhibits no distension and no mass. There is no tenderness. There is no rebound and no guarding.  Musculoskeletal: Normal range of motion. She exhibits no edema and no tenderness.  Neurological: She is alert and oriented to person, place, and time.  Skin: Skin is  warm.  Psychiatric: She has a normal mood and affect.    ED Course  Procedures (including critical care time) Labs Review Labs Reviewed - No data to display  Imaging Review Dg Chest 2 View  02/02/2014   CLINICAL DATA:  Cough  EXAM: CHEST  2 VIEW  COMPARISON:  01/19/2010  FINDINGS: Mild lingular scarring. Lungs are otherwise clear. No pleural effusion or pneumothorax.  The heart is normal in size.  Visualized osseous structures are within normal limits.  IMPRESSION: No evidence of acute cardiopulmonary disease.   Electronically Signed   By: Julian Hy M.D.   On: 02/02/2014 01:08     EKG Interpretation None      MDM   Final diagnoses:  Bronchitis    Filed Vitals:   02/01/14 2347 02/02/14 0114  BP: 120/82 128/72  Pulse: 99 86  Temp: 98.1 F (36.7 C) 98.4 F (36.9 C)  TempSrc: Oral Oral  Resp: 14 18  Height: 5\' 5"  (1.651 m)   Weight: 216 lb (97.977 kg)   SpO2: 98% 98%    Medications  chlorpheniramine-HYDROcodone (TUSSIONEX) 10-8 MG/5ML suspension 5 mL (5 mLs Oral Given 02/02/14 0116)  azithromycin (ZITHROMAX) tablet 500 mg (500 mg Oral Given 02/02/14 0115)    [name] is a 38 y.o. female presenting with dry cough for 3 weeks. Lung sounds are clear and chest x-ray shows no infiltrate. Patient Joyce be treated with a Z-Pak and cough suppressant for an acute bronchitis. Return precautions discussed. Patient does not have a local primary care physician I expressed to her the importance of having a local doctor with her complex medical issues. Patient verbalized understanding.   Evaluation does not show pathology that would require ongoing emergent intervention or inpatient treatment. Pt is hemodynamically stable and mentating appropriately. Discussed findings and plan with patient/guardian, who agrees with care plan. All questions answered. Return precautions discussed and outpatient follow up given.   Discharge Medication List as of 02/02/2014  1:13 AM    START  taking these medications   Details  azithromycin (ZITHROMAX Z-PAK) 250 MG tablet 2 po day one, then 1 daily x 4 days, Print    HYDROcodone-acetaminophen (HYCET) 7.5-325 mg/15 ml solution Take 15 mLs by mouth every 6 (six) hours as needed. Maximum 90 mL per day. Do not combine with any other acetaminophen-containing solutions or tabs, Starting 02/02/2014, Until Discontinued, Print        Note: Portions of this report may have been transcribed using voice recognition software. Every effort was made to ensure accuracy; however, inadvertent computerized transcription errors may be present     Monico Blitz, PA-C 02/02/14 [id number]

## 2014-02-02 NOTE — ED Provider Notes (Signed)
Medical screening examination/treatment/procedure(s) were conducted as a shared visit with non-physician practitioner(s) and myself.  I personally evaluated the patient during the encounter.   EKG Interpretation None        Elyn Peers, MD 02/02/14 [phone]

## 2014-03-05 ENCOUNTER — Ambulatory Visit
Admission: RE | Admit: 2014-03-05 | Discharge: 2014-03-05 | Disposition: A | Discharge status: Home / Self Care | Payer: Managed Care, Other (non HMO) | Source: Ambulatory Visit | Attending: Nephrology | Admitting: Nephrology

## 2014-03-05 ENCOUNTER — Other Ambulatory Visit: Payer: Self-pay | Admitting: Nephrology

## 2014-03-05 DIAGNOSIS — R509 Fever, unspecified: Secondary | ICD-10-CM

## 2014-04-04 NOTE — ED Provider Notes (Signed)
Medical screening examination/treatment/procedure(s) were conducted as a shared visit with non-physician practitioner(s) and myself.  I personally evaluated the patient during the encounter.  Insulin dependent diabetic woman who is immunocompromised by virtue of treatment with immunosuppressant medication s/p kidney transplant. Here with 2d of minimally productive cough. No fever. No chest pain. VS are notable for pulse at high range of normal. BS are clear. CXR is wnl. Suspect bronchitis. Stable for discharge with plan for symptomatic management.    Elyn Peers, MD 04/04/14 [phone]

## 2014-04-18 ENCOUNTER — Emergency Department (HOSPITAL_BASED_OUTPATIENT_CLINIC_OR_DEPARTMENT_OTHER)
Admission: EM | Admit: 2014-04-18 | Discharge: 2014-04-18 | Disposition: A | Discharge status: Home / Self Care | Payer: Managed Care, Other (non HMO) | Attending: Emergency Medicine | Admitting: Emergency Medicine

## 2014-04-18 ENCOUNTER — Encounter (HOSPITAL_BASED_OUTPATIENT_CLINIC_OR_DEPARTMENT_OTHER): Payer: Self-pay | Admitting: Emergency Medicine

## 2014-04-18 DIAGNOSIS — H1089 Other conjunctivitis: Secondary | ICD-10-CM | POA: Insufficient documentation

## 2014-04-18 DIAGNOSIS — IMO0002 Reserved for concepts with insufficient information to code with codable children: Secondary | ICD-10-CM | POA: Insufficient documentation

## 2014-04-18 DIAGNOSIS — Z792 Long term (current) use of antibiotics: Secondary | ICD-10-CM | POA: Insufficient documentation

## 2014-04-18 DIAGNOSIS — Z940 Kidney transplant status: Secondary | ICD-10-CM | POA: Insufficient documentation

## 2014-04-18 DIAGNOSIS — Z79899 Other long term (current) drug therapy: Secondary | ICD-10-CM | POA: Insufficient documentation

## 2014-04-18 DIAGNOSIS — Z794 Long term (current) use of insulin: Secondary | ICD-10-CM | POA: Insufficient documentation

## 2014-04-18 DIAGNOSIS — E119 Type 2 diabetes mellitus without complications: Secondary | ICD-10-CM | POA: Insufficient documentation

## 2014-04-18 DIAGNOSIS — H109 Unspecified conjunctivitis: Secondary | ICD-10-CM

## 2014-04-18 DIAGNOSIS — Z8742 Personal history of other diseases of the female genital tract: Secondary | ICD-10-CM | POA: Insufficient documentation

## 2014-04-18 MED ORDER — TETRACAINE HCL 0.5 % OP SOLN
2.0000 [drp] | Freq: Once | OPHTHALMIC | Status: AC
  Administered 2014-04-18: 2 [drp] via OPHTHALMIC
  Filled 2014-04-18: qty 2

## 2014-04-18 MED ORDER — CIPROFLOXACIN HCL 0.3 % OP SOLN: 1.0000 [drp] | OPHTHALMIC | Status: DC

## 2014-04-18 MED ORDER — FLUORESCEIN SODIUM 1 MG OP STRP
1.0000 | ORAL_STRIP | Freq: Once | OPHTHALMIC | Status: AC
  Administered 2014-04-18: 1 via OPHTHALMIC
  Filled 2014-04-18: qty 1

## 2014-04-18 NOTE — ED Notes (Signed)
Pt reports (L) eye irritation, drainage and redness x 2 days.  Denies injury

## 2014-04-18 NOTE — Discharge Instructions (Signed)

## 2014-04-18 NOTE — ED Provider Notes (Signed)
CSN: [id number]     Arrival date & time 04/18/14  [id number] History   First MD Initiated Contact with Patient 04/18/14 [phone]     Chief Complaint  Patient presents with  . Eye Problem     (Consider location/radiation/quality/duration/timing/severity/associated sxs/prior Treatment) Patient is a 38 y.o. female presenting with eye pain.  Eye Pain This is a new problem. Episode onset: 2 days ago. The problem occurs constantly. The problem has been gradually worsening. Pertinent negatives include no congestion, coughing, fever, headaches, nausea or vomiting. Exacerbated by: moving eyes. Treatments tried: OTC eye drops and moxifloxacin eye drops. The treatment provided no relief.  No sick contacts  Past Medical History  Diagnosis Date  . Diabetes mellitus   . S/P kidney transplant   . Renal failure   . Pyelonephritis 09/30/11   Past Surgical History  Procedure Laterality Date  . Cesarean section  1996  . Tubal ligation  2000  . Kidney transplant  03/2010    right  . Peritoneal catheter insertion  ~ 2003  . Loop av graft  05/2009    left upper arm   History reviewed. No pertinent family history. History  Substance Use Topics  . Smoking status: Never Smoker   . Smokeless tobacco: Never Used  . Alcohol Use: No   OB History   Grav Para Term Preterm Abortions TAB SAB Ect Mult Living                 Review of Systems  Constitutional: Negative for fever.  HENT: Negative for congestion, sinus pressure and sneezing.   Eyes: Positive for photophobia, pain, discharge, redness, itching and visual disturbance (slightly worsened ).  Respiratory: Negative for cough.   Gastrointestinal: Negative for nausea and vomiting.  Neurological: Negative for headaches.  All other systems reviewed and are negative.     Allergies  Review of patient's allergies indicates no known allergies.  Home Medications   Prior to Admission medications   Medication Sig Start Date End Date Taking? Authorizing  Provider  amLODipine (NORVASC) 5 MG tablet Take 5 mg by mouth daily.      Historical Provider, MD  azithromycin (ZITHROMAX Z-PAK) 250 MG tablet 2 po day one, then 1 daily x 4 days 02/02/14   Elmyra Ricks Pisciotta, PA-C  ciprofloxacin (CILOXAN) 0.3 % ophthalmic solution Place 1-2 drops into both eyes every 2 (two) hours while awake. Administer 1 drop, every 2 hours, while awake, for 2 days. Then 1 drop, every 4 hours, while awake, for the next 5 days. 04/18/14   Cordelia Poche, MD  HYDROcodone-acetaminophen (HYCET) 7.5-325 mg/15 ml solution Take 15 mLs by mouth every 6 (six) hours as needed. Maximum 90 mL per day. Do not combine with any other acetaminophen-containing solutions or tabs 02/02/14   Elmyra Ricks Pisciotta, PA-C  insulin aspart protamine- aspart (NOVOLOG 70/30) (70-30) 100 UNIT/ML injection Inject into the skin 2 (two) times daily. Take novolog 70/30 per sliding scale instructions twice daily.    Historical Provider, MD  insulin glargine (LANTUS) 100 UNIT/ML injection Inject 30 Units into the skin at bedtime.      Historical Provider, MD  insulin NPH (HUMULIN N,NOVOLIN N) 100 UNIT/ML injection Inject 1-15 Units into the skin 3 (three) times daily before meals. Per sliding scale     Historical Provider, MD  mycophenolate (CELLCEPT) 250 MG capsule Take 500 mg by mouth every morning.      Historical Provider, MD  mycophenolate (CELLCEPT) 250 MG capsule Take 750 mg by mouth every  evening.      Historical Provider, MD  omeprazole (PRILOSEC) 20 MG capsule Take 20 mg by mouth daily.      Historical Provider, MD  predniSONE (DELTASONE) 20 MG tablet Take 3 tablets by mouth daily for 4 days, then 2 tablets by mouth daily for next 3 days, then 1 tablet by mouth daily for 3 days 03/06/13   Saddie Benders. Ghim, MD  predniSONE (DELTASONE) 5 MG tablet Take 5 mg by mouth daily.      Historical Provider, MD  tacrolimus (PROGRAF) 1 MG capsule Take 3 mg by mouth 2 (two) times daily.      Historical Provider, MD   BP 150/77  Pulse  78  Temp(Src) 97.9 F (36.6 C) (Oral)  Resp 20  Ht 5\' 5"  (1.651 m)  Wt 215 lb (97.523 kg)  BMI 35.78 kg/m2  SpO2 99%  Physical Exam  Constitutional: She is oriented to person, place, and time. She appears well-developed and well-nourished.  HENT:  Head: Normocephalic.  Right Ear: Tympanic membrane normal.  Left Ear: Tympanic membrane normal.  Mouth/Throat: Oropharynx is clear and moist.  Eyes: EOM are normal. Pupils are equal, round, and reactive to light. Lids are everted and swept, no foreign bodies found. Right conjunctiva is not injected. Right conjunctiva has no hemorrhage. Left conjunctiva is injected. Left conjunctiva has no hemorrhage. No scleral icterus.  Visual fields intact. No corneal clouding. No pain on movement  Cardiovascular: Normal rate, regular rhythm and normal heart sounds.   No murmur heard. Pulmonary/Chest: Effort normal and breath sounds normal.  Neurological: She is alert and oriented to person, place, and time.  Skin: Skin is warm and dry.    ED Course  Procedures (including critical care time) Labs Review Labs Reviewed - No data to display  Imaging Review No results found.   EKG Interpretation None     Visual acuity Bilateral: 20/25 Left: 20/30 Right: 20/30  Intraocular pressure: Average of 20  MDM   Final diagnoses:  Conjunctivitis of left eye   Patient has a history renal transplant on immunosuppressant and diabetes. Presentation most consistent with viral conjunctivitis, however, with medical history, do not want to miss possible bacterial conjunctivitis. Low suspicion for orbital cellulitis and glaucoma. Discussed option for CT scan to evaluate for orbital cellulitis, but patient agreed to try antibiotics for a few days since this is episode is similar to a previous episode, and will return if symptoms worsen.    Cordelia Poche, MD 04/18/14 [phone]

## 2014-04-19 NOTE — ED Provider Notes (Signed)
Medical screening examination/treatment/procedure(s) were conducted as a shared visit with non-physician practitioner(s) or resident and myself. I personally evaluated the patient during the encounter and agree with the findings.  I have personally reviewed any xrays and/ or EKG's with the provider and I agree with interpretation.  The patient with kidney transplant history on immune suppressants, diabetic control presents with left eye ear dictation, clear drainage and redness for 2 days. Patient said similar in the past and so taking her old antibiotic eyedrops 2 doses. Mild irritation/eighth discomfort not specifically worse with movement. No significant swelling on the eye, no fevers or systemic symptoms. Patient feels well otherwise. Patient is not work contact lenses and has not had surgery recently on her eye. On exam patient has conjunctival injection, no foreign body visualized, pupils equal bilateral, extraocular muscle function intact, no pain with eye movement, no significant eye swelling or erythema. Visual fields intact to fingers with no double vision or blurry vision during exam. No haziness or anterior chamber concerns at this time. No glaucoma history. I discussed in detail the differential diagnosis with patient and likely this is viral/less likely bacterial conjunctivitis. Patient has no systemic diseases to consider scleritis at this time. Patient does recognize she is high-risk due to her immunosuppressants and we discussed CT scan of the orbit today versus giving it 2 days of antibiotic drops/compresses and followup closely with ophthalmology or ER if no improvement or worsening. Patient prefers to hold and CT scan today as this is similar to previous and she will return for those reasons. Plan to check pressure and stain the eye for corneal abrasion for completeness. Visual acuity being done by nursing. IOP average 20. No abrasion seen. Fup ophth discussed.  Conjunctivitis left  eye   Mariea Clonts, MD 04/19/14 [phone]

## 2014-06-17 ENCOUNTER — Encounter (HOSPITAL_COMMUNITY): Payer: Self-pay | Admitting: Emergency Medicine

## 2014-06-17 DIAGNOSIS — E119 Type 2 diabetes mellitus without complications: Secondary | ICD-10-CM | POA: Diagnosis not present

## 2014-06-17 DIAGNOSIS — Z79899 Other long term (current) drug therapy: Secondary | ICD-10-CM | POA: Insufficient documentation

## 2014-06-17 DIAGNOSIS — T4995XA Adverse effect of unspecified topical agent, initial encounter: Secondary | ICD-10-CM | POA: Diagnosis not present

## 2014-06-17 DIAGNOSIS — R21 Rash and other nonspecific skin eruption: Secondary | ICD-10-CM | POA: Insufficient documentation

## 2014-06-17 DIAGNOSIS — Z940 Kidney transplant status: Secondary | ICD-10-CM | POA: Diagnosis not present

## 2014-06-17 DIAGNOSIS — Z794 Long term (current) use of insulin: Secondary | ICD-10-CM | POA: Diagnosis not present

## 2014-06-17 DIAGNOSIS — Z87448 Personal history of other diseases of urinary system: Secondary | ICD-10-CM | POA: Diagnosis not present

## 2014-06-17 DIAGNOSIS — L299 Pruritus, unspecified: Secondary | ICD-10-CM | POA: Insufficient documentation

## 2014-06-17 MED ORDER — DIPHENHYDRAMINE HCL 25 MG PO CAPS
50.0000 mg | ORAL_CAPSULE | Freq: Once | ORAL | Status: AC
  Administered 2014-06-18: 50 mg via ORAL
  Filled 2014-06-17: qty 2

## 2014-06-17 MED ORDER — FAMOTIDINE 20 MG PO TABS
20.0000 mg | ORAL_TABLET | Freq: Once | ORAL | Status: AC
  Administered 2014-06-18: 20 mg via ORAL
  Filled 2014-06-17: qty 1

## 2014-06-17 MED ORDER — FAMOTIDINE IN NACL 20-0.9 MG/50ML-% IV SOLN: 20.0000 mg | Freq: Once | INTRAVENOUS | Status: DC

## 2014-06-17 NOTE — ED Notes (Signed)
The pt has been itchihng around her neck since Saturday since then it has progressed to her entire body r with redness swelling and her throat itches.  No difficulty breathing.  She had benadryl yesterday..  Pt discussed with dr Eulis Foster.  Will give meds

## 2014-06-18 ENCOUNTER — Emergency Department (HOSPITAL_COMMUNITY)
Admission: EM | Admit: 2014-06-18 | Discharge: 2014-06-18 | Disposition: A | Discharge status: Home / Self Care | Payer: Managed Care, Other (non HMO) | Attending: Emergency Medicine | Admitting: Emergency Medicine

## 2014-06-18 DIAGNOSIS — T7840XA Allergy, unspecified, initial encounter: Secondary | ICD-10-CM

## 2014-06-18 MED ORDER — PREDNISONE 20 MG PO TABS
60.0000 mg | ORAL_TABLET | Freq: Once | ORAL | Status: AC
  Administered 2014-06-18: 60 mg via ORAL
  Filled 2014-06-18: qty 3

## 2014-06-18 MED ORDER — PREDNISONE 20 MG PO TABS: 60.0000 mg | ORAL_TABLET | Freq: Every day | ORAL | Status: DC

## 2014-06-18 MED ORDER — EPINEPHRINE 0.3 MG/0.3ML IJ SOAJ: 0.3000 mg | INTRAMUSCULAR | Status: DC | PRN

## 2014-06-18 MED ORDER — EPINEPHRINE 0.3 MG/0.3ML IJ SOAJ
0.3000 mg | Freq: Once | INTRAMUSCULAR | Status: AC
  Administered 2014-06-18: 0.3 mg via INTRAMUSCULAR
  Filled 2014-06-18: qty 0.3

## 2014-06-18 NOTE — Discharge Instructions (Signed)
Contact Dermatitis You were seen for a rash.  Your symptoms improved with allergic medication.  Continue to take prednisone for 4 days for continued care.  Keep an epi-pen on you at all times.  Follow up with a primary care doctor within 3 days for repeat evaluation.  Return to the ED for any worsening.  Thank you. Contact dermatitis is a rash that happens when something touches the skin. You touched something that irritates your skin, or you have allergies to something you touched. HOME CARE   Avoid the thing that caused your rash.  Keep your rash away from hot water, soap, sunlight, chemicals, and other things that might bother it.  Do not scratch your rash.  You can take cool baths to help stop itching.  Only take medicine as told by your doctor.  Keep all doctor visits as told. GET HELP RIGHT AWAY IF:   Your rash is not better after 3 days.  Your rash gets worse.  Your rash is puffy (swollen), tender, red, sore, or warm.  You have problems with your medicine. MAKE SURE YOU:   Understand these instructions.  Will watch your condition.  Will get help right away if you are not doing well or get worse. Document Released: 07/18/2009 Document Revised: 12/13/2011 Document Reviewed: 02/23/2011 [facility] Patient Information 2015 [place], [place]. This information is not intended to replace advice given to you by your health care provider. Make sure you discuss any questions you have with your health care provider.

## 2014-06-18 NOTE — ED Provider Notes (Signed)
CSN: [id number]     Arrival date & time 06/17/14  2339 History   First MD Initiated Contact with Patient 06/18/14 0123     Chief Complaint  Patient presents with  . Allergic Reaction     (Consider location/radiation/quality/duration/timing/severity/associated sxs/prior Treatment) HPI [name] is a 38 y.o. female with past medical history of diabetes and kidney transplant coming in with redness on her neck for the past 2 days patient states this began when she went to [place] to visit her daughter. The area is pruritic. She believes she is having allergic reaction and she developed a rash on her arms with itching as well. Today her entire body itches. She took Benadryl with mild relief. She states she went to sleep and woke up only slightly better. There is no swelling. She cannot identify any cause or any new products that may have caused her allergic reaction. No fevers or recent infections. No swelling in her lips tongue or mouth. She's been able to handle her saliva and there is no shortness of breath.  10 Systems reviewed and are negative for acute change except as noted in the HPI.     Past Medical History  Diagnosis Date  . Diabetes mellitus   . S/P kidney transplant   . Renal failure   . Pyelonephritis 09/30/11   Past Surgical History  Procedure Laterality Date  . Cesarean section  1996  . Tubal ligation  2000  . Kidney transplant  03/2010    right  . Peritoneal catheter insertion  ~ 2003  . Loop av graft  05/2009    left upper arm   No family history on file. History  Substance Use Topics  . Smoking status: Never Smoker   . Smokeless tobacco: Never Used  . Alcohol Use: No   OB History   Grav Para Term Preterm Abortions TAB SAB Ect Mult Living                 Review of Systems    Allergies  Review of patient's allergies indicates no known allergies.  Home Medications   Prior to Admission medications   Medication Sig Start Date End  Date Taking? Authorizing Provider  albuterol (PROVENTIL HFA;VENTOLIN HFA) 108 (90 BASE) MCG/ACT inhaler Inhale 2 puffs into the lungs every 6 (six) hours as needed for wheezing or shortness of breath.   Yes Historical Provider, MD  amLODipine (NORVASC) 5 MG tablet Take 5 mg by mouth daily.     Yes Historical Provider, MD  calcitRIOL (ROCALTROL) 0.25 MCG capsule Take 0.25 mcg by mouth 3 (three) times a week.   Yes Historical Provider, MD  insulin glargine (LANTUS) 100 UNIT/ML injection Inject 70 Units into the skin at bedtime.    Yes Historical Provider, MD  mycophenolate (CELLCEPT) 250 MG capsule Take 500 mg by mouth every morning.     Yes Historical Provider, MD  mycophenolate (CELLCEPT) 250 MG capsule Take 750 mg by mouth every evening.     Yes Historical Provider, MD  omeprazole (PRILOSEC) 20 MG capsule Take 20 mg by mouth daily.     Yes Historical Provider, MD  predniSONE (DELTASONE) 5 MG tablet Take 5 mg by mouth daily.     Yes Historical Provider, MD  repaglinide (PRANDIN) 1 MG tablet Take 1 mg by mouth 2 (two) times daily before a meal.   Yes Historical Provider, MD  tacrolimus (PROGRAF) 1 MG capsule Take 3 mg by mouth 2 (two) times daily.  Yes Historical Provider, MD   BP 141/69  Pulse 79  Temp(Src) 98.3 F (36.8 C) (Oral)  Resp 17  Ht 5\' 5"  (1.651 m)  Wt 220 lb (99.791 kg)  BMI 36.61 kg/m2  SpO2 97% Physical Exam  Nursing note and vitals reviewed. Constitutional: She is oriented to person, place, and time. She appears well-developed and well-nourished. No distress.  HENT:  Head: Normocephalic and atraumatic.  Nose: Nose normal.  Mouth/Throat: Oropharynx is clear and moist. No oropharyngeal exudate.  No uvular swelling or deviation.  Eyes: Conjunctivae and EOM are normal. Pupils are equal, round, and reactive to light. No scleral icterus.  Neck: Normal range of motion. Neck supple. No JVD present. No tracheal deviation present. No thyromegaly present.  Erythematous rash seen  in the distribution of a necklace. There is no warmth, or drainage, or tenderness to palpation.  Cardiovascular: Normal rate, regular rhythm and normal heart sounds.  Exam reveals no gallop and no friction rub.   No murmur heard. Pulmonary/Chest: Effort normal and breath sounds normal. No respiratory distress. She has no wheezes. She exhibits no tenderness.  Abdominal: Soft. Bowel sounds are normal. She exhibits no distension and no mass. There is no tenderness. There is no rebound and no guarding.  Musculoskeletal: Normal range of motion. She exhibits no edema and no tenderness.  Bilateral upper extremities with small papules and now erythema. No warmth drainage or pain to palpation.  Lymphadenopathy:    She has no cervical adenopathy.  Neurological: She is alert and oriented to person, place, and time. No cranial nerve deficit. She exhibits normal muscle tone.  Skin: Skin is warm and dry. No rash noted. She is not diaphoretic. No erythema. No pallor.    ED Course  Procedures (including critical care time) Labs Review Labs Reviewed - No data to display  Imaging Review No results found.   EKG Interpretation None      MDM   Final diagnoses:  None    Recent presents emergency department out of concern for a rash. The area distribution appears to be caused by a necklace. Patient denies wearing any necklaces. She cannot think of other cause for her contact dermatitis or allergic reaction. She was given Benadryl and famotidine in triage. Additionally she was given epi and prednisone. Upon my repeat assessment patient states that the pruritus in her arms and resolved and her neck is improved. The rash on her neck also appears to have gone down. Patient will be discharged with EpiPen prescription as well as prednisone for the next 4 days. She is advised to call the primary care physician within 3 days for continued care. Patient's vital signs remain within normal limits she is safe for  discharge.    Everlene Balls, MD 06/18/14 [phone]

## 2014-06-18 NOTE — ED Notes (Signed)
Pt states no changes on her itchiness on her neck, but some relief on the rest of her body.

## 2015-01-07 ENCOUNTER — Encounter: Payer: Self-pay | Admitting: Podiatry

## 2015-01-07 ENCOUNTER — Ambulatory Visit (INDEPENDENT_AMBULATORY_CARE_PROVIDER_SITE_OTHER): Payer: 59 | Admitting: Podiatry

## 2015-01-07 ENCOUNTER — Ambulatory Visit (INDEPENDENT_AMBULATORY_CARE_PROVIDER_SITE_OTHER): Payer: 59

## 2015-01-07 VITALS — BP 137/72 | HR 78 | Resp 16

## 2015-01-07 DIAGNOSIS — M79676 Pain in unspecified toe(s): Secondary | ICD-10-CM

## 2015-01-07 DIAGNOSIS — E1142 Type 2 diabetes mellitus with diabetic polyneuropathy: Secondary | ICD-10-CM | POA: Diagnosis not present

## 2015-01-07 MED ORDER — GABAPENTIN 100 MG PO CAPS: 100.0000 mg | ORAL_CAPSULE | Freq: Every day | ORAL | Status: DC

## 2015-01-07 NOTE — Progress Notes (Signed)
She presents today for chief complaint of burning and tingling in her toes with sharp shooting pains at night. She states that this is been going on for quite some time now seems to be getting worse.  Objective: Vital signs are stable she is alert and oriented 3. I have reviewed her past medical history medications allergy surgery social history and review of systems. She has no reproducible pain on palpation of the bilateral foot. Pulses are strongly palpable bilateral. Decreased sensorium per Semmes-Weinstein monofilament. Mild flexible hammertoe deformities bilateral.  Assessment: Diabetic peripheral neuropathy bilateral.  Plan: Started her on 100 mg of gabapentin daily at bedtime. Follow up with her in 6 weeks.

## 2015-03-04 ENCOUNTER — Ambulatory Visit: Payer: 59 | Admitting: Podiatry

## 2015-03-17 ENCOUNTER — Telehealth: Payer: Self-pay | Admitting: *Deleted

## 2015-03-17 MED ORDER — GABAPENTIN 100 MG PO CAPS: 100.0000 mg | ORAL_CAPSULE | Freq: Every day | ORAL | Status: DC

## 2015-03-17 NOTE — Telephone Encounter (Signed)
Request from [facility] [address] [place], [place] [address] sent requesting Gabapentin 100mg  #30 one at hs, be written as 90 day rx.  Dr. Milinda Pointer ordered refill as #90 plus 1 refill. Done.

## 2016-05-07 DIAGNOSIS — R1312 Dysphagia, oropharyngeal phase: Secondary | ICD-10-CM | POA: Insufficient documentation

## 2016-05-07 DIAGNOSIS — R12 Heartburn: Secondary | ICD-10-CM | POA: Insufficient documentation

## 2016-10-19 DIAGNOSIS — Z940 Kidney transplant status: Secondary | ICD-10-CM | POA: Insufficient documentation

## 2016-12-07 DIAGNOSIS — N2581 Secondary hyperparathyroidism of renal origin: Secondary | ICD-10-CM | POA: Insufficient documentation

## 2017-07-24 DIAGNOSIS — D899 Disorder involving the immune mechanism, unspecified: Secondary | ICD-10-CM

## 2017-07-24 DIAGNOSIS — D849 Immunodeficiency, unspecified: Secondary | ICD-10-CM | POA: Insufficient documentation

## 2018-08-02 DIAGNOSIS — G5621 Lesion of ulnar nerve, right upper limb: Secondary | ICD-10-CM | POA: Insufficient documentation

## 2018-09-13 ENCOUNTER — Other Ambulatory Visit: Payer: Self-pay | Admitting: Orthopedic Surgery

## 2018-09-13 DIAGNOSIS — G5603 Carpal tunnel syndrome, bilateral upper limbs: Secondary | ICD-10-CM | POA: Insufficient documentation

## 2018-09-13 DIAGNOSIS — G5622 Lesion of ulnar nerve, left upper limb: Secondary | ICD-10-CM | POA: Insufficient documentation

## 2018-10-05 ENCOUNTER — Ambulatory Visit: Payer: 59 | Admitting: Internal Medicine

## 2018-10-10 ENCOUNTER — Encounter: Payer: Self-pay | Admitting: Internal Medicine

## 2018-10-10 ENCOUNTER — Ambulatory Visit (INDEPENDENT_AMBULATORY_CARE_PROVIDER_SITE_OTHER): Payer: 59 | Admitting: Internal Medicine

## 2018-10-10 VITALS — BP 124/82 | HR 83 | Temp 98.3°F | Ht 64.0 in | Wt 227.2 lb

## 2018-10-10 DIAGNOSIS — N182 Chronic kidney disease, stage 2 (mild): Secondary | ICD-10-CM

## 2018-10-10 DIAGNOSIS — Z940 Kidney transplant status: Secondary | ICD-10-CM

## 2018-10-10 DIAGNOSIS — I129 Hypertensive chronic kidney disease with stage 1 through stage 4 chronic kidney disease, or unspecified chronic kidney disease: Secondary | ICD-10-CM | POA: Diagnosis not present

## 2018-10-10 DIAGNOSIS — E1122 Type 2 diabetes mellitus with diabetic chronic kidney disease: Secondary | ICD-10-CM | POA: Diagnosis not present

## 2018-10-10 DIAGNOSIS — Z794 Long term (current) use of insulin: Secondary | ICD-10-CM

## 2018-10-10 DIAGNOSIS — Z6839 Body mass index (BMI) 39.0-39.9, adult: Secondary | ICD-10-CM

## 2018-10-10 LAB — POCT URINALYSIS DIPSTICK
BILIRUBIN UA: NEGATIVE
Blood, UA: NEGATIVE
Glucose, UA: NEGATIVE
Ketones, UA: NEGATIVE
Leukocytes, UA: NEGATIVE
Nitrite, UA: NEGATIVE
Protein, UA: NEGATIVE
Spec Grav, UA: 1.01 (ref 1.010–1.025)
Urobilinogen, UA: 0.2 E.U./dL
pH, UA: 5.5 (ref 5.0–8.0)

## 2018-10-10 LAB — POCT UA - MICROALBUMIN
CREATININE, POC: 50 mg/dL
Microalbumin Ur, POC: 10 mg/L

## 2018-10-10 MED ORDER — INSULIN DEGLUDEC 200 UNIT/ML ~~LOC~~ SOPN: 60.0000 [IU] | PEN_INJECTOR | Freq: Every day | SUBCUTANEOUS | 2 refills | Status: DC

## 2018-10-10 NOTE — Patient Instructions (Signed)
Diabetes Mellitus and Exercise Exercising regularly is important for your overall health, especially when you have diabetes (diabetes mellitus). Exercising is not only about losing weight. It has many other health benefits, such as increasing muscle strength and bone density and reducing body fat and stress. This leads to improved fitness, flexibility, and endurance, all of which result in better overall health. Exercise has additional benefits for people with diabetes, including:  Reducing appetite.  Helping to lower and control blood glucose.  Lowering blood pressure.  Helping to control amounts of fatty substances (lipids) in the blood, such as cholesterol and triglycerides.  Helping the body to respond better to insulin (improving insulin sensitivity).  Reducing how much insulin the body needs.  Decreasing the risk for heart disease by: ? Lowering cholesterol and triglyceride levels. ? Increasing the levels of good cholesterol. ? Lowering blood glucose levels. What is my activity plan? Your health care provider or certified diabetes educator can help you make a plan for the type and frequency of exercise (activity plan) that works for you. Make sure that you:  Do at least 150 minutes of moderate-intensity or vigorous-intensity exercise each week. This could be brisk walking, biking, or water aerobics. ? Do stretching and strength exercises, such as yoga or weightlifting, at least 2 times a week. ? Spread out your activity over at least 3 days of the week.  Get some form of physical activity every day. ? Do not go more than 2 days in a row without some kind of physical activity. ? Avoid being inactive for more than 30 minutes at a time. Take frequent breaks to walk or stretch.  Choose a type of exercise or activity that you enjoy, and set realistic goals.  Start slowly, and gradually increase the intensity of your exercise over time. What do I need to know about managing my  diabetes?   Check your blood glucose before and after exercising. ? If your blood glucose is 240 mg/dL (13.3 mmol/L) or higher before you exercise, check your urine for ketones. If you have ketones in your urine, do not exercise until your blood glucose returns to normal. ? If your blood glucose is 100 mg/dL (5.6 mmol/L) or lower, eat a snack containing 15-20 grams of carbohydrate. Check your blood glucose 15 minutes after the snack to make sure that your level is above 100 mg/dL (5.6 mmol/L) before you start your exercise.  Know the symptoms of low blood glucose (hypoglycemia) and how to treat it. Your risk for hypoglycemia increases during and after exercise. Common symptoms of hypoglycemia can include: ? Hunger. ? Anxiety. ? Sweating and feeling clammy. ? Confusion. ? Dizziness or feeling light-headed. ? Increased heart rate or palpitations. ? Blurry vision. ? Tingling or numbness around the mouth, lips, or tongue. ? Tremors or shakes. ? Irritability.  Keep a rapid-acting carbohydrate snack available before, during, and after exercise to help prevent or treat hypoglycemia.  Avoid injecting insulin into areas of the body that are going to be exercised. For example, avoid injecting insulin into: ? The arms, when playing tennis. ? The legs, when jogging.  Keep records of your exercise habits. Doing this can help you and your health care provider adjust your diabetes management plan as needed. Write down: ? Food that you eat before and after you exercise. ? Blood glucose levels before and after you exercise. ? The type and amount of exercise you have done. ? When your insulin is expected to peak, if you use   insulin. Avoid exercising at times when your insulin is peaking.  When you start a new exercise or activity, work with your health care provider to make sure the activity is safe for you, and to adjust your insulin, medicines, or food intake as needed.  Drink plenty of water while  you exercise to prevent dehydration or heat stroke. Drink enough fluid to keep your urine clear or pale yellow. Summary  Exercising regularly is important for your overall health, especially when you have diabetes (diabetes mellitus).  Exercising has many health benefits, such as increasing muscle strength and bone density and reducing body fat and stress.  Your health care provider or certified diabetes educator can help you make a plan for the type and frequency of exercise (activity plan) that works for you.  When you start a new exercise or activity, work with your health care provider to make sure the activity is safe for you, and to adjust your insulin, medicines, or food intake as needed. This information is not intended to replace advice given to you by your health care provider. Make sure you discuss any questions you have with your health care provider. Document Released: 12/11/2003 Document Revised: 03/31/2017 Document Reviewed: 03/01/2016 Elsevier Interactive Patient Education  2019 [employer]  

## 2018-10-12 LAB — CMP14+EGFR
ALT: 17 IU/L (ref 0–32)
AST: 19 IU/L (ref 0–40)
Albumin/Globulin Ratio: 1.6 (ref 1.2–2.2)
Albumin: 4.2 g/dL (ref 3.5–5.5)
Alkaline Phosphatase: 99 IU/L (ref 39–117)
BUN/Creatinine Ratio: 21 (ref 9–23)
BUN: 25 mg/dL — ABNORMAL HIGH (ref 6–24)
Bilirubin Total: 0.4 mg/dL (ref 0.0–1.2)
CALCIUM: 9.7 mg/dL (ref 8.7–10.2)
CO2: 23 mmol/L (ref 20–29)
CREATININE: 1.17 mg/dL — AB (ref 0.57–1.00)
Chloride: 100 mmol/L (ref 96–106)
GFR calc Af Amer: 66 mL/min/{1.73_m2} (ref >59)
GFR, EST NON AFRICAN AMERICAN: 57 mL/min/{1.73_m2} — AB (ref >59)
GLOBULIN, TOTAL: 2.6 g/dL (ref 1.5–4.5)
Glucose: 100 mg/dL — ABNORMAL HIGH (ref 65–99)
Potassium: 4.4 mmol/L (ref 3.5–5.2)
SODIUM: 140 mmol/L (ref 134–144)
Total Protein: 6.8 g/dL (ref 6.0–8.5)

## 2018-10-12 LAB — TSH: TSH: 2.24 u[IU]/mL (ref 0.450–4.500)

## 2018-10-12 LAB — HEMOGLOBIN A1C
Est. average glucose Bld gHb Est-mCnc: 243 mg/dL
Hgb A1c MFr Bld: 10.1 % — ABNORMAL HIGH (ref 4.8–5.6)

## 2018-10-12 LAB — C-PEPTIDE: C-Peptide: 0.5 ng/mL — ABNORMAL LOW (ref 1.1–4.4)

## 2018-10-12 NOTE — Progress Notes (Signed)
Here are your lab results:  Your kidney function is in stage 2 range. Be sure to stay well hydrated. Your liver function is normal. Your hba1c is 10.1, this is quite high! I hope that we are able to get your sugars to goal with use of Ozempic. How do you feel so far on the Ozempic?   It is also important that you incorporate more exercise into your daily routine. Your thyroid function is normal.   Please let me know if you have any questions.    Sincerely,    Shante Maysonet N. Baird Cancer, MD

## 2018-10-13 ENCOUNTER — Encounter (HOSPITAL_BASED_OUTPATIENT_CLINIC_OR_DEPARTMENT_OTHER): Payer: Self-pay

## 2018-10-13 DIAGNOSIS — N186 End stage renal disease: Secondary | ICD-10-CM | POA: Diagnosis not present

## 2018-10-13 DIAGNOSIS — E119 Type 2 diabetes mellitus without complications: Secondary | ICD-10-CM | POA: Diagnosis not present

## 2018-10-13 DIAGNOSIS — X58XXXA Exposure to other specified factors, initial encounter: Secondary | ICD-10-CM | POA: Diagnosis not present

## 2018-10-13 DIAGNOSIS — Y929 Unspecified place or not applicable: Secondary | ICD-10-CM | POA: Insufficient documentation

## 2018-10-13 DIAGNOSIS — Y939 Activity, unspecified: Secondary | ICD-10-CM | POA: Insufficient documentation

## 2018-10-13 DIAGNOSIS — S92351A Displaced fracture of fifth metatarsal bone, right foot, initial encounter for closed fracture: Secondary | ICD-10-CM | POA: Diagnosis not present

## 2018-10-13 DIAGNOSIS — Z794 Long term (current) use of insulin: Secondary | ICD-10-CM | POA: Diagnosis not present

## 2018-10-13 DIAGNOSIS — Z940 Kidney transplant status: Secondary | ICD-10-CM | POA: Insufficient documentation

## 2018-10-13 DIAGNOSIS — Z79899 Other long term (current) drug therapy: Secondary | ICD-10-CM | POA: Diagnosis not present

## 2018-10-13 DIAGNOSIS — I120 Hypertensive chronic kidney disease with stage 5 chronic kidney disease or end stage renal disease: Secondary | ICD-10-CM | POA: Diagnosis not present

## 2018-10-13 DIAGNOSIS — Y999 Unspecified external cause status: Secondary | ICD-10-CM | POA: Diagnosis not present

## 2018-10-13 DIAGNOSIS — S99921A Unspecified injury of right foot, initial encounter: Secondary | ICD-10-CM | POA: Diagnosis present

## 2018-10-13 NOTE — ED Triage Notes (Signed)
Pt c/o right foot pain x 3 days after exercising.

## 2018-10-14 ENCOUNTER — Emergency Department (HOSPITAL_BASED_OUTPATIENT_CLINIC_OR_DEPARTMENT_OTHER)
Admission: EM | Admit: 2018-10-14 | Discharge: 2018-10-14 | Disposition: A | Discharge status: Home / Self Care | Payer: 59 | Attending: Emergency Medicine | Admitting: Emergency Medicine

## 2018-10-14 ENCOUNTER — Other Ambulatory Visit: Payer: Self-pay

## 2018-10-14 ENCOUNTER — Emergency Department (HOSPITAL_BASED_OUTPATIENT_CLINIC_OR_DEPARTMENT_OTHER): Payer: 59

## 2018-10-14 DIAGNOSIS — S92351A Displaced fracture of fifth metatarsal bone, right foot, initial encounter for closed fracture: Secondary | ICD-10-CM

## 2018-10-14 MED ORDER — OXYCODONE-ACETAMINOPHEN 5-325 MG PO TABS: 1.0000 | ORAL_TABLET | ORAL | 0 refills | Status: DC | PRN

## 2018-10-14 MED ORDER — OXYCODONE-ACETAMINOPHEN 5-325 MG PO TABS
1.0000 | ORAL_TABLET | Freq: Once | ORAL | Status: AC
  Administered 2018-10-14: 1 via ORAL
  Filled 2018-10-14: qty 1

## 2018-10-14 NOTE — ED Provider Notes (Signed)
Emergency Department Provider Note   I have reviewed the triage vital signs and the nursing notes.   HISTORY  Chief Complaint Foot Pain   HPI [name] is a 43 y.o. female with a history of renal transplant on multiple immunosuppression drugs to include prednisone the presents the emergency department today secondary to right lateral foot pain.  Patient states that she has had multiple fractures in the past and this feels similar to those.  She gets fractures very easily with just walking secondary to her chronic steroid use she has weak bones.  She does not remember any trauma.  She did recently start exercising but does not remember doing anything super strenuous.  No fevers, nausea, vomiting.  No other associated symptoms. No other associated or modifying symptoms.    Past Medical History:  Diagnosis Date  . Diabetes mellitus   . Pyelonephritis 09/30/11  . Renal failure   . S/P kidney transplant     Patient Active Problem List   Diagnosis Date Noted  . Leukocytosis 10/02/2011  . Bacterial vaginosis 10/01/2011  . E. coli UTI 09/30/2011  . PERITONITIS 03/30/2010  . HEMOCCULT POSITIVE STOOL 10/08/2009  . End stage renal disease ([place]) 08/21/2008  . GASTROPARESIS 11/22/2007  . DIABETES MELLITUS, TYPE II 10/03/2007  . 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 HYPERTENSION 10/03/2007  . PEPTIC ULCER DISEASE 10/03/2007    Past Surgical History:  Procedure Laterality Date  . CESAREAN SECTION  1996  . KIDNEY TRANSPLANT  03/2010   right  . loop av graft  05/2009   left upper arm  . PERITONEAL CATHETER INSERTION  ~ 2003  . TUBAL LIGATION  2000    Current Outpatient Rx  . Order #: [id number] Class: Historical Med  . Order #: [id number] Class: Historical Med  . Order #: [id number] Class: Historical Med  . Order #: [id number] Class: Print  . Order #: [id number] Class: Normal  . Order #: [id number] Class: Historical Med  . Order #: [id number] Class: Historical Med  . Order #:  [id number] Class: Historical Med  . Order #: [id number] Class: Historical Med  . Order #: [id number] Class: Historical Med  . Order #: [id number] Class: Print  . Order #: [id number] Class: Historical Med  . Order #: [id number] Class: Historical Med    Allergies Patient has no known allergies.  Family History  Problem Relation Age of Onset  . Diabetes Mother   . Arthritis Mother   . Arthritis Father   . Prostate cancer Father   . Hypertension Father     Social History Social History   Tobacco Use  . Smoking status: Never Smoker  . Smokeless tobacco: Never Used  Substance Use Topics  . Alcohol use: No  . Drug use: No    Review of Systems  All other systems negative except as documented in the HPI. All pertinent positives and negatives as reviewed in the HPI. ____________________________________________   PHYSICAL EXAM:  VITAL SIGNS: ED Triage Vitals  Enc Vitals Group     BP 10/13/18 2350 136/76     Pulse Rate 10/13/18 2350 83     Resp 10/13/18 2350 12     Temp 10/13/18 2350 98 F (36.7 C)     Temp Source 10/13/18 2350 Oral     SpO2 10/13/18 2350 100 %     Weight 10/13/18 2348 222 lb (100.7 kg)     Height 10/13/18 2348 5\' 5"  (1.651 m)    Constitutional: Alert and oriented. Well appearing and in no acute distress. Eyes: Conjunctivae are  normal. PERRL. EOMI. Head: Atraumatic. Nose: No congestion/rhinnorhea. Mouth/Throat: Mucous membranes are moist.  Oropharynx non-erythematous. Neck: No stridor.  No meningeal signs.   Cardiovascular: Normal rate, regular rhythm. Good peripheral circulation. Grossly normal heart sounds.   Respiratory: Normal respiratory effort.  No retractions. Lungs CTAB. Gastrointestinal: Soft and nontender. No distention.  Musculoskeletal: No lower extremity tenderness nor edema. No gross deformities of extremities.  Tenderness right lateral foot Neurologic:  Normal speech and language. No gross focal neurologic deficits are appreciated.  Skin:  Skin is  warm, dry and intact.  Erythema, induration, warmness and tenderness to a 4 x 3 cm area on the right lateral foot.   ____________________________________________   RADIOLOGY  Dg Foot Complete Right  Result Date: 10/14/2018 CLINICAL DATA:  Right foot pain for 2 days. Lateral pain after zumba. EXAM: RIGHT FOOT COMPLETE - 3+ VIEW COMPARISON:  None. FINDINGS: There is a transverse nondisplaced fracture of the proximal/mid shaft of the fifth metatarsal bone. No articular involvement. There is deformity and lucency of the proximal aspect of the fourth metatarsal bone. This is poorly demonstrated due to overlap but probably represents old fracture deformity. Less likely to be an acute injury or cystic lesion. Vascular calcifications in the soft tissues. IMPRESSION: Acute nondisplaced fracture of the proximal/mid shaft of the fifth metatarsal bone. Electronically Signed   By: Lucienne Capers M.D.   On: 10/14/2018 01:39    ____________________________________________   PROCEDURES  Procedure(s) performed:   Procedures   ____________________________________________   INITIAL IMPRESSION / ASSESSMENT AND PLAN / ED COURSE  Fifth metatarsal fracture.  Put in postop shoe with crutches.  She will follow-up with her orthopedic doctor on Tuesday she already has appointment.  Pain controlled and pain medication provided.     Pertinent labs & imaging results that were available during my care of the patient were reviewed by me and considered in my medical decision making (see chart for details).  ____________________________________________  FINAL CLINICAL IMPRESSION(S) / ED DIAGNOSES  Final diagnoses:  Closed displaced fracture of fifth metatarsal bone of right foot, initial encounter     MEDICATIONS GIVEN DURING THIS VISIT:  Medications  oxyCODONE-acetaminophen (PERCOCET/ROXICET) 5-325 MG per tablet 1 tablet (1 tablet Oral Given 10/14/18 0159)     NEW OUTPATIENT MEDICATIONS STARTED  DURING THIS VISIT:  New Prescriptions   OXYCODONE-ACETAMINOPHEN (PERCOCET) 5-325 MG TABLET    Take 1 tablet by mouth every 4 (four) hours as needed.    Note:  This note was prepared with assistance of Dragon voice recognition software. Occasional wrong-word or sound-a-like substitutions may have occurred due to the inherent limitations of voice recognition software.   Trevan Messman, Corene Cornea, MD 10/14/18 [phone]

## 2018-10-16 ENCOUNTER — Ambulatory Visit (INDEPENDENT_AMBULATORY_CARE_PROVIDER_SITE_OTHER): Payer: 59 | Admitting: Podiatry

## 2018-10-16 ENCOUNTER — Encounter: Payer: Self-pay | Admitting: Podiatry

## 2018-10-16 DIAGNOSIS — S92354A Nondisplaced fracture of fifth metatarsal bone, right foot, initial encounter for closed fracture: Secondary | ICD-10-CM | POA: Diagnosis not present

## 2018-10-16 NOTE — Progress Notes (Signed)
Subjective:  Patient ID: [name], female    DOB: Feb 19, 1976,  MRN: [id number] HPI Chief Complaint  Patient presents with  . Foot Pain    Patient presents today for right lateral foot pain x 1 week.  She states "I didn't hurt my foot it just started hurting all of a sudden.  Now its very painful to walk."  She was seen at Urgent care last week and dx with fracture 5th met.  She reports its tender to touch on the side of her foot and swells. She was given post op shoe and Oxycodone which gives slight relief.    43 y.o. female presents with the above complaint.   ROS: She denies fever chills nausea vomiting muscle aches pains calf pain back pain chest pain shortness of breath.  Past Medical History:  Diagnosis Date  . Diabetes mellitus   . Pyelonephritis 09/30/11  . Renal failure   . S/P kidney transplant    Past Surgical History:  Procedure Laterality Date  . CESAREAN SECTION  1996  . KIDNEY TRANSPLANT  03/2010   right  . loop av graft  05/2009   left upper arm  . PERITONEAL CATHETER INSERTION  ~ 2003  . TUBAL LIGATION  2000    Current Outpatient Medications:  .  atorvastatin (LIPITOR) 10 MG tablet, Take by mouth., Disp: , Rfl:  .  Insulin Lispro 200 UNIT/ML SOPN, Inject 10 units before breakfast and 12 units before lunch and supper, Disp: , Rfl:  .  loperamide (IMODIUM A-D) 2 MG tablet, Take by mouth., Disp: , Rfl:  .  albuterol (PROVENTIL HFA;VENTOLIN HFA) 108 (90 BASE) MCG/ACT inhaler, Inhale 2 puffs into the lungs every 6 (six) hours as needed for wheezing or shortness of breath., Disp: , Rfl:  .  amLODipine (NORVASC) 5 MG tablet, Take 5 mg by mouth daily.  , Disp: , Rfl:  .  Cholecalciferol (VITAMIN D3) 125 MCG (5000 UT) CAPS, Take by mouth daily., Disp: , Rfl:  .  EPINEPHrine (EPIPEN) 0.3 mg/0.3 mL IJ SOAJ injection, Inject 0.3 mLs (0.3 mg total) into the muscle as needed. (Patient not taking: Reported on 10/10/2018), Disp: 2 Device, Rfl: 0 .  Insulin Degludec  (TRESIBA FLEXTOUCH) 200 UNIT/ML SOPN, Inject 60 Units into the skin at bedtime., Disp: 6 pen, Rfl: 2 .  levonorgestrel (MIRENA) 20 MCG/24HR IUD, by Intrauterine route., Disp: , Rfl:  .  metFORMIN (GLUCOPHAGE) 500 MG tablet, Take by mouth 2 (two) times daily with a meal., Disp: , Rfl:  .  mycophenolate (CELLCEPT) 250 MG capsule, Take 750 mg by mouth every evening.  , Disp: , Rfl:  .  omeprazole (PRILOSEC) 40 MG capsule, Take 40 mg by mouth daily., Disp: , Rfl:  .  oxyCODONE-acetaminophen (PERCOCET) 5-325 MG tablet, Take 1 tablet by mouth every 4 (four) hours as needed., Disp: 10 tablet, Rfl: 0 .  predniSONE (DELTASONE) 5 MG tablet, Take 5 mg by mouth daily.  , Disp: , Rfl:  .  tacrolimus (PROGRAF) 1 MG capsule, Take 3 mg by mouth 2 (two) times daily. 2.5 tablets in the am and 3 tablets in the pm, Disp: , Rfl:   No Known Allergies Review of Systems Objective:  There were no vitals filed for this visit.  General: Well developed, nourished, in no acute distress, alert and oriented x3   Dermatological: Skin is warm, dry and supple bilateral. Nails x 10 are well maintained; remaining integument appears unremarkable at this time. There are no  open sores, no preulcerative lesions, no rash or signs of infection present.  Vascular: Dorsalis Pedis artery and Posterior Tibial artery pedal pulses are 2/4 bilateral with immedate capillary fill time. Pedal hair growth present. No varicosities and no lower extremity edema present bilateral.   Neruologic: Grossly intact via light touch bilateral. Vibratory intact via tuning fork bilateral. Protective threshold with Semmes Wienstein monofilament intact to all pedal sites bilateral. Patellar and Achilles deep tendon reflexes 2+ bilateral. No Babinski or clonus noted bilateral.   Musculoskeletal: No gross boney pedal deformities bilateral. No pain, crepitus, or limitation noted with foot and ankle range of motion bilateral. Muscular strength 5/5 in all groups  tested bilateral.  Pain and swelling on the lateral aspect of the right foot with pain on direct palpation of the fifth metatarsal of the right foot.  No ecchymosis.  Gait: Unassisted, Nonantalgic.    Radiographs:  Radiographs reviewed taken last week demonstrates transverse nondisplaced fracture mid diaphyseal region of the fifth metatarsal.  Just past the Jones fracture site.  Assessment & Plan:   Assessment: Fractured fifth metatarsal nondisplaced  Plan: Placed her in a cam walker encouraged her not to walk utilizing crutches follow-up with me in 4 weeks for x-rays.  She is going to be slow to heal because of her history of prednisone usage as well as diabetes.      T. [place], [place]

## 2018-10-17 ENCOUNTER — Ambulatory Visit: Payer: 59 | Admitting: Podiatry

## 2018-10-22 ENCOUNTER — Encounter: Payer: Self-pay | Admitting: Internal Medicine

## 2018-10-22 NOTE — Progress Notes (Signed)
Subjective:     Patient ID: [name] , female    DOB: [birth year] , 43 y.o.   MRN: [id number]   Chief Complaint  Patient presents with  . Establish Care  . Diabetes  . Hypertension    HPI  She is here today to re-establish primary care. She was a patient here years ago, and then she moved to [place], [place]. Her company has moved her back to [place]. She reports h/o diabetes and high blood pressure. Of note, she is also the recipient of a kidney transplant. She is employed at [employer]. She admits that she does a lot of traveling with her job.   Diabetes  She presents for her follow-up diabetic visit. She has type 2 diabetes mellitus. Her disease course has been stable. There are no hypoglycemic associated symptoms. Pertinent negatives for diabetes include no blurred vision and no chest pain. There are no hypoglycemic complications. Diabetic complications include nephropathy. Risk factors for coronary artery disease include diabetes mellitus, hypertension, dyslipidemia, sedentary lifestyle and obesity.  Hypertension  This is a chronic problem. The current episode started more than 1 year ago. The problem has been gradually improving since onset. The problem is controlled. Pertinent negatives include no blurred vision, chest pain, palpitations or shortness of breath.  She reports compliance with meds.    Past Medical History:  Diagnosis Date  . Diabetes mellitus   . HTN (hypertension)   . Pyelonephritis 09/30/11  . Renal failure   . S/P kidney transplant      Family History  Problem Relation Age of Onset  . Diabetes Mother   . Arthritis Mother   . Arthritis Father   . Prostate cancer Father   . Hypertension Father      Current Outpatient Medications:  .  albuterol (PROVENTIL HFA;VENTOLIN HFA) 108 (90 BASE) MCG/ACT inhaler, Inhale 2 puffs into the lungs every 6 (six) hours as needed for wheezing or shortness of breath., Disp: , Rfl:  .  amLODipine (NORVASC) 5 MG tablet,  Take 5 mg by mouth daily.  , Disp: , Rfl:  .  Cholecalciferol (VITAMIN D3) 125 MCG (5000 UT) CAPS, Take by mouth daily., Disp: , Rfl:  .  Insulin Degludec (TRESIBA FLEXTOUCH) 200 UNIT/ML SOPN, Inject 60 Units into the skin at bedtime., Disp: 6 pen, Rfl: 2 .  metFORMIN (GLUCOPHAGE) 500 MG tablet, Take by mouth 2 (two) times daily with a meal., Disp: , Rfl:  .  mycophenolate (CELLCEPT) 250 MG capsule, Take 750 mg by mouth every evening.  , Disp: , Rfl:  .  omeprazole (PRILOSEC) 40 MG capsule, Take 40 mg by mouth daily., Disp: , Rfl:  .  predniSONE (DELTASONE) 5 MG tablet, Take 5 mg by mouth daily.  , Disp: , Rfl:  .  tacrolimus (PROGRAF) 1 MG capsule, Take 3 mg by mouth 2 (two) times daily. 2.5 tablets in the am and 3 tablets in the pm, Disp: , Rfl:  .  atorvastatin (LIPITOR) 10 MG tablet, Take by mouth., Disp: , Rfl:  .  EPINEPHrine (EPIPEN) 0.3 mg/0.3 mL IJ SOAJ injection, Inject 0.3 mLs (0.3 mg total) into the muscle as needed. (Patient not taking: Reported on 10/10/2018), Disp: 2 Device, Rfl: 0 .  Insulin Lispro 200 UNIT/ML SOPN, Inject 10 units before breakfast and 12 units before lunch and supper, Disp: , Rfl:  .  levonorgestrel (MIRENA) 20 MCG/24HR IUD, by Intrauterine route., Disp: , Rfl:  .  loperamide (IMODIUM A-D) 2 MG tablet, Take by  mouth., Disp: , Rfl:  .  oxyCODONE-acetaminophen (PERCOCET) 5-325 MG tablet, Take 1 tablet by mouth every 4 (four) hours as needed., Disp: 10 tablet, Rfl: 0   No Known Allergies   Review of Systems  Constitutional: Negative.   HENT: Negative.   Eyes: Negative for blurred vision.  Respiratory: Negative.  Negative for shortness of breath.   Cardiovascular: Negative.  Negative for chest pain and palpitations.  Gastrointestinal: Negative.   Genitourinary: Negative.   Neurological: Negative.   Psychiatric/Behavioral: Negative.      Today's Vitals   10/10/18 1106  BP: 124/82  Pulse: 83  Temp: 98.3 F (36.8 C)  TempSrc: Oral  Weight: 227 lb 3.2 oz  (103.1 kg)  Height: 5' 4"  (1.626 m)   Body mass index is 39 kg/m.   Objective:  Physical Exam Vitals signs and nursing note reviewed.  Constitutional:      Appearance: Normal appearance. She is obese.  HENT:     Head: Normocephalic and atraumatic.  Cardiovascular:     Rate and Rhythm: Normal rate and regular rhythm.     Heart sounds: Normal heart sounds.  Pulmonary:     Effort: Pulmonary effort is normal.     Breath sounds: Normal breath sounds.  Skin:    General: Skin is warm.  Neurological:     General: No focal deficit present.     Mental Status: She is alert.  Psychiatric:        Mood and Affect: Mood normal.         Assessment And Plan:     1. Type 2 diabetes mellitus with stage 2 chronic kidney disease, with long-term current use of insulin ([place])  I will check labs as listed below. Importance of regular exercise was discussed with the patient. She is encouraged to avoid sugary beverages including both juices and sodas.  She is also encouraged to avoid refined carbs.  We also discussed the use of GLP-1 agonists to help with DM control.  She denies family h/o thyroid cancer. She was instructed on how to self-administer Ozempic. She will start with 0.83m x 2 weeks, then increase to 0.579monce weekly. She will rto in four to six weeks for re-evaluation.   - CMP14+EGFR - Hemoglobin A1c - C-peptide - TSH - POCT Urinalysis Dipstick (81002) - POCT UA - Microalbumin  2. Chronic renal disease, stage II  Chronic. I will check a GFR, Cr today.  I also reviewed some of her records in Care everywhere.   3. Hypertensive nephropathy  Well controlled. She will continue with current meds. She is encouraged to avoid adding salt to her foods.  I will check a baseline ekg.  - EKG 12-Lead  4. Class 2 severe obesity due to excess calories with serious comorbidity and body mass index (BMI) of 39.0 to 39.9 in adult ([facility] She is encouraged to strive for BMI less than 30 to  decrease cardiac risk. She is encouraged to incorporate more exercise into her daily routine. She is advised to work up to 30 minutes five days weekly.   5. Kidney transplant recipient   Joyce GreenlandMD

## 2018-10-25 ENCOUNTER — Encounter (HOSPITAL_BASED_OUTPATIENT_CLINIC_OR_DEPARTMENT_OTHER): Payer: Self-pay | Admitting: *Deleted

## 2018-10-25 ENCOUNTER — Other Ambulatory Visit: Payer: Self-pay

## 2018-10-26 ENCOUNTER — Encounter: Payer: Self-pay | Admitting: Podiatry

## 2018-10-26 ENCOUNTER — Encounter: Payer: Self-pay | Admitting: Internal Medicine

## 2018-10-30 ENCOUNTER — Encounter: Payer: Self-pay | Admitting: Internal Medicine

## 2018-11-01 ENCOUNTER — Encounter (HOSPITAL_BASED_OUTPATIENT_CLINIC_OR_DEPARTMENT_OTHER): Payer: Self-pay

## 2018-11-02 ENCOUNTER — Encounter (HOSPITAL_BASED_OUTPATIENT_CLINIC_OR_DEPARTMENT_OTHER): Payer: Self-pay

## 2018-11-02 ENCOUNTER — Encounter (HOSPITAL_BASED_OUTPATIENT_CLINIC_OR_DEPARTMENT_OTHER)
Admission: RE | Disposition: A | Discharge status: Home / Self Care | Payer: Self-pay | Source: Home / Self Care | Attending: Orthopedic Surgery

## 2018-11-02 ENCOUNTER — Other Ambulatory Visit: Payer: Self-pay

## 2018-11-02 ENCOUNTER — Ambulatory Visit (HOSPITAL_BASED_OUTPATIENT_CLINIC_OR_DEPARTMENT_OTHER): Payer: 59 | Admitting: Certified Registered"

## 2018-11-02 ENCOUNTER — Ambulatory Visit (HOSPITAL_BASED_OUTPATIENT_CLINIC_OR_DEPARTMENT_OTHER)
Admission: RE | Admit: 2018-11-02 | Discharge: 2018-11-02 | Disposition: A | Discharge status: Home / Self Care | Payer: 59 | Attending: Orthopedic Surgery | Admitting: Orthopedic Surgery

## 2018-11-02 DIAGNOSIS — E119 Type 2 diabetes mellitus without complications: Secondary | ICD-10-CM | POA: Diagnosis not present

## 2018-11-02 DIAGNOSIS — M1990 Unspecified osteoarthritis, unspecified site: Secondary | ICD-10-CM | POA: Insufficient documentation

## 2018-11-02 DIAGNOSIS — Z888 Allergy status to other drugs, medicaments and biological substances status: Secondary | ICD-10-CM | POA: Diagnosis not present

## 2018-11-02 DIAGNOSIS — Z79899 Other long term (current) drug therapy: Secondary | ICD-10-CM | POA: Insufficient documentation

## 2018-11-02 DIAGNOSIS — Z793 Long term (current) use of hormonal contraceptives: Secondary | ICD-10-CM | POA: Diagnosis not present

## 2018-11-02 DIAGNOSIS — Z940 Kidney transplant status: Secondary | ICD-10-CM | POA: Insufficient documentation

## 2018-11-02 DIAGNOSIS — G5601 Carpal tunnel syndrome, right upper limb: Secondary | ICD-10-CM | POA: Diagnosis not present

## 2018-11-02 DIAGNOSIS — Z975 Presence of (intrauterine) contraceptive device: Secondary | ICD-10-CM | POA: Diagnosis not present

## 2018-11-02 DIAGNOSIS — I10 Essential (primary) hypertension: Secondary | ICD-10-CM | POA: Diagnosis not present

## 2018-11-02 DIAGNOSIS — Z7984 Long term (current) use of oral hypoglycemic drugs: Secondary | ICD-10-CM | POA: Diagnosis not present

## 2018-11-02 DIAGNOSIS — G5621 Lesion of ulnar nerve, right upper limb: Secondary | ICD-10-CM | POA: Insufficient documentation

## 2018-11-02 HISTORY — PX: CARPAL TUNNEL RELEASE: SHX101

## 2018-11-02 HISTORY — PX: ULNAR NERVE TRANSPOSITION: SHX2595

## 2018-11-02 HISTORY — DX: Unspecified osteoarthritis, unspecified site: M19.90

## 2018-11-02 LAB — GLUCOSE, CAPILLARY
Glucose-Capillary: 170 mg/dL — ABNORMAL HIGH (ref 70–99)
Glucose-Capillary: 159 mg/dL — ABNORMAL HIGH (ref 70–99)

## 2018-11-02 SURGERY — CARPAL TUNNEL RELEASE
Anesthesia: General | Laterality: Right

## 2018-11-02 MED ORDER — PROPOFOL 500 MG/50ML IV EMUL
INTRAVENOUS | Status: DC | PRN
  Administered 2018-11-02: 25 ug/kg/min via INTRAVENOUS

## 2018-11-02 MED ORDER — ROPIVACAINE HCL 7.5 MG/ML IJ SOLN
INTRAMUSCULAR | Status: DC | PRN
  Administered 2018-11-02: 25 mL via PERINEURAL

## 2018-11-02 MED ORDER — FENTANYL CITRATE (PF) 100 MCG/2ML IJ SOLN: 25.0000 ug | INTRAMUSCULAR | Status: DC | PRN

## 2018-11-02 MED ORDER — FENTANYL CITRATE (PF) 100 MCG/2ML IJ SOLN
INTRAMUSCULAR | Status: AC
  Filled 2018-11-02: qty 2

## 2018-11-02 MED ORDER — PHENYLEPHRINE HCL 10 MG/ML IJ SOLN
INTRAMUSCULAR | Status: DC | PRN
  Administered 2018-11-02 (×3): 40 ug via INTRAVENOUS

## 2018-11-02 MED ORDER — OXYCODONE HCL 5 MG PO TABS: 5.0000 mg | ORAL_TABLET | Freq: Once | ORAL | Status: DC | PRN

## 2018-11-02 MED ORDER — SCOPOLAMINE 1 MG/3DAYS TD PT72: 1.0000 | MEDICATED_PATCH | Freq: Once | TRANSDERMAL | Status: DC | PRN

## 2018-11-02 MED ORDER — OXYCODONE HCL 5 MG/5ML PO SOLN: 5.0000 mg | Freq: Once | ORAL | Status: DC | PRN

## 2018-11-02 MED ORDER — CEFAZOLIN SODIUM-DEXTROSE 2-4 GM/100ML-% IV SOLN
INTRAVENOUS | Status: AC
  Filled 2018-11-02: qty 100

## 2018-11-02 MED ORDER — ACETAMINOPHEN 160 MG/5ML PO SOLN: 325.0000 mg | ORAL | Status: DC | PRN

## 2018-11-02 MED ORDER — PROPOFOL 10 MG/ML IV BOLUS
INTRAVENOUS | Status: DC | PRN
  Administered 2018-11-02: 120 mg via INTRAVENOUS

## 2018-11-02 MED ORDER — ACETAMINOPHEN 325 MG PO TABS: 325.0000 mg | ORAL_TABLET | ORAL | Status: DC | PRN

## 2018-11-02 MED ORDER — CEFAZOLIN SODIUM-DEXTROSE 2-4 GM/100ML-% IV SOLN
2.0000 g | INTRAVENOUS | Status: AC
  Administered 2018-11-02: 2 g via INTRAVENOUS

## 2018-11-02 MED ORDER — ONDANSETRON HCL 4 MG/2ML IJ SOLN
INTRAMUSCULAR | Status: DC | PRN
  Administered 2018-11-02: 4 mg via INTRAVENOUS

## 2018-11-02 MED ORDER — LIDOCAINE HCL (CARDIAC) PF 100 MG/5ML IV SOSY
PREFILLED_SYRINGE | INTRAVENOUS | Status: DC | PRN
  Administered 2018-11-02: 30 mg via INTRAVENOUS

## 2018-11-02 MED ORDER — ONDANSETRON HCL 4 MG/2ML IJ SOLN: 4.0000 mg | Freq: Once | INTRAMUSCULAR | Status: DC | PRN

## 2018-11-02 MED ORDER — 0.9 % SODIUM CHLORIDE (POUR BTL) OPTIME
TOPICAL | Status: DC | PRN
  Administered 2018-11-02: 1000 mL

## 2018-11-02 MED ORDER — HYDROCODONE-ACETAMINOPHEN 5-325 MG PO TABS: ORAL_TABLET | ORAL | 0 refills | Status: DC

## 2018-11-02 MED ORDER — MIDAZOLAM HCL 2 MG/2ML IJ SOLN
INTRAMUSCULAR | Status: AC
  Filled 2018-11-02: qty 2

## 2018-11-02 MED ORDER — DEXAMETHASONE SODIUM PHOSPHATE 10 MG/ML IJ SOLN
INTRAMUSCULAR | Status: DC | PRN
  Administered 2018-11-02: 4 mg via INTRAVENOUS

## 2018-11-02 MED ORDER — MIDAZOLAM HCL 2 MG/2ML IJ SOLN
1.0000 mg | INTRAMUSCULAR | Status: DC | PRN
  Administered 2018-11-02: 2 mg via INTRAVENOUS

## 2018-11-02 MED ORDER — LACTATED RINGERS IV SOLN
INTRAVENOUS | Status: DC
  Administered 2018-11-02: 08:00:00 via INTRAVENOUS

## 2018-11-02 MED ORDER — FENTANYL CITRATE (PF) 100 MCG/2ML IJ SOLN
50.0000 ug | INTRAMUSCULAR | Status: DC | PRN
  Administered 2018-11-02: 100 ug via INTRAVENOUS

## 2018-11-02 MED ORDER — MEPERIDINE HCL 25 MG/ML IJ SOLN: 6.2500 mg | INTRAMUSCULAR | Status: DC | PRN

## 2018-11-02 SURGICAL SUPPLY — 55 items
BANDAGE ACE 3X5.8 VEL STRL LF (GAUZE/BANDAGES/DRESSINGS) ×4 IMPLANT
BANDAGE ACE 4X5 VEL STRL LF (GAUZE/BANDAGES/DRESSINGS) ×2 IMPLANT
BLADE MINI RND TIP GREEN BEAV (BLADE) IMPLANT
BLADE SURG 15 STRL LF DISP TIS (BLADE) ×2 IMPLANT
BLADE SURG 15 STRL SS (BLADE) ×2
BNDG ESMARK 4X9 LF (GAUZE/BANDAGES/DRESSINGS) ×2 IMPLANT
BNDG GAUZE ELAST 4 BULKY (GAUZE/BANDAGES/DRESSINGS) ×2 IMPLANT
CHLORAPREP W/TINT 26ML (MISCELLANEOUS) ×2 IMPLANT
CORD BIPOLAR FORCEPS 12FT (ELECTRODE) ×2 IMPLANT
COVER BACK TABLE 60X90IN (DRAPES) ×2 IMPLANT
COVER MAYO STAND STRL (DRAPES) ×2 IMPLANT
COVER WAND RF STERILE (DRAPES) IMPLANT
CUFF TOURN SGL LL 18 NRW (TOURNIQUET CUFF) ×2 IMPLANT
CUFF TOURNIQUET SINGLE 18IN (TOURNIQUET CUFF) ×2 IMPLANT
CUFF TOURNIQUET SINGLE 24IN (TOURNIQUET CUFF) ×2 IMPLANT
DECANTER SPIKE VIAL GLASS SM (MISCELLANEOUS) IMPLANT
DRAPE EXTREMITY T 121X128X90 (DISPOSABLE) ×2 IMPLANT
DRAPE SURG 17X23 STRL (DRAPES) ×2 IMPLANT
DRSG PAD ABDOMINAL 8X10 ST (GAUZE/BANDAGES/DRESSINGS) ×2 IMPLANT
GAUZE 4X4 16PLY RFD (DISPOSABLE) IMPLANT
GAUZE SPONGE 4X4 12PLY STRL (GAUZE/BANDAGES/DRESSINGS) ×2 IMPLANT
GAUZE XEROFORM 1X8 LF (GAUZE/BANDAGES/DRESSINGS) ×2 IMPLANT
GLOVE BIO SURGEON STRL SZ7.5 (GLOVE) ×2 IMPLANT
GLOVE BIOGEL PI IND STRL 8 (GLOVE) ×1 IMPLANT
GLOVE BIOGEL PI IND STRL 8.5 (GLOVE) ×1 IMPLANT
GLOVE BIOGEL PI INDICATOR 8 (GLOVE) ×1
GLOVE BIOGEL PI INDICATOR 8.5 (GLOVE) ×1
GLOVE SURG ORTHO 8.0 STRL STRW (GLOVE) ×2 IMPLANT
GOWN STRL REUS W/ TWL LRG LVL3 (GOWN DISPOSABLE) ×1 IMPLANT
GOWN STRL REUS W/TWL LRG LVL3 (GOWN DISPOSABLE) ×1
GOWN STRL REUS W/TWL XL LVL3 (GOWN DISPOSABLE) ×4 IMPLANT
NEEDLE HYPO 25X1 1.5 SAFETY (NEEDLE) ×2 IMPLANT
NS IRRIG 1000ML POUR BTL (IV SOLUTION) ×2 IMPLANT
PACK BASIN DAY SURGERY FS (CUSTOM PROCEDURE TRAY) ×2 IMPLANT
PAD CAST 3X4 CTTN HI CHSV (CAST SUPPLIES) ×1 IMPLANT
PAD CAST 4YDX4 CTTN HI CHSV (CAST SUPPLIES) ×1 IMPLANT
PADDING CAST ABS 4INX4YD NS (CAST SUPPLIES) ×1
PADDING CAST ABS COTTON 4X4 ST (CAST SUPPLIES) ×1 IMPLANT
PADDING CAST COTTON 3X4 STRL (CAST SUPPLIES) ×1
PADDING CAST COTTON 4X4 STRL (CAST SUPPLIES) ×1
SLEEVE SCD COMPRESS KNEE MED (MISCELLANEOUS) ×2 IMPLANT
SLING ARM FOAM STRAP MED (SOFTGOODS) ×2 IMPLANT
SPLINT FAST PLASTER 5X30 (CAST SUPPLIES)
SPLINT PLASTER CAST FAST 5X30 (CAST SUPPLIES) IMPLANT
SPLINT PLASTER CAST XFAST 3X15 (CAST SUPPLIES) IMPLANT
SPLINT PLASTER XTRA FASTSET 3X (CAST SUPPLIES)
STOCKINETTE 4X48 STRL (DRAPES) ×2 IMPLANT
SUT ETHILON 4 0 PS 2 18 (SUTURE) ×4 IMPLANT
SUT VIC AB 2-0 SH 27 (SUTURE) ×1
SUT VIC AB 2-0 SH 27XBRD (SUTURE) ×1 IMPLANT
SUT VICRYL 4-0 PS2 18IN ABS (SUTURE) ×2 IMPLANT
SYR BULB 3OZ (MISCELLANEOUS) ×2 IMPLANT
SYR CONTROL 10ML LL (SYRINGE) ×2 IMPLANT
TOWEL GREEN STERILE FF (TOWEL DISPOSABLE) ×4 IMPLANT
UNDERPAD 30X30 (UNDERPADS AND DIAPERS) ×2 IMPLANT

## 2018-11-02 NOTE — Discharge Instructions (Addendum)
° °  ° ° ° °Hand Center Instructions °Hand Surgery ° °Wound Care: °Keep your hand elevated above the level of your heart.  Do not allow it to dangle by your side.  Keep the dressing dry and do not remove it unless your doctor advises you to do so.  He will usually change it at the time of your post-op visit.  Moving your fingers is advised to stimulate circulation but will depend on the site of your surgery.  If you have a splint applied, your doctor will advise you regarding movement. ° °Activity: °Do not drive or operate machinery today.  Rest today and then you may return to your normal activity and work as indicated by your physician. ° °Diet:  °Drink liquids today or eat a light diet.  You may resume a regular diet tomorrow.   ° °General expectations: °Pain for two to three days. °Fingers may become slightly swollen. ° °Call your doctor if any of the following occur: °Severe pain not relieved by pain medication. °Elevated temperature. °Dressing soaked with blood. °Inability to move fingers. °White or bluish color to fingers. ° ° °Regional Anesthesia Blocks ° °1. Numbness or the inability to move the "blocked" extremity may last from 3-48 hours after placement. The length of time depends on the medication injected and your individual response to the medication. If the numbness is not going away after 48 hours, call your surgeon. ° °2. The extremity that is blocked will need to be protected until the numbness is gone and the  Strength has returned. Because you cannot feel it, you will need to take extra care to avoid injury. Because it may be weak, you may have difficulty moving it or using it. You may not know what position it is in without looking at it while the block is in effect. ° °3. For blocks in the legs and feet, returning to weight bearing and walking needs to be done carefully. You will need to wait until the numbness is entirely gone and the strength has returned. You should be able to move your leg  and foot normally before you try and bear weight or walk. You will need someone to be with you when you first try to ensure you do not fall and possibly risk injury. ° °4. Bruising and tenderness at the needle site are common side effects and will resolve in a few days. ° °5. Persistent numbness or new problems with movement should be communicated to the surgeon or the [facility] ([phone])/ [facility] ([phone]). ° ° ° °Post Anesthesia Home Care Instructions ° °Activity: °Get plenty of rest for the remainder of the day. A responsible individual must stay with you for 24 hours following the procedure.  °For the next 24 hours, DO NOT: °-Drive a car °-Operate machinery °-Drink alcoholic beverages °-Take any medication unless instructed by your physician °-Make any legal decisions or sign important papers. ° °Meals: °Start with liquid foods such as gelatin or soup. Progress to regular foods as tolerated. Avoid greasy, spicy, heavy foods. If nausea and/or vomiting occur, drink only clear liquids until the nausea and/or vomiting subsides. Call your physician if vomiting continues. ° °Special Instructions/Symptoms: °Your throat may feel dry or sore from the anesthesia or the breathing tube placed in your throat during surgery. If this causes discomfort, gargle with warm salt water. The discomfort should disappear within 24 hours. ° °If you had a scopolamine patch placed behind your ear for   the management of post- operative nausea and/or vomiting: ° °1. The medication in the patch is effective for 72 hours, after which it should be removed.  Wrap patch in a tissue and discard in the trash. Wash hands thoroughly with soap and water. °2. You may remove the patch earlier than 72 hours if you experience unpleasant side effects which may include dry mouth, dizziness or visual disturbances. °3. Avoid touching the patch. Wash your hands with soap and water after contact with the patch. °  ° ° °

## 2018-11-02 NOTE — Progress Notes (Signed)
Assisted Dr. Oddono with right, ultrasound guided, supraclavicular block. Side rails up, monitors on throughout procedure. See vital signs in flow sheet. Tolerated Procedure well. 

## 2018-11-02 NOTE — Transfer of Care (Signed)
Immediate Anesthesia Transfer of Care Note  Patient: [name]  Procedure(s) Performed: RIGHT CARPAL TUNNEL RELEASE (Right ) RIGHT GUYON'S CANAL RELEASE (Right ) ULNAR NERVE DECOMPRESSION (Right )  Patient Location: PACU  Anesthesia Type:GA combined with regional for post-op pain  Level of Consciousness: drowsy and patient cooperative  Airway & Oxygen Therapy: Patient Spontanous Breathing and Patient connected to face mask oxygen  Post-op Assessment: Report given to RN and Post -op Vital signs reviewed and stable  Post vital signs: Reviewed and stable  Last Vitals:  Vitals Value Taken Time  BP    Temp    Pulse 110 11/02/2018 10:35 AM  Resp    SpO2 100 % 11/02/2018 10:35 AM  Vitals shown include unvalidated device data.  Last Pain:  Vitals:   11/02/18 0758  TempSrc: Oral  PainSc: 3       Patients Stated Pain Goal: 3 (07/29/47 6282)  Complications: No apparent anesthesia complications

## 2018-11-02 NOTE — Anesthesia Procedure Notes (Signed)
Procedure Name: LMA Insertion Date/Time: 11/02/2018 9:24 AM Performed by: Signe Colt, CRNA Pre-anesthesia Checklist: Patient identified, Emergency Drugs available, Suction available and Patient being monitored Patient Re-evaluated:Patient Re-evaluated prior to induction Oxygen Delivery Method: Circle system utilized Preoxygenation: Pre-oxygenation with 100% oxygen Induction Type: IV induction Ventilation: Mask ventilation without difficulty LMA: LMA inserted LMA Size: 4.0 Number of attempts: 1 Airway Equipment and Method: Bite block Placement Confirmation: positive ETCO2 Tube secured with: Tape Dental Injury: Teeth and Oropharynx as per pre-operative assessment

## 2018-11-02 NOTE — Op Note (Addendum)
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  Right ulnar nerve decompression at the elbow without transposition 2.  Right carpal tunnel release via separate incision 3.  Right ulnar nerve decompression at the wrist and hand through same incision   SURGEON:  Leanora Cover, M.D.   ASSISTANT: Daryll Brod, MD   ANESTHESIA:  General with regional   INTRAVENOUS FLUIDS:  Per anesthesia flow sheet.   ESTIMATED BLOOD LOSS:  Minimal.   COMPLICATIONS:  None.   SPECIMENS:  none   TOURNIQUET TIME:    Total Tourniquet Time Documented: Upper Arm (Right) - 50 minutes Total: Upper Arm (Right) - 50 minutes    DISPOSITION:  Stable to PACU.   INDICATIONS: 43 year old female with numbness and tingling of ring and small fingers right hand.  She has positive nerve conduction studies.  She wishes to have decompression of the right ulnar nerve at the elbow and wrist and a right carpal tunnel release. Risks, benefits and alternatives of surgery were discussed including the risks of blood loss, infection, damage to nerves, vessels, tendons, ligaments, bone for surgery, need for additional surgery, complications with wound healing, continued pain, nonunion, malunion, stiffness.  She voiced understanding of these risks and elected to proceed.  OPERATIVE COURSE:  After being identified preoperatively by myself,  the patient and I agreed on the procedure and site of the procedure.  The surgical site was marked.  Surgical consent had  been signed. She was given IV Ancef as preoperative antibiotic prophylaxis. She was transferred to the operating room and placed on the operating table in supine position with the Right upper extremity on an arm board.  General anesthesia was induced by the anesthesiologist. A regional block had been performed by anesthesia in preoperative holding.   Right upper extremity was prepped and draped in normal sterile orthopedic fashion.  A surgical pause was performed between the surgeons, anesthesia, and operating room staff and all were in agreement as to the patient, procedure, and site of procedure.  Tourniquet at the proximal aspect of the extremity was inflated to 250 mmHg after exsanguination of the arm with an Esmarch bandage.    Incision was made at the medial side of the elbow and carried into subcutaneous tissues by spreading technique.  Bipolar electrocautery was used to obtain hemostasis.  The ulnar nerve was identified proximal to Osborne's ligament.  It was then decompressed distally first through Osborne's ligament and the FCU fascia.  The FCU muscle was spread and the investing fascia around the nerve released.  A finger was placed into the wound to ensure complete decompression which was the case.  The nerve was then decompressed proximally again including the investing fascia.  The elbow was flexed.  There was no subluxation of the nerve.  The wound was copiously irrigated with sterile saline.  The anterior leaflet of Osborne's ligament was repaired to the subcutaneous tissues on the posterior aspect of the incision.  No compression over the nerve was created.  The back was then used  in an inverted operative fashion subcutaneous tissues and skin was closed with 4-0 nylon in a horizontal mattress fashion.  Incision was then made at the volar aspect of the palm and atypical carpal tunnel fashion.  This is again carried in subcutaneous tissues by spreading technique.  Bipolar electrocautery was used to  obtain hemostasis.  The ulnar nerve and artery were identified.  There were then decompressed through Guyon's canal to the level of the wrist.  They were decompressed distally.  The motor branch was traced deep and fascial bands over top of the nerve were released.  The flexor tendons were identified distal to the transverse carpal ligament.  The transverse carpal ligament was then identified from distal to proximal using a knife.  Good decompression was obtained.  The distal aspect of the volar antebrachial fascia was split using the scissors.  A finger was placed into the wound to ensure complete decompression which was the case.  The motor branch was identified and was intact.  The wound was copiously irrigated with sterile saline.  Was closed with 4-0 nylon in a horizontal mattress fashion.  The wounds were all dressed with sterile Xeroform 4 x 4's and ABDs and wrapped with Kerlix and Ace bandage.  The tourniquet was deflated at 50 minutes.  Fingertips were pink with brisk capillary refill after deflation of tourniquet.  The operative  drapes were broken down.  The patient was awoken from anesthesia safely.  She was transferred back to the stretcher and taken to PACU in stable condition.  I will see her back in the office in 1 week for postoperative followup.  I will give her a prescription for Norco 5/325 1-2 tabs PO q6 hours prn pain, dispense # 30.   Leanora Cover, MD Electronically signed, 11/02/18

## 2018-11-02 NOTE — Anesthesia Preprocedure Evaluation (Signed)
Anesthesia Evaluation  Patient identified by MRN, date of birth, ID band Patient awake    Reviewed: Allergy & Precautions, H&P , NPO status , Patient's Chart, lab work & pertinent test results, reviewed documented beta blocker date and time   Airway Mallampati: II  TM Distance: >3 FB Neck ROM: full    Dental no notable dental hx.    Pulmonary neg pulmonary ROS,    Pulmonary exam normal breath sounds clear to auscultation       Cardiovascular Exercise Tolerance: Good hypertension, Pt. on medications negative cardio ROS   Rhythm:regular Rate:Normal     Neuro/Psych negative neurological ROS  negative psych ROS   GI/Hepatic Neg liver ROS, PUD,   Endo/Other  negative endocrine ROSdiabetes, Type 2  Renal/GU ARFRenal disease  negative genitourinary   Musculoskeletal   Abdominal   Peds  Hematology negative hematology ROS (+)   Anesthesia Other Findings   Reproductive/Obstetrics negative OB ROS                             Anesthesia Physical Anesthesia Plan  ASA: II  Anesthesia Plan: General   Post-op Pain Management: GA combined w/ Regional for post-op pain   Induction:   PONV Risk Score and Plan:   Airway Management Planned: Oral ETT and LMA  Additional Equipment:   Intra-op Plan:   Post-operative Plan: Extubation in OR  Informed Consent: I have reviewed the patients History and Physical, chart, labs and discussed the procedure including the risks, benefits and alternatives for the proposed anesthesia with the patient or authorized representative who has indicated his/her understanding and acceptance.     Dental Advisory Given  Plan Discussed with: CRNA, Anesthesiologist and Surgeon  Anesthesia Plan Comments: (Discussed both nerve block for pain relief post-op and GA; including NV, sore throat, dental injury, and pulmonary complications)        Anesthesia Quick  Evaluation

## 2018-11-02 NOTE — H&P (Signed)
[name] is an 43 y.o. female.   Chief Complaint: right carpal tunnel syndrome, right ulnar neuropathy HPI: 43 yo female with numbness and tingling right hand.  Clawing of ring and small fingers.  Positive nerve conduction studies.  She wishes to have carpal tunnel, guyons canal, cubital tunnel releases.  Allergies:  Allergies  Allergen Reactions  . Adhesive [Tape] Other (See Comments)    "plastic tape" irritates skin   Ok with paper tape    Past Medical History:  Diagnosis Date  . Arthritis   . Diabetes mellitus   . HTN (hypertension)   . Pyelonephritis 09/30/11  . Renal failure   . S/P kidney transplant     Past Surgical History:  Procedure Laterality Date  . CESAREAN SECTION  1996  . KIDNEY TRANSPLANT  03/2010   right  . loop av graft  05/2009   left upper arm  . PERITONEAL CATHETER INSERTION  ~ 2003  . TUBAL LIGATION  2000    Family History: Family History  Problem Relation Age of Onset  . Diabetes Mother   . Arthritis Mother   . Arthritis Father   . Prostate cancer Father   . Hypertension Father     Social History:   reports that she has never smoked. She has never used smokeless tobacco. She reports that she does not drink alcohol or use drugs.  Medications: Medications Prior to Admission  Medication Sig Dispense Refill  . amLODipine (NORVASC) 5 MG tablet Take 5 mg by mouth daily.      Marland Kitchen atorvastatin (LIPITOR) 10 MG tablet Take by mouth.    . Cholecalciferol (VITAMIN D3) 125 MCG (5000 UT) CAPS Take by mouth daily.    . Insulin Degludec (TRESIBA FLEXTOUCH) 200 UNIT/ML SOPN Inject 60 Units into the skin at bedtime. 6 pen 2  . levonorgestrel (MIRENA) 20 MCG/24HR IUD by Intrauterine route.    . metFORMIN (GLUCOPHAGE) 500 MG tablet Take by mouth 2 (two) times daily with a meal.    . mycophenolate (CELLCEPT) 250 MG capsule Take 750 mg by mouth every evening.      Marland Kitchen omeprazole (PRILOSEC) 40 MG capsule Take 40 mg by mouth daily.    Marland Kitchen  oxyCODONE-acetaminophen (PERCOCET) 5-325 MG tablet Take 1 tablet by mouth every 4 (four) hours as needed. 10 tablet 0  . predniSONE (DELTASONE) 5 MG tablet Take 5 mg by mouth daily.      . tacrolimus (PROGRAF) 1 MG capsule Take 3 mg by mouth 2 (two) times daily. 2.5 tablets in the am and 3 tablets in the pm    . albuterol (PROVENTIL HFA;VENTOLIN HFA) 108 (90 BASE) MCG/ACT inhaler Inhale 2 puffs into the lungs every 6 (six) hours as needed for wheezing or shortness of breath.    . EPINEPHrine (EPIPEN) 0.3 mg/0.3 mL IJ SOAJ injection Inject 0.3 mLs (0.3 mg total) into the muscle as needed. (Patient not taking: Reported on 10/10/2018) 2 Device 0  . loperamide (IMODIUM A-D) 2 MG tablet Take by mouth.      Results for orders placed or performed during the hospital encounter of 11/02/18 (from the past 48 hour(s))  Glucose, capillary     Status: Abnormal   Collection Time: 11/02/18  8:14 AM  Result Value Ref Range   Glucose-Capillary 170 (H) 70 - 99 mg/dL    No results found.   A comprehensive review of systems was negative.  Blood pressure 129/73, pulse 86, temperature 98 F (36.7 C), temperature source Oral,  resp. rate 18, height 5\' 5"  (1.651 m), weight 102.4 kg, SpO2 99 %.  General appearance: alert, cooperative and appears stated age Head: Normocephalic, without obvious abnormality, atraumatic Neck: supple, symmetrical, trachea midline Cardio: regular rate and rhythm, S1, S2 normal, no murmur, click, rub or gallop Resp: clear to auscultation bilaterally Extremities: decreased sensation right ring and small fingers.  Intact capillary refill all digits.  +epl/fpl/io.  No wounds.  Pulses: 2+ and symmetric Skin: Skin color, texture, turgor normal. No rashes or lesions Neurologic: Grossly normal Incision/Wound: none  Assessment/Plan Right carpal tunnel syndrome and ulnar neuropathy.  Non operative and operative treatment options have been discussed with the patient and patient wishes to  proceed with operative treatment. Risks, benefits, and alternatives of surgery have been discussed and the patient agrees with the plan of care.   Leanora Cover 11/02/2018, 8:31 AM

## 2018-11-02 NOTE — Anesthesia Postprocedure Evaluation (Signed)
Anesthesia Post Note  Patient: [name]  Procedure(s) Performed: RIGHT CARPAL TUNNEL RELEASE (Right ) RIGHT GUYON'S CANAL RELEASE (Right ) ULNAR NERVE DECOMPRESSION (Right )     Patient location during evaluation: PACU Anesthesia Type: General Level of consciousness: awake and alert Pain management: pain level controlled Vital Signs Assessment: post-procedure vital signs reviewed and stable Respiratory status: spontaneous breathing, nonlabored ventilation, respiratory function stable and patient connected to nasal cannula oxygen Cardiovascular status: blood pressure returned to baseline and stable Postop Assessment: no apparent nausea or vomiting Anesthetic complications: no    Last Vitals:  Vitals:   11/02/18 1100 11/02/18 1115  BP: (!) 170/74 (!) 155/72  Pulse: (!) 103 100  Resp: 16 18  Temp:    SpO2: 100% 100%    Last Pain:  Vitals:   11/02/18 1100  TempSrc:   PainSc: 0-No pain                 Narcissus Detwiler

## 2018-11-02 NOTE — Anesthesia Procedure Notes (Signed)
Anesthesia Regional Block: Supraclavicular block   Pre-Anesthetic Checklist: ,, timeout performed, Correct Patient, Correct Site, Correct Laterality, Correct Procedure, Correct Position, site marked, Risks and benefits discussed,  Surgical consent,  Pre-op evaluation,  At surgeon's request and post-op pain management  Laterality: Right  Prep: chloraprep       Needles:  Injection technique: Single-shot  Needle Type: Echogenic Stimulator Needle     Needle Length: 5cm  Needle Gauge: 22     Additional Needles:   Procedures:, nerve stimulator,,, ultrasound used (permanent image in chart),,,,   Nerve Stimulator or Paresthesia:  Response: shoulder, 0.45 mA,   Additional Responses:   Narrative:  Start time: 11/02/2018 8:41 AM End time: 11/02/2018 8:45 AM Injection made incrementally with aspirations every 5 mL.  Performed by: Personally  Anesthesiologist: Janeece Riggers, MD  Additional Notes: Functioning IV was confirmed and monitors were applied.  A 34mm 22ga Arrow echogenic stimulator needle was used. Sterile prep and drape,hand hygiene and sterile gloves were used. Ultrasound guidance: relevant anatomy identified, needle position confirmed, local anesthetic spread visualized around nerve(s)., vascular puncture avoided.  Image printed for medical record. Negative aspiration and negative test dose prior to incremental administration of local anesthetic. The patient tolerated the procedure well.

## 2018-11-03 ENCOUNTER — Encounter (HOSPITAL_BASED_OUTPATIENT_CLINIC_OR_DEPARTMENT_OTHER): Payer: Self-pay | Admitting: Orthopedic Surgery

## 2018-11-08 ENCOUNTER — Ambulatory Visit: Payer: 59 | Admitting: Internal Medicine

## 2018-11-14 ENCOUNTER — Ambulatory Visit (INDEPENDENT_AMBULATORY_CARE_PROVIDER_SITE_OTHER): Payer: 59 | Admitting: Internal Medicine

## 2018-11-14 ENCOUNTER — Encounter: Payer: Self-pay | Admitting: Internal Medicine

## 2018-11-14 VITALS — BP 114/72 | HR 79 | Temp 97.8°F | Ht 64.2 in | Wt 226.6 lb

## 2018-11-14 DIAGNOSIS — Z794 Long term (current) use of insulin: Secondary | ICD-10-CM

## 2018-11-14 DIAGNOSIS — M79673 Pain in unspecified foot: Secondary | ICD-10-CM

## 2018-11-14 DIAGNOSIS — Z1231 Encounter for screening mammogram for malignant neoplasm of breast: Secondary | ICD-10-CM

## 2018-11-14 DIAGNOSIS — Z8781 Personal history of (healed) traumatic fracture: Secondary | ICD-10-CM

## 2018-11-14 DIAGNOSIS — N182 Chronic kidney disease, stage 2 (mild): Secondary | ICD-10-CM

## 2018-11-14 DIAGNOSIS — Z7952 Long term (current) use of systemic steroids: Secondary | ICD-10-CM

## 2018-11-14 DIAGNOSIS — E1122 Type 2 diabetes mellitus with diabetic chronic kidney disease: Secondary | ICD-10-CM

## 2018-11-14 MED ORDER — SEMAGLUTIDE (1 MG/DOSE) 2 MG/1.5ML ~~LOC~~ SOPN: 1.0000 mg | PEN_INJECTOR | SUBCUTANEOUS | 3 refills | Status: DC

## 2018-11-14 NOTE — Patient Instructions (Signed)
Diabetes Mellitus and Exercise Exercising regularly is important for your overall health, especially when you have diabetes (diabetes mellitus). Exercising is not only about losing weight. It has many other health benefits, such as increasing muscle strength and bone density and reducing body fat and stress. This leads to improved fitness, flexibility, and endurance, all of which result in better overall health. Exercise has additional benefits for people with diabetes, including:  Reducing appetite.  Helping to lower and control blood glucose.  Lowering blood pressure.  Helping to control amounts of fatty substances (lipids) in the blood, such as cholesterol and triglycerides.  Helping the body to respond better to insulin (improving insulin sensitivity).  Reducing how much insulin the body needs.  Decreasing the risk for heart disease by: ? Lowering cholesterol and triglyceride levels. ? Increasing the levels of good cholesterol. ? Lowering blood glucose levels. What is my activity plan? Your health care provider or certified diabetes educator can help you make a plan for the type and frequency of exercise (activity plan) that works for you. Make sure that you:  Do at least 150 minutes of moderate-intensity or vigorous-intensity exercise each week. This could be brisk walking, biking, or water aerobics. ? Do stretching and strength exercises, such as yoga or weightlifting, at least 2 times a week. ? Spread out your activity over at least 3 days of the week.  Get some form of physical activity every day. ? Do not go more than 2 days in a row without some kind of physical activity. ? Avoid being inactive for more than 30 minutes at a time. Take frequent breaks to walk or stretch.  Choose a type of exercise or activity that you enjoy, and set realistic goals.  Start slowly, and gradually increase the intensity of your exercise over time. What do I need to know about managing my  diabetes?   Check your blood glucose before and after exercising. ? If your blood glucose is 240 mg/dL (13.3 mmol/L) or higher before you exercise, check your urine for ketones. If you have ketones in your urine, do not exercise until your blood glucose returns to normal. ? If your blood glucose is 100 mg/dL (5.6 mmol/L) or lower, eat a snack containing 15-20 grams of carbohydrate. Check your blood glucose 15 minutes after the snack to make sure that your level is above 100 mg/dL (5.6 mmol/L) before you start your exercise.  Know the symptoms of low blood glucose (hypoglycemia) and how to treat it. Your risk for hypoglycemia increases during and after exercise. Common symptoms of hypoglycemia can include: ? Hunger. ? Anxiety. ? Sweating and feeling clammy. ? Confusion. ? Dizziness or feeling light-headed. ? Increased heart rate or palpitations. ? Blurry vision. ? Tingling or numbness around the mouth, lips, or tongue. ? Tremors or shakes. ? Irritability.  Keep a rapid-acting carbohydrate snack available before, during, and after exercise to help prevent or treat hypoglycemia.  Avoid injecting insulin into areas of the body that are going to be exercised. For example, avoid injecting insulin into: ? The arms, when playing tennis. ? The legs, when jogging.  Keep records of your exercise habits. Doing this can help you and your health care provider adjust your diabetes management plan as needed. Write down: ? Food that you eat before and after you exercise. ? Blood glucose levels before and after you exercise. ? The type and amount of exercise you have done. ? When your insulin is expected to peak, if you use   insulin. Avoid exercising at times when your insulin is peaking.  When you start a new exercise or activity, work with your health care provider to make sure the activity is safe for you, and to adjust your insulin, medicines, or food intake as needed.  Drink plenty of water while  you exercise to prevent dehydration or heat stroke. Drink enough fluid to keep your urine clear or pale yellow. Summary  Exercising regularly is important for your overall health, especially when you have diabetes (diabetes mellitus).  Exercising has many health benefits, such as increasing muscle strength and bone density and reducing body fat and stress.  Your health care provider or certified diabetes educator can help you make a plan for the type and frequency of exercise (activity plan) that works for you.  When you start a new exercise or activity, work with your health care provider to make sure the activity is safe for you, and to adjust your insulin, medicines, or food intake as needed. This information is not intended to replace advice given to you by your health care provider. Make sure you discuss any questions you have with your health care provider. Document Released: 12/11/2003 Document Revised: 03/31/2017 Document Reviewed: 03/01/2016 Elsevier Interactive Patient Education  2019 [employer]  

## 2018-11-15 ENCOUNTER — Ambulatory Visit: Payer: 59 | Admitting: Podiatry

## 2018-11-15 ENCOUNTER — Encounter: Payer: Self-pay | Admitting: Podiatry

## 2018-11-15 ENCOUNTER — Ambulatory Visit (INDEPENDENT_AMBULATORY_CARE_PROVIDER_SITE_OTHER): Payer: 59

## 2018-11-15 ENCOUNTER — Ambulatory Visit (INDEPENDENT_AMBULATORY_CARE_PROVIDER_SITE_OTHER): Payer: 59 | Admitting: Podiatry

## 2018-11-15 DIAGNOSIS — S92354D Nondisplaced fracture of fifth metatarsal bone, right foot, subsequent encounter for fracture with routine healing: Secondary | ICD-10-CM | POA: Diagnosis not present

## 2018-11-15 DIAGNOSIS — S92354A Nondisplaced fracture of fifth metatarsal bone, right foot, initial encounter for closed fracture: Secondary | ICD-10-CM

## 2018-11-15 NOTE — Progress Notes (Signed)
She presents today for follow-up of fracture fifth metatarsal base of the right foot.  States that it really has not gotten too much better.  She continues to wear her cam walker at all times.  Is getting married in April.  Objective: Vital signs are stable alert and oriented x3 much decrease in edema to the right foot.  Has mild tenderness on palpation of the diaphyseal region of the fifth metatarsal of the right foot radiographs taken today demonstrate what appears to be a Jones fracture that is healing.  Assessment: Healing Jones fracture right.  Plan: We will follow-up with her in 1 more month for another set of x-rays.

## 2018-11-18 NOTE — Progress Notes (Signed)
Subjective:     Patient ID: [name] , female    DOB: 25-Jan-1976 , 43 y.o.   MRN: [id number]   Chief Complaint  Patient presents with  . ozempic f/u    HPI  She is here today for f/u Ozempic.  She has not had any issues with the medication. She is now up to 0.5mg  once weekly.     Past Medical History:  Diagnosis Date  . Arthritis   . Diabetes mellitus   . HTN (hypertension)   . Pyelonephritis 09/30/11  . Renal failure   . S/P kidney transplant      Family History  Problem Relation Age of Onset  . Diabetes Mother   . Arthritis Mother   . Arthritis Father   . Prostate cancer Father   . Hypertension Father      Current Outpatient Medications:  .  albuterol (PROVENTIL HFA;VENTOLIN HFA) 108 (90 BASE) MCG/ACT inhaler, Inhale 2 puffs into the lungs every 6 (six) hours as needed for wheezing or shortness of breath., Disp: , Rfl:  .  amLODipine (NORVASC) 5 MG tablet, Take 5 mg by mouth daily.  , Disp: , Rfl:  .  atorvastatin (LIPITOR) 10 MG tablet, Take by mouth., Disp: , Rfl:  .  Cholecalciferol (VITAMIN D3) 125 MCG (5000 UT) CAPS, Take by mouth daily., Disp: , Rfl:  .  HYDROcodone-acetaminophen (NORCO) 5-325 MG tablet, 1-2 tabs po q6 hours prn pain, Disp: 30 tablet, Rfl: 0 .  Insulin Degludec (TRESIBA FLEXTOUCH) 200 UNIT/ML SOPN, Inject 60 Units into the skin at bedtime., Disp: 6 pen, Rfl: 2 .  levonorgestrel (MIRENA) 20 MCG/24HR IUD, by Intrauterine route., Disp: , Rfl:  .  metFORMIN (GLUCOPHAGE) 500 MG tablet, Take by mouth 2 (two) times daily with a meal., Disp: , Rfl:  .  mycophenolate (CELLCEPT) 250 MG capsule, Take 750 mg by mouth every evening.  , Disp: , Rfl:  .  omeprazole (PRILOSEC) 40 MG capsule, Take 40 mg by mouth daily., Disp: , Rfl:  .  predniSONE (DELTASONE) 5 MG tablet, Take 5 mg by mouth daily.  , Disp: , Rfl:  .  tacrolimus (PROGRAF) 1 MG capsule, Take 3 mg by mouth 2 (two) times daily. 2.5 tablets in the am and 3 tablets in the pm, Disp: , Rfl:   .  EPINEPHrine (EPIPEN) 0.3 mg/0.3 mL IJ SOAJ injection, Inject 0.3 mLs (0.3 mg total) into the muscle as needed. (Patient not taking: Reported on 11/14/2018), Disp: 2 Device, Rfl: 0 .  Semaglutide, 1 MG/DOSE, (OZEMPIC, 1 MG/DOSE,) 2 MG/1.5ML SOPN, Inject 1 mg into the skin once a week., Disp: 1 pen, Rfl: 3   Allergies  Allergen Reactions  . Adhesive [Tape] Other (See Comments)    "plastic tape" irritates skin   Ok with paper tape     Review of Systems  Constitutional: Negative.   HENT: Negative.   Respiratory: Negative.   Cardiovascular: Negative.   Gastrointestinal: Negative.   Musculoskeletal: Positive for arthralgias (She is followed by Podiatry for foot pain. she reports recent foot fracture and states she has had several fractures in the past. ).  Neurological: Negative.   Psychiatric/Behavioral: Negative.      Today's Vitals   11/14/18 1540  BP: 114/72  Pulse: 79  Temp: 97.8 F (36.6 C)  TempSrc: Oral  Weight: 226 lb 9.6 oz (102.8 kg)  Height: 5' 4.2" (1.631 m)   Body mass index is 38.65 kg/m.   Objective:  Physical Exam Vitals  signs and nursing note reviewed.  Constitutional:      Appearance: Normal appearance. She is obese.  HENT:     Head: Normocephalic and atraumatic.  Cardiovascular:     Rate and Rhythm: Normal rate and regular rhythm.     Heart sounds: Normal heart sounds.  Pulmonary:     Effort: Pulmonary effort is normal.     Breath sounds: Normal breath sounds.  Skin:    General: Skin is warm.  Neurological:     General: No focal deficit present.     Mental Status: She is alert.  Psychiatric:        Mood and Affect: Mood normal.         Assessment And Plan:     1. Type 2 diabetes mellitus with stage 2 chronic kidney disease, with long-term current use of insulin ([place])  She will continue with Ozempic. She is agreeable to going to higher dose, 1mg  once weekly. She will rto in 6-8 weeks for her next a1c check.   2. Long-term corticosteroid  use  She is on low dose prednisone as part of her immunosuppressant therapy s/p renal transplant.   3. Breast cancer screening by mammogram  She is due for Mammo in July 2020. She is encouraged to perform monthly self breast exams.   4. Frequent fractures of bone  I will refer her for dexa scan. Possibly due to long-term use of steroids.   [name], MD

## 2018-11-19 ENCOUNTER — Encounter: Payer: Self-pay | Admitting: Internal Medicine

## 2018-11-20 LAB — HM DIABETES EYE EXAM

## 2018-12-13 ENCOUNTER — Ambulatory Visit (INDEPENDENT_AMBULATORY_CARE_PROVIDER_SITE_OTHER): Payer: 59

## 2018-12-13 ENCOUNTER — Other Ambulatory Visit: Payer: Self-pay

## 2018-12-13 ENCOUNTER — Ambulatory Visit (INDEPENDENT_AMBULATORY_CARE_PROVIDER_SITE_OTHER): Payer: 59 | Admitting: Podiatry

## 2018-12-13 ENCOUNTER — Encounter: Payer: Self-pay | Admitting: Podiatry

## 2018-12-13 DIAGNOSIS — S92354D Nondisplaced fracture of fifth metatarsal bone, right foot, subsequent encounter for fracture with routine healing: Secondary | ICD-10-CM

## 2018-12-13 NOTE — Progress Notes (Addendum)
She presents today for follow-up of her fracture fifth metatarsal base right foot.  States that is swollen but is feeling better.  She states that she continues to walk with her cam walker.  Objective: Vital signs are stable she is alert and oriented x3.  Fifth metatarsal proximal shaft does demonstrate mild edema in the area and is hard to the touch no ecchymosis.  Radiographs taken today demonstrate a slow healing Jones fracture bony callus is forming.  She still has some lytic areas within the healing diaphyseal region.  Assessment:Slow healing fracture fifth met base.  Plan: Encouraged her to continue the cam walker for another month she gets married in mid April.

## 2019-01-09 ENCOUNTER — Telehealth: Payer: Self-pay | Admitting: Internal Medicine

## 2019-01-09 NOTE — Telephone Encounter (Signed)
patient requested and agreed to have a virtual visit with Dr. Baird Cancer 01/15/19

## 2019-01-10 ENCOUNTER — Ambulatory Visit: Payer: 59 | Admitting: Podiatry

## 2019-01-15 ENCOUNTER — Other Ambulatory Visit: Payer: Self-pay

## 2019-01-15 ENCOUNTER — Encounter: Payer: Self-pay | Admitting: Internal Medicine

## 2019-01-15 ENCOUNTER — Ambulatory Visit (INDEPENDENT_AMBULATORY_CARE_PROVIDER_SITE_OTHER): Payer: 59 | Admitting: Internal Medicine

## 2019-01-15 VITALS — BP 121/85 | HR 88 | Temp 98.6°F | Ht 64.2 in

## 2019-01-15 DIAGNOSIS — Z8781 Personal history of (healed) traumatic fracture: Secondary | ICD-10-CM

## 2019-01-15 DIAGNOSIS — N182 Chronic kidney disease, stage 2 (mild): Secondary | ICD-10-CM

## 2019-01-15 DIAGNOSIS — E1122 Type 2 diabetes mellitus with diabetic chronic kidney disease: Secondary | ICD-10-CM

## 2019-01-15 DIAGNOSIS — I129 Hypertensive chronic kidney disease with stage 1 through stage 4 chronic kidney disease, or unspecified chronic kidney disease: Secondary | ICD-10-CM

## 2019-01-15 DIAGNOSIS — F419 Anxiety disorder, unspecified: Secondary | ICD-10-CM | POA: Diagnosis not present

## 2019-01-15 DIAGNOSIS — Z7189 Other specified counseling: Secondary | ICD-10-CM

## 2019-01-15 DIAGNOSIS — Z794 Long term (current) use of insulin: Secondary | ICD-10-CM

## 2019-01-15 DIAGNOSIS — Z6839 Body mass index (BMI) 39.0-39.9, adult: Secondary | ICD-10-CM

## 2019-01-15 NOTE — Patient Instructions (Signed)
Diabetes Mellitus and Nutrition, Adult  When you have diabetes (diabetes mellitus), it is very important to have healthy eating habits because your blood sugar (glucose) levels are greatly affected by what you eat and drink. Eating healthy foods in the appropriate amounts, at about the same times every day, can help you:  · Control your blood glucose.  · Lower your risk of heart disease.  · Improve your blood pressure.  · Reach or maintain a healthy weight.  Every person with diabetes is different, and each person has different needs for a meal plan. Your health care provider may recommend that you work with a diet and nutrition specialist (dietitian) to make a meal plan that is best for you. Your meal plan may vary depending on factors such as:  · The calories you need.  · The medicines you take.  · Your weight.  · Your blood glucose, blood pressure, and cholesterol levels.  · Your activity level.  · Other health conditions you have, such as heart or kidney disease.  How do carbohydrates affect me?  Carbohydrates, also called carbs, affect your blood glucose level more than any other type of food. Eating carbs naturally raises the amount of glucose in your blood. Carb counting is a method for keeping track of how many carbs you eat. Counting carbs is important to keep your blood glucose at a healthy level, especially if you use insulin or take certain oral diabetes medicines.  It is important to know how many carbs you can safely have in each meal. This is different for every person. Your dietitian can help you calculate how many carbs you should have at each meal and for each snack.  Foods that contain carbs include:  · Bread, cereal, rice, pasta, and crackers.  · Potatoes and corn.  · Peas, beans, and lentils.  · Milk and yogurt.  · Fruit and juice.  · Desserts, such as cakes, cookies, ice cream, and candy.  How does alcohol affect me?  Alcohol can cause a sudden decrease in blood glucose (hypoglycemia),  especially if you use insulin or take certain oral diabetes medicines. Hypoglycemia can be a life-threatening condition. Symptoms of hypoglycemia (sleepiness, dizziness, and confusion) are similar to symptoms of having too much alcohol.  If your health care provider says that alcohol is safe for you, follow these guidelines:  · Limit alcohol intake to no more than 1 drink per day for nonpregnant women and 2 drinks per day for men. One drink equals 12 oz of beer, 5 oz of wine, or 1½ oz of hard liquor.  · Do not drink on an empty stomach.  · Keep yourself hydrated with water, diet soda, or unsweetened iced tea.  · Keep in mind that regular soda, juice, and other mixers may contain a lot of sugar and must be counted as carbs.  What are tips for following this plan?    Reading food labels  · Start by checking the serving size on the "Nutrition Facts" label of packaged foods and drinks. The amount of calories, carbs, fats, and other nutrients listed on the label is based on one serving of the item. Many items contain more than one serving per package.  · Check the total grams (g) of carbs in one serving. You can calculate the number of servings of carbs in one serving by dividing the total carbs by 15. For example, if a food has 30 g of total carbs, it would be equal to 2   servings of carbs.  · Check the number of grams (g) of saturated and trans fats in one serving. Choose foods that have low or no amount of these fats.  · Check the number of milligrams (mg) of salt (sodium) in one serving. Most people should limit total sodium intake to less than 2,300 mg per day.  · Always check the nutrition information of foods labeled as "low-fat" or "nonfat". These foods may be higher in added sugar or refined carbs and should be avoided.  · Talk to your dietitian to identify your daily goals for nutrients listed on the label.  Shopping  · Avoid buying canned, premade, or processed foods. These foods tend to be high in fat, sodium,  and added sugar.  · Shop around the outside edge of the grocery store. This includes fresh fruits and vegetables, bulk grains, fresh meats, and fresh dairy.  Cooking  · Use low-heat cooking methods, such as baking, instead of high-heat cooking methods like deep frying.  · Cook using healthy oils, such as olive, canola, or sunflower oil.  · Avoid cooking with butter, cream, or high-fat meats.  Meal planning  · Eat meals and snacks regularly, preferably at the same times every day. Avoid going long periods of time without eating.  · Eat foods high in fiber, such as fresh fruits, vegetables, beans, and whole grains. Talk to your dietitian about how many servings of carbs you can eat at each meal.  · Eat 4-6 ounces (oz) of lean protein each day, such as lean meat, chicken, fish, eggs, or tofu. One oz of lean protein is equal to:  ? 1 oz of meat, chicken, or fish.  ? 1 egg.  ? ¼ cup of tofu.  · Eat some foods each day that contain healthy fats, such as avocado, nuts, seeds, and fish.  Lifestyle  · Check your blood glucose regularly.  · Exercise regularly as told by your health care provider. This may include:  ? 150 minutes of moderate-intensity or vigorous-intensity exercise each week. This could be brisk walking, biking, or water aerobics.  ? Stretching and doing strength exercises, such as yoga or weightlifting, at least 2 times a week.  · Take medicines as told by your health care provider.  · Do not use any products that contain nicotine or tobacco, such as cigarettes and e-cigarettes. If you need help quitting, ask your health care provider.  · Work with a counselor or diabetes educator to identify strategies to manage stress and any emotional and social challenges.  Questions to ask a health care provider  · Do I need to meet with a diabetes educator?  · Do I need to meet with a dietitian?  · What number can I call if I have questions?  · When are the best times to check my blood glucose?  Where to find more  information:  · American Diabetes Association: diabetes.org  · Academy of Nutrition and Dietetics: www.eatright.org  · National Institute of Diabetes and Digestive and Kidney Diseases (NIH): www.niddk.nih.gov  Summary  · A healthy meal plan will help you control your blood glucose and maintain a healthy lifestyle.  · Working with a diet and nutrition specialist (dietitian) can help you make a meal plan that is best for you.  · Keep in mind that carbohydrates (carbs) and alcohol have immediate effects on your blood glucose levels. It is important to count carbs and to use alcohol carefully.  This information is not intended to   replace advice given to you by your health care provider. Make sure you discuss any questions you have with your health care provider.  Document Released: 06/17/2005 Document Revised: 04/20/2017 Document Reviewed: 10/25/2016  [employer] Interactive Patient Education © 2019 [employer]

## 2019-01-15 NOTE — Progress Notes (Addendum)
Virtual Visit via Video Note   This visit type was conducted due to national recommendations for restrictions regarding the COVID-19 Pandemic (e.g. social distancing).  This format is felt to be most appropriate for this patient at this time.  All issues noted in this document were discussed and addressed.  No physical exam was performed (except for noted visual exam findings with Video Visits).  Please refer to the patient's chart (MyChart message for video visits and phone note for telephone visits) for the patient's consent to telehealth for [facility].  Date:  01/15/2019   ID:  [name], DOB 05/29/1976, MRN [id number]  Patient Location:  Home  Provider location:   Office    Chief Complaint:  Diabetes f/u  History of Present Illness:    [name] is a 43 y.o. female who presents via video conferencing for a telehealth visit today.    The patient does not have symptoms concerning for COVID-19 infection (fever, chills, cough, or new shortness of breath).   She has requested virtual visit today. She was scheduled for dm f/u. Recently seen by nephrologist. States her a1c was 9.2. She still needs a meter.   Diabetes  She presents for her follow-up diabetic visit. She has type 2 diabetes mellitus. Her disease course has been improving. Hypoglycemia symptoms include nervousness/anxiousness (states she is nervous re: Covid-19. she realizes she is high risk. has panic attacks whenever she has to leave the house). Pertinent negatives for diabetes include no blurred vision and no chest pain. Risk factors for coronary artery disease include diabetes mellitus, dyslipidemia, hypertension, sedentary lifestyle and obesity. An ACE inhibitor/angiotensin II receptor blocker is not being taken. Eye exam is not current.  Hypertension  This is a chronic problem. The current episode started more than 1 year ago. The problem is controlled. Pertinent negatives include no blurred vision, chest  pain, palpitations or shortness of breath.   She reports compliance with medications.   Past Medical History:  Diagnosis Date  . Arthritis   . Diabetes mellitus   . HTN (hypertension)   . Pyelonephritis 09/30/11  . Renal failure   . S/P kidney transplant    Past Surgical History:  Procedure Laterality Date  . CARPAL TUNNEL RELEASE Right 11/02/2018   Procedure: RIGHT CARPAL TUNNEL RELEASE;  Surgeon: Leanora Cover, MD;  Location: [place];  Service: Orthopedics;  Laterality: Right;  . CESAREAN SECTION  1996  . KIDNEY TRANSPLANT  03/2010   right  . loop av graft  05/2009   left upper arm  . PERITONEAL CATHETER INSERTION  ~ 2003  . TUBAL LIGATION  2000  . ULNAR NERVE TRANSPOSITION Right 11/02/2018   Procedure: ULNAR NERVE DECOMPRESSION;  Surgeon: Leanora Cover, MD;  Location: [place];  Service: Orthopedics;  Laterality: Right;     Current Meds  Medication Sig  . albuterol (PROVENTIL HFA;VENTOLIN HFA) 108 (90 BASE) MCG/ACT inhaler Inhale 2 puffs into the lungs every 6 (six) hours as needed for wheezing or shortness of breath.  Marland Kitchen amLODipine (NORVASC) 5 MG tablet Take 5 mg by mouth daily.    Marland Kitchen atorvastatin (LIPITOR) 10 MG tablet Take by mouth.  . Cholecalciferol (VITAMIN D3) 125 MCG (5000 UT) CAPS Take by mouth daily.  . Insulin Degludec (TRESIBA FLEXTOUCH) 200 UNIT/ML SOPN Inject 60 Units into the skin at bedtime.  Marland Kitchen levonorgestrel (MIRENA) 20 MCG/24HR IUD by Intrauterine route.  . metFORMIN (GLUCOPHAGE) 500 MG tablet Take by mouth 2 (two) times  daily with a meal.  . mycophenolate (CELLCEPT) 250 MG capsule Take 750 mg by mouth every evening.    Marland Kitchen omeprazole (PRILOSEC) 40 MG capsule Take 40 mg by mouth daily.  . predniSONE (DELTASONE) 5 MG tablet Take 5 mg by mouth daily.    . Semaglutide, 1 MG/DOSE, (OZEMPIC, 1 MG/DOSE,) 2 MG/1.5ML SOPN Inject 1 mg into the skin once a week.  . tacrolimus (PROGRAF) 1 MG capsule Take 3 mg by mouth 2 (two) times daily. 2.5  tablets in the am and 3 tablets in the pm     Allergies:   Adhesive [tape]   Social History   Tobacco Use  . Smoking status: Never Smoker  . Smokeless tobacco: Never Used  Substance Use Topics  . Alcohol use: No  . Drug use: No     Family Hx: The patient's family history includes Arthritis in her father and mother; Diabetes in her mother; Hypertension in her father; Prostate cancer in her father.  ROS:   Please see the history of present illness.    Review of Systems  Constitutional: Negative.   Eyes: Negative for blurred vision.  Respiratory: Negative.  Negative for shortness of breath.   Cardiovascular: Negative.  Negative for chest pain and palpitations.  Gastrointestinal: Negative.   Neurological: Negative.   Psychiatric/Behavioral: The patient is nervous/anxious (states she is nervous re: Covid-19. she realizes she is high risk. has panic attacks whenever she has to leave the house).     All other systems reviewed and are negative.   Labs/Other Tests and Data Reviewed:    Recent Labs: 10/10/2018: ALT 17; BUN 25; Creatinine, Ser 1.17; Potassium 4.4; Sodium 140; TSH 2.240   Recent Lipid Panel No results found for: CHOL, TRIG, HDL, CHOLHDL, LDLCALC, LDLDIRECT  Wt Readings from Last 3 Encounters:  11/14/18 226 lb 9.6 oz (102.8 kg)  11/02/18 225 lb 12 oz (102.4 kg)  10/13/18 222 lb (100.7 kg)     Exam:    Vital Signs:  BP 121/85 (BP Location: Left Arm, Patient Position: Sitting, Cuff Size: Normal) Comment: pt provided  Pulse 88 Comment: pt provided  Temp 98.6 F (37 C) (Oral)   Ht 5' 4.2" (1.631 m)   BMI 38.65 kg/m     Physical Exam  Constitutional: She is oriented to person, place, and time and well-developed, well-nourished, and in no distress.  HENT:  Head: Normocephalic and atraumatic.  Neck: Normal range of motion.  Pulmonary/Chest: Effort normal.  Neurological: She is alert and oriented to person, place, and time.  Psychiatric: Affect normal.   Nursing note and vitals reviewed.   ASSESSMENT & PLAN:     1. Type 2 diabetes mellitus with stage 2 chronic kidney disease, with long-term current use of insulin ([place])  She reports she recently had bloodwork with nephrologist, a1c 9.2. She does not have a meter to check her sugars. She is okay with me leaving one up front for someone to pickup for her. She is encouraged to increase her activity and decrease intake of sugary beverages.   2. Hypertensive nephropathy  Well controlled. She will continue with current meds. She is encouraged to avoid adding salt to her foods.   3. Anxiety  Related to COVID-19. She reports a relative brought her some anxiety supplements to try. She states it is L-theanine, pt advised that this is what I planned to suggest to her.   4. Frequent fractures of bone  Unfortunately, this is still pending. Bone density has been  rescheduled to June secondary to COVID-19 pandemic.  5. Class 2 severe obesity due to excess calories with serious comorbidity and body mass index (BMI) of 39.0 to 39.9 in adult [facility])  Importance of achieving optimal weight to decrease risk of cardiovascular disease and cancers was discussed with the patient in full detail. She is encouraged to start slowly - start with 10 minutes twice daily at least three to four days per week and to gradually build to 30 minutes five days weekly. She was given tips to incorporate more activity into her daily routine - take stairs when possible, park farther away from grocery stores, etc.        COVID-19 Education: The signs and symptoms of COVID-19 were discussed with the patient and how to seek care for testing (follow up with PCP or arrange E-visit).  The importance of social distancing was discussed today.  Patient Risk:   After full review of this patients clinical status, I feel that they are at least moderate risk at this time.  Time:   Today, I have spent 13 minutes 36 seconds with the patient  with telehealth technology discussing above diagnoses     Medication Adjustments/Labs and Tests Ordered: Current medicines are reviewed at length with the patient today.  Concerns regarding medicines are outlined above.  Tests Ordered: No orders of the defined types were placed in this encounter.  Medication Changes: No orders of the defined types were placed in this encounter.   Disposition:  Follow up in 3 month(s)  Signed, [name], MD

## 2019-01-16 ENCOUNTER — Ambulatory Visit: Payer: 59

## 2019-01-16 ENCOUNTER — Other Ambulatory Visit: Payer: 59

## 2019-01-25 ENCOUNTER — Other Ambulatory Visit: Payer: Self-pay

## 2019-01-25 ENCOUNTER — Encounter: Payer: Self-pay | Admitting: Internal Medicine

## 2019-01-25 ENCOUNTER — Encounter: Payer: Self-pay | Admitting: Podiatry

## 2019-01-25 MED ORDER — ACCU-CHEK FASTCLIX LANCETS MISC: 11 refills | Status: DC

## 2019-01-25 MED ORDER — GLUCOSE BLOOD VI STRP: ORAL_STRIP | 11 refills | Status: DC

## 2019-01-25 MED ORDER — ACCU-CHEK GUIDE ME W/DEVICE KIT: 1.0000 | PACK | Freq: Four times a day (QID) | 1 refills | Status: DC

## 2019-01-25 MED ORDER — ONETOUCH DELICA LANCETS 33G MISC: 11 refills | Status: DC

## 2019-01-29 ENCOUNTER — Ambulatory Visit: Payer: 59 | Admitting: Podiatry

## 2019-02-02 ENCOUNTER — Encounter: Payer: Self-pay | Admitting: Internal Medicine

## 2019-02-07 ENCOUNTER — Ambulatory Visit: Payer: 59 | Admitting: Podiatry

## 2019-02-13 ENCOUNTER — Telehealth: Payer: Self-pay | Admitting: *Deleted

## 2019-02-13 ENCOUNTER — Encounter: Payer: Self-pay | Admitting: Podiatry

## 2019-02-13 ENCOUNTER — Ambulatory Visit (INDEPENDENT_AMBULATORY_CARE_PROVIDER_SITE_OTHER): Payer: 59 | Admitting: Podiatry

## 2019-02-13 ENCOUNTER — Ambulatory Visit (INDEPENDENT_AMBULATORY_CARE_PROVIDER_SITE_OTHER): Payer: 59

## 2019-02-13 ENCOUNTER — Other Ambulatory Visit: Payer: Self-pay

## 2019-02-13 VITALS — Temp 98.1°F

## 2019-02-13 DIAGNOSIS — S92354K Nondisplaced fracture of fifth metatarsal bone, right foot, subsequent encounter for fracture with nonunion: Secondary | ICD-10-CM | POA: Diagnosis not present

## 2019-02-13 DIAGNOSIS — S92354D Nondisplaced fracture of fifth metatarsal bone, right foot, subsequent encounter for fracture with routine healing: Secondary | ICD-10-CM

## 2019-02-13 DIAGNOSIS — S92344A Nondisplaced fracture of fourth metatarsal bone, right foot, initial encounter for closed fracture: Secondary | ICD-10-CM | POA: Diagnosis not present

## 2019-02-13 NOTE — Telephone Encounter (Signed)
Dr. Milinda Pointer request Exogen Bone Stimulator for pt.

## 2019-02-13 NOTE — Telephone Encounter (Signed)
2-4 mm.  I addended note.

## 2019-02-14 ENCOUNTER — Encounter: Payer: Self-pay | Admitting: Podiatry

## 2019-02-14 NOTE — Progress Notes (Signed)
She presents today for follow-up of her fifth metatarsal fracture of the right foot.  She states that is just gotten to the point where it really just does not hurt anymore but states the neuropathy is present.  She states that she was tired of wearing the boot so she started wearing regular shoes.  She is noticed more swelling recently.  Objective: Pulses are palpable right moderately swollen foot right mild tenderness on palpation of the fourth and fifth metatarsal bases.  Radiographs taken today demonstrate a 2 to 4 mm diastases depending on the areas measured which looks much worse now along with fifth metatarsal base than previous.  Considerable areas of lysis there is some bony callus formation but no consolidation there is also now a fracture of the fourth metatarsal base.  Assessment at this point is considered a nonunion and nonhealing fracture fifth met base.  Fracture minimally displaced fourth met base.  Plan: At this point she presented in tennis shoes today and I encouraged her to get back into her cam walker or use a knee scooter at all times.  We will request an EXOGEN bone stimulator.

## 2019-02-14 NOTE — Telephone Encounter (Signed)
Faxed Dr. Stephenie Acres clinicals, demographics and Exogen rx for bone growth stimulator to [facility].

## 2019-02-15 ENCOUNTER — Telehealth: Payer: Self-pay | Admitting: *Deleted

## 2019-02-15 NOTE — Telephone Encounter (Signed)
[name] please add this and let valery know.

## 2019-02-15 NOTE — Telephone Encounter (Signed)
Dx added

## 2019-02-15 NOTE — Telephone Encounter (Signed)
Exogen - [name] states needs updated information for 02/13/2019 clinicals.

## 2019-02-16 NOTE — Telephone Encounter (Signed)
Faxed 02/13/2019 clinicals to [facility].

## 2019-02-16 NOTE — Telephone Encounter (Signed)
Faxed requested clinicals to [facility] [place].

## 2019-03-02 ENCOUNTER — Encounter: Payer: Self-pay | Admitting: Podiatry

## 2019-03-06 ENCOUNTER — Telehealth: Payer: Self-pay | Admitting: *Deleted

## 2019-03-06 NOTE — Telephone Encounter (Signed)
Pt called states she is having pain in the right foot and now is beginning to have pain in the left foot.

## 2019-03-06 NOTE — Telephone Encounter (Signed)
I called pt and told her if Dr. Milinda Pointer has not evaluated the left foot we needed to get her in to evaluated. Pt agreed and I transferred to scheduler and told them to transfer her back me for instructions. Pt is scheduled for 03/08/2019 with Dr. Milinda Pointer and I instructed her to go in to a stiff bottom shoe, it will decrease movement in the foot, and to ice 3-4 times daily 15 minutes/session protect with a light cloth and to rest, ice and elevate until seen.

## 2019-03-08 ENCOUNTER — Encounter: Payer: Self-pay | Admitting: Podiatry

## 2019-03-08 ENCOUNTER — Ambulatory Visit (INDEPENDENT_AMBULATORY_CARE_PROVIDER_SITE_OTHER): Payer: 59 | Admitting: Podiatry

## 2019-03-08 ENCOUNTER — Other Ambulatory Visit: Payer: Self-pay

## 2019-03-08 ENCOUNTER — Ambulatory Visit (INDEPENDENT_AMBULATORY_CARE_PROVIDER_SITE_OTHER): Payer: 59

## 2019-03-08 VITALS — Temp 97.3°F

## 2019-03-08 DIAGNOSIS — S92354K Nondisplaced fracture of fifth metatarsal bone, right foot, subsequent encounter for fracture with nonunion: Secondary | ICD-10-CM

## 2019-03-08 DIAGNOSIS — M778 Other enthesopathies, not elsewhere classified: Secondary | ICD-10-CM

## 2019-03-08 DIAGNOSIS — M779 Enthesopathy, unspecified: Secondary | ICD-10-CM

## 2019-03-08 DIAGNOSIS — S92354D Nondisplaced fracture of fifth metatarsal bone, right foot, subsequent encounter for fracture with routine healing: Secondary | ICD-10-CM

## 2019-03-08 DIAGNOSIS — G5762 Lesion of plantar nerve, left lower limb: Secondary | ICD-10-CM | POA: Diagnosis not present

## 2019-03-08 DIAGNOSIS — G5782 Other specified mononeuropathies of left lower limb: Secondary | ICD-10-CM

## 2019-03-08 NOTE — Progress Notes (Signed)
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  Newly diagnosed fourth metatarsal base fracture last visit.  She is also concerned about pain to her lesser toes left foot today.  Denies any trauma.  States that she has been using her new bone stimulator overlying the fourth and fifth metatarsal bases of the right foot now for about the past 8 days.  Objective: Vital signs are stable she alert oriented x3 there is no erythema edema cellulitis drainage or odor to either foot.  No open lesions or wounds.  Hammertoe deformities of the left foot do demonstrate flexibility but they also demonstrate pain on palpation to the third interdigital space of the left foot.  There is a palpable Mulder's click consistent with neuroma.  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  No fractures left.  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  And fourth metatarsal base fracture.  Plan: Continue use of the bone stimulator right foot after sterile Betadine skin prep I injected 20 mg of Kenalog 5 mg Marcaine with local anesthetic to the third interdigital space.  She tolerated procedure well.  I will follow-up with her in the near future for reevaluation x-rays will be taken of the right foot next visit.

## 2019-03-22 ENCOUNTER — Other Ambulatory Visit: Payer: Self-pay

## 2019-03-22 ENCOUNTER — Encounter: Payer: Self-pay | Admitting: Internal Medicine

## 2019-03-22 MED ORDER — TRESIBA FLEXTOUCH 200 UNIT/ML ~~LOC~~ SOPN: 60.0000 [IU] | PEN_INJECTOR | Freq: Every day | SUBCUTANEOUS | 2 refills | Status: DC

## 2019-03-27 ENCOUNTER — Ambulatory Visit: Payer: 59 | Admitting: Podiatry

## 2019-03-28 ENCOUNTER — Other Ambulatory Visit: Payer: Self-pay

## 2019-03-28 ENCOUNTER — Ambulatory Visit
Admission: RE | Admit: 2019-03-28 | Discharge: 2019-03-28 | Disposition: A | Discharge status: Home / Self Care | Payer: 59 | Source: Ambulatory Visit | Attending: Internal Medicine | Admitting: Internal Medicine

## 2019-03-28 DIAGNOSIS — Z7952 Long term (current) use of systemic steroids: Secondary | ICD-10-CM

## 2019-03-28 DIAGNOSIS — Z1231 Encounter for screening mammogram for malignant neoplasm of breast: Secondary | ICD-10-CM

## 2019-03-28 DIAGNOSIS — Z8781 Personal history of (healed) traumatic fracture: Secondary | ICD-10-CM

## 2019-03-29 ENCOUNTER — Encounter: Payer: Self-pay | Admitting: Internal Medicine

## 2019-04-09 ENCOUNTER — Encounter: Payer: Self-pay | Admitting: Internal Medicine

## 2019-04-10 ENCOUNTER — Other Ambulatory Visit: Payer: Self-pay

## 2019-04-10 MED ORDER — INSULIN PEN NEEDLE 31G X 5 MM MISC: 2 refills | Status: DC

## 2019-04-18 ENCOUNTER — Ambulatory Visit (INDEPENDENT_AMBULATORY_CARE_PROVIDER_SITE_OTHER): Payer: 59

## 2019-04-18 ENCOUNTER — Other Ambulatory Visit: Payer: Self-pay

## 2019-04-18 ENCOUNTER — Encounter: Payer: Self-pay | Admitting: Podiatry

## 2019-04-18 ENCOUNTER — Ambulatory Visit (INDEPENDENT_AMBULATORY_CARE_PROVIDER_SITE_OTHER): Payer: 59 | Admitting: Podiatry

## 2019-04-18 VITALS — Temp 98.0°F

## 2019-04-18 DIAGNOSIS — S92354D Nondisplaced fracture of fifth metatarsal bone, right foot, subsequent encounter for fracture with routine healing: Secondary | ICD-10-CM

## 2019-04-18 NOTE — Progress Notes (Signed)
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  States that her neuroma left foot is doing much better.  Objective: Vital signs are stable alert and oriented x3.  Pulses are palpable.  Decrease in edema overlying the right foot.  No pain on palpation third interdigital space left foot.  Radiographs taken today demonstrate hypertrophic nonunion appears to be healing slowly.  Assessment: Slowly healing fracture fourth and fifth met bases right.  Plan: Continue use of her bone stimulator follow-up with her in 6 to 8 weeks for final set of x-rays.  I am going to allow her to try to get into loosefitting shoes.

## 2019-04-19 ENCOUNTER — Ambulatory Visit: Payer: 59 | Admitting: Internal Medicine

## 2019-05-01 ENCOUNTER — Encounter: Payer: Self-pay | Admitting: Internal Medicine

## 2019-05-08 ENCOUNTER — Ambulatory Visit (INDEPENDENT_AMBULATORY_CARE_PROVIDER_SITE_OTHER): Payer: 59 | Admitting: Internal Medicine

## 2019-05-08 ENCOUNTER — Other Ambulatory Visit: Payer: Self-pay

## 2019-05-08 ENCOUNTER — Encounter: Payer: Self-pay | Admitting: Internal Medicine

## 2019-05-08 VITALS — BP 110/62 | HR 83 | Temp 98.1°F | Ht 64.2 in | Wt 209.4 lb

## 2019-05-08 DIAGNOSIS — Z6835 Body mass index (BMI) 35.0-35.9, adult: Secondary | ICD-10-CM

## 2019-05-08 DIAGNOSIS — N182 Chronic kidney disease, stage 2 (mild): Secondary | ICD-10-CM

## 2019-05-08 DIAGNOSIS — E66812 Obesity, class 2: Secondary | ICD-10-CM

## 2019-05-08 DIAGNOSIS — E1122 Type 2 diabetes mellitus with diabetic chronic kidney disease: Secondary | ICD-10-CM | POA: Diagnosis not present

## 2019-05-08 DIAGNOSIS — Z794 Long term (current) use of insulin: Secondary | ICD-10-CM

## 2019-05-08 DIAGNOSIS — I129 Hypertensive chronic kidney disease with stage 1 through stage 4 chronic kidney disease, or unspecified chronic kidney disease: Secondary | ICD-10-CM

## 2019-05-08 DIAGNOSIS — Z23 Encounter for immunization: Secondary | ICD-10-CM | POA: Diagnosis not present

## 2019-05-08 MED ORDER — TETANUS-DIPHTH-ACELL PERTUSSIS 5-2.5-18.5 LF-MCG/0.5 IM SUSP
0.5000 mL | Freq: Once | INTRAMUSCULAR | Status: AC
  Administered 2019-05-08: 0.5 mL via INTRAMUSCULAR

## 2019-05-09 LAB — CBC
Hematocrit: 39.6 % (ref 34.0–46.6)
Hemoglobin: 12.7 g/dL (ref 11.1–15.9)
MCH: 28.9 pg (ref 26.6–33.0)
MCHC: 32.1 g/dL (ref 31.5–35.7)
MCV: 90 fL (ref 79–97)
Platelets: 270 10*3/uL (ref 150–450)
RBC: 4.4 x10E6/uL (ref 3.77–5.28)
RDW: 12.6 % (ref 11.7–15.4)
WBC: 6.3 10*3/uL (ref 3.4–10.8)

## 2019-05-09 LAB — HEMOGLOBIN A1C
Est. average glucose Bld gHb Est-mCnc: 232 mg/dL
Hgb A1c MFr Bld: 9.7 % — ABNORMAL HIGH (ref 4.8–5.6)

## 2019-05-09 LAB — BMP8+EGFR
BUN/Creatinine Ratio: 13 (ref 9–23)
BUN: 16 mg/dL (ref 6–24)
CO2: 21 mmol/L (ref 20–29)
Calcium: 9.4 mg/dL (ref 8.7–10.2)
Chloride: 102 mmol/L (ref 96–106)
Creatinine, Ser: 1.26 mg/dL — ABNORMAL HIGH (ref 0.57–1.00)
GFR calc Af Amer: 60 mL/min/{1.73_m2} (ref >59)
GFR calc non Af Amer: 52 mL/min/{1.73_m2} — ABNORMAL LOW (ref >59)
Glucose: 298 mg/dL — ABNORMAL HIGH (ref 65–99)
Potassium: 4.5 mmol/L (ref 3.5–5.2)
Sodium: 139 mmol/L (ref 134–144)

## 2019-05-15 ENCOUNTER — Other Ambulatory Visit: Payer: Self-pay

## 2019-05-15 ENCOUNTER — Encounter: Payer: Self-pay | Admitting: Internal Medicine

## 2019-05-15 MED ORDER — OZEMPIC (1 MG/DOSE) 2 MG/1.5ML ~~LOC~~ SOPN: 1.0000 mg | PEN_INJECTOR | SUBCUTANEOUS | 1 refills | Status: DC

## 2019-05-15 MED ORDER — FREESTYLE LIBRE 14 DAY SENSOR MISC: 1.0000 | Freq: Four times a day (QID) | 2 refills | Status: DC

## 2019-05-15 MED ORDER — FREESTYLE LIBRE 14 DAY READER DEVI: 1.0000 | Freq: Four times a day (QID) | 1 refills | Status: DC

## 2019-05-16 NOTE — Progress Notes (Signed)
Subjective:     Patient ID: [name] , female    DOB: 1976/09/25 , 43 y.o.   MRN: [id number]   Chief Complaint  Patient presents with  . Diabetes  . Hypertension    HPI  Diabetes She presents for her follow-up diabetic visit. She has type 2 diabetes mellitus. Her disease course has been improving. Hypoglycemia symptoms include nervousness/anxiousness (states she is nervous re: Covid-19. she realizes she is high risk. has panic attacks whenever she has to leave the house). Pertinent negatives for diabetes include no blurred vision and no chest pain. Risk factors for coronary artery disease include diabetes mellitus, dyslipidemia, hypertension, sedentary lifestyle and obesity. An ACE inhibitor/angiotensin II receptor blocker is not being taken. Eye exam is not current.  Hypertension This is a chronic problem. The current episode started more than 1 year ago. The problem is controlled. Pertinent negatives include no blurred vision, chest pain, palpitations or shortness of breath. Risk factors for coronary artery disease include dyslipidemia, obesity and sedentary lifestyle. The current treatment provides moderate improvement.     Past Medical History:  Diagnosis Date  . Arthritis   . Diabetes mellitus   . HTN (hypertension)   . Pyelonephritis 09/30/11  . Renal failure   . S/P kidney transplant      Family History  Problem Relation Age of Onset  . Diabetes Mother   . Arthritis Mother   . Arthritis Father   . Prostate cancer Father   . Hypertension Father   . Breast cancer Maternal Aunt   . Breast cancer Paternal Aunt      Current Outpatient Medications:  .  Accu-Chek FastClix Lancets MISC, Use as directed to check blood sugars 4 times per day dx: e11.22, Disp: 200 each, Rfl: 11 .  amLODipine (NORVASC) 5 MG tablet, Take 5 mg by mouth daily.  , Disp: , Rfl:  .  atorvastatin (LIPITOR) 10 MG tablet, Take by mouth., Disp: , Rfl:  .  Blood Glucose Monitoring Suppl  (ACCU-CHEK GUIDE ME) w/Device KIT, 1 kit by Does not apply route QID. Dx: E11.22, Disp: 1 kit, Rfl: 1 .  Cholecalciferol (VITAMIN D3) 125 MCG (5000 UT) CAPS, Take by mouth daily., Disp: , Rfl:  .  gabapentin (NEURONTIN) 100 MG capsule, , Disp: , Rfl:  .  glucose blood (ACCU-CHEK GUIDE) test strip, Use as instructed to check blood sugars 4 times per day dx: e11.22, Disp: 200 each, Rfl: 11 .  Insulin Degludec (TRESIBA FLEXTOUCH) 200 UNIT/ML SOPN, Inject 60 Units into the skin at bedtime., Disp: 18 pen, Rfl: 2 .  Insulin Pen Needle 31G X 5 MM MISC, Use as directed with insulin pen, Disp: 100 each, Rfl: 2 .  levonorgestrel (MIRENA) 20 MCG/24HR IUD, by Intrauterine route., Disp: , Rfl:  .  metFORMIN (GLUCOPHAGE) 500 MG tablet, Take by mouth 2 (two) times daily with a meal., Disp: , Rfl:  .  mycophenolate (CELLCEPT) 250 MG capsule, Take 750 mg by mouth every evening.  , Disp: , Rfl:  .  pantoprazole (PROTONIX) 40 MG tablet, , Disp: , Rfl:  .  predniSONE (DELTASONE) 5 MG tablet, Take 5 mg by mouth daily.  , Disp: , Rfl:  .  tacrolimus (PROGRAF) 0.5 MG capsule, , Disp: , Rfl:  .  Continuous Blood Gluc Receiver (FREESTYLE LIBRE 14 DAY READER) DEVI, Inject 1 each into the skin QID., Disp: 1 each, Rfl: 1 .  Continuous Blood Gluc Sensor (FREESTYLE LIBRE 14 DAY SENSOR) MISC, 1 each  by Does not apply route 4 (four) times daily., Disp: 2 each, Rfl: 2 .  Semaglutide, 1 MG/DOSE, (OZEMPIC, 1 MG/DOSE,) 2 MG/1.5ML SOPN, Inject 1 mg into the skin once a week., Disp: 6 pen, Rfl: 1   Allergies  Allergen Reactions  . Adhesive [Tape] Other (See Comments)    "plastic tape" irritates skin   Ok with paper tape     Review of Systems  Constitutional: Negative.   Eyes: Negative for blurred vision.  Respiratory: Negative.  Negative for shortness of breath.   Cardiovascular: Negative.  Negative for chest pain and palpitations.  Gastrointestinal: Negative.   Neurological: Negative.   Psychiatric/Behavioral: The patient  is nervous/anxious (states she is nervous re: Covid-19. she realizes she is high risk. has panic attacks whenever she has to leave the house).      Today's Vitals   05/08/19 1544  BP: 110/62  Pulse: 83  Temp: 98.1 F (36.7 C)  TempSrc: Oral  Weight: 209 lb 6.4 oz (95 kg)  Height: 5' 4.2" (1.631 m)   Body mass index is 35.72 kg/m.   Objective:  Physical Exam Vitals signs and nursing note reviewed.  Constitutional:      Appearance: Normal appearance.  HENT:     Head: Normocephalic and atraumatic.  Cardiovascular:     Rate and Rhythm: Normal rate and regular rhythm.     Heart sounds: Normal heart sounds.  Pulmonary:     Effort: Pulmonary effort is normal.     Breath sounds: Normal breath sounds.  Skin:    General: Skin is warm.  Neurological:     General: No focal deficit present.     Mental Status: She is alert.  Psychiatric:        Mood and Affect: Mood normal.        Behavior: Behavior normal.         Assessment And Plan:     1. Type 2 diabetes mellitus with stage 2 chronic kidney disease, with long-term current use of insulin ([place])  I will check labs as listed below. Importance of dietary compliance was discussed with the patient. She is encouraged to incorporate more exercise into her daily routine.   - BMP8+EGFR - Hemoglobin A1c - CBC no Diff  2. Hypertensive nephropathy  Chronic, well controlled. She will continue with current meds. She is encouraged to avoid adding salt to her foods.   3. Need for vaccination  - Tdap (BOOSTRIX) injection 0.5 mL  4. Class 2 severe obesity due to excess calories with serious comorbidity and body mass index (BMI) of 35.0 to 35.9 in adult [facility])  Importance of achieving optimal weight to decrease risk of cardiovascular disease and cancers was discussed with the patient in full detail. Importance of regular exercise was discussed with the patient. She is encouraged to start slowly - start with 10 minutes twice daily at  least three to four days per week and to gradually build to 30 minutes five days weekly. She was given tips to incorporate more activity into her daily routine - take stairs when possible, park farther away from her job, grocery stores, etc.    [name], MD    THE PATIENT IS ENCOURAGED TO PRACTICE SOCIAL DISTANCING DUE TO THE COVID-19 PANDEMIC.

## 2019-05-30 ENCOUNTER — Other Ambulatory Visit: Payer: Self-pay

## 2019-05-30 ENCOUNTER — Encounter: Payer: Self-pay | Admitting: Podiatry

## 2019-05-30 ENCOUNTER — Ambulatory Visit (INDEPENDENT_AMBULATORY_CARE_PROVIDER_SITE_OTHER): Payer: 59

## 2019-05-30 ENCOUNTER — Ambulatory Visit (INDEPENDENT_AMBULATORY_CARE_PROVIDER_SITE_OTHER): Payer: 59 | Admitting: Podiatry

## 2019-05-30 DIAGNOSIS — S92354D Nondisplaced fracture of fifth metatarsal bone, right foot, subsequent encounter for fracture with routine healing: Secondary | ICD-10-CM | POA: Diagnosis not present

## 2019-05-30 NOTE — Progress Notes (Signed)
She presents today for follow-up of her fracture fourth and fifth metatarsal bases of the right foot states that is doing great feels about normal now I have been using my bone stimulator regularly.  Objective: Vital signs are stable she is alert and oriented x3 she comes into the office with a pair of sandals on.  Pulses are palpable.  There is no erythema edema cellulitis drainage or odor she has no pain on palpation of the fourth and fifth met bases.  Radiographs taken today demonstrate a well-healing fifth and fourth metatarsal base fracture good alignment no comminution no acute findings.  Assessment: Well-healing fourth fifth met base fracture right foot.  Plan: Follow-up with her in 6 months for her diabetic checkup.

## 2019-07-09 ENCOUNTER — Encounter: Payer: Self-pay | Admitting: Internal Medicine

## 2019-07-12 ENCOUNTER — Encounter: Payer: Self-pay | Admitting: Internal Medicine

## 2019-07-16 ENCOUNTER — Other Ambulatory Visit: Payer: Self-pay

## 2019-07-16 ENCOUNTER — Encounter: Payer: Self-pay | Admitting: Internal Medicine

## 2019-07-16 MED ORDER — INSULIN PEN NEEDLE 31G X 5 MM MISC: 2 refills | Status: DC

## 2019-07-17 ENCOUNTER — Other Ambulatory Visit: Payer: Self-pay

## 2019-07-21 ENCOUNTER — Other Ambulatory Visit: Payer: Self-pay | Admitting: Internal Medicine

## 2019-07-21 DIAGNOSIS — E1122 Type 2 diabetes mellitus with diabetic chronic kidney disease: Secondary | ICD-10-CM

## 2019-07-21 DIAGNOSIS — N182 Chronic kidney disease, stage 2 (mild): Secondary | ICD-10-CM

## 2019-07-23 ENCOUNTER — Encounter: Payer: Self-pay | Admitting: Internal Medicine

## 2019-07-26 ENCOUNTER — Encounter: Payer: Self-pay | Admitting: Internal Medicine

## 2019-07-26 ENCOUNTER — Other Ambulatory Visit: Payer: Self-pay

## 2019-07-26 MED ORDER — FREESTYLE LIBRE 14 DAY SENSOR MISC: 1.0000 | Freq: Four times a day (QID) | 2 refills | Status: DC

## 2019-07-26 MED ORDER — FREESTYLE LIBRE 14 DAY READER DEVI: 1.0000 | Freq: Four times a day (QID) | 1 refills | Status: DC

## 2019-07-31 ENCOUNTER — Encounter: Payer: Self-pay | Admitting: Internal Medicine

## 2019-08-02 ENCOUNTER — Ambulatory Visit (INDEPENDENT_AMBULATORY_CARE_PROVIDER_SITE_OTHER): Payer: 59 | Admitting: Internal Medicine

## 2019-08-02 ENCOUNTER — Encounter: Payer: Self-pay | Admitting: Internal Medicine

## 2019-08-02 ENCOUNTER — Other Ambulatory Visit: Payer: Self-pay

## 2019-08-02 VITALS — BP 102/64 | HR 73 | Ht 64.0 in | Wt 203.4 lb

## 2019-08-02 DIAGNOSIS — E1122 Type 2 diabetes mellitus with diabetic chronic kidney disease: Secondary | ICD-10-CM | POA: Diagnosis not present

## 2019-08-02 DIAGNOSIS — Z794 Long term (current) use of insulin: Secondary | ICD-10-CM

## 2019-08-02 DIAGNOSIS — N182 Chronic kidney disease, stage 2 (mild): Secondary | ICD-10-CM | POA: Diagnosis not present

## 2019-08-02 LAB — POCT GLYCOSYLATED HEMOGLOBIN (HGB A1C): Hemoglobin A1C: 7.6 % — AB (ref 4.0–5.6)

## 2019-08-02 MED ORDER — GLUCAGON 3 MG/DOSE NA POWD: 3.0000 mg | Freq: Once | NASAL | 11 refills | Status: DC | PRN

## 2019-08-02 MED ORDER — GLIPIZIDE 5 MG PO TABS: 5.0000 mg | ORAL_TABLET | Freq: Every day | ORAL | 3 refills | Status: DC

## 2019-08-02 MED ORDER — FREESTYLE LIBRE 2 SENSOR SYSTM MISC: 1.0000 | 3 refills | Status: DC

## 2019-08-02 MED ORDER — FREESTYLE LIBRE 2 READER SYSTM DEVI: 1.0000 | Freq: Once | 0 refills | Status: DC

## 2019-08-02 MED ORDER — TRESIBA FLEXTOUCH 200 UNIT/ML ~~LOC~~ SOPN: 54.0000 [IU] | PEN_INJECTOR | Freq: Every day | SUBCUTANEOUS | 2 refills | Status: DC

## 2019-08-02 NOTE — Patient Instructions (Addendum)
Please continue: - Metformin 500 mg 2x a day, with meals - Ozempic 1 mg weekly  Reduce: - Tresiba 54 units daily  Add: - Glipizide 5-10 mg 30 min before dinner  Add veggies to dinner!  Try to switch to a Freestyle Libre 2 CGM - which has alarms!  Let me know if you cannot pick up the inhaled glucagon.  Please return in 3 months.  PATIENT INSTRUCTIONS FOR TYPE 2 DIABETES:  DIET AND EXERCISE Diet and exercise is an important part of diabetic treatment.  We recommended aerobic exercise in the form of brisk walking (working between 40-60% of maximal aerobic capacity, similar to brisk walking) for 150 minutes per week (such as 30 minutes five days per week) along with 3 times per week performing 'resistance' training (using various gauge rubber tubes with handles) 5-10 exercises involving the major muscle groups (upper body, lower body and core) performing 10-15 repetitions (or near fatigue) each exercise. Start at half the above goal but build slowly to reach the above goals. If limited by weight, joint pain, or disability, we recommend daily walking in a swimming pool with water up to waist to reduce pressure from joints while allow for adequate exercise.    BLOOD GLUCOSES Monitoring your blood glucoses is important for continued management of your diabetes. Please check your blood glucoses 2-4 times a day: fasting, before meals and at bedtime (you can rotate these measurements - e.g. one day check before the 3 meals, the next day check before 2 of the meals and before bedtime, etc.).   HYPOGLYCEMIA (low blood sugar) Hypoglycemia is usually a reaction to not eating, exercising, or taking too much insulin/ other diabetes drugs.  Symptoms include tremors, sweating, hunger, confusion, headache, etc. Treat IMMEDIATELY with 15 grams of Carbs: . 4 glucose tablets .  cup regular juice/soda . 2 tablespoons raisins . 4 teaspoons sugar . 1 tablespoon honey Recheck blood glucose in 15 mins and  repeat above if still symptomatic/blood glucose <100.  RECOMMENDATIONS TO REDUCE YOUR RISK OF DIABETIC COMPLICATIONS: * Take your prescribed MEDICATION(S) * Follow a DIABETIC diet: Complex carbs, fiber rich foods, (monounsaturated and polyunsaturated) fats * AVOID saturated/trans fats, high fat foods, >2,300 mg salt per day. * EXERCISE at least 5 times a week for 30 minutes or preferably daily.  * DO NOT SMOKE OR DRINK more than 1 drink a day. * Check your FEET every day. Do not wear tightfitting shoes. Contact us if you develop an ulcer * See your EYE doctor once a year or more if needed * Get a FLU shot once a year * Get a PNEUMONIA vaccine once before and once after age 64 years  GOALS:  * Your Hemoglobin A1c of <7%  * fasting sugars need to be <130 * after meals sugars need to be <180 (2h after you start eating) * Your Systolic BP should be 163 or lower  * Your Diastolic BP should be 80 or lower  * Your HDL (Good Cholesterol) should be 40 or higher  * Your LDL (Bad Cholesterol) should be 100 or lower. * Your Triglycerides should be 150 or lower  * Your Urine microalbumin (kidney function) should be <30 * Your Body Mass Index should be 25 or lower    Please consider the following ways to cut down carbs and fat and increase fiber and micronutrients in your diet: - substitute whole grain for white bread or pasta - substitute brown rice for white rice - substitute 90-calorie  flat bread pieces for slices of bread when possible - substitute sweet potatoes or yams for white potatoes - substitute humus for margarine - substitute tofu for cheese when possible - substitute almond or rice milk for regular milk (would not drink soy milk daily due to concern for soy estrogen influence on breast cancer risk) - substitute dark chocolate for other sweets when possible - substitute water - can add lemon or orange slices for taste - for diet sodas (artificial sweeteners will trick your body that  you can eat sweets without getting calories and will lead you to overeating and weight gain in the long run) - do not skip breakfast or other meals (this will slow down the metabolism and will result in more weight gain over time)  - can try smoothies made from fruit and almond/rice milk in am instead of regular breakfast - can also try old-fashioned (not instant) oatmeal made with almond/rice milk in am - order the dressing on the side when eating salad at a restaurant (pour less than half of the dressing on the salad) - eat as little meat as possible - can try juicing, but should not forget that juicing will get rid of the fiber, so would alternate with eating raw veg./fruits or drinking smoothies - use as little oil as possible, even when using olive oil - can dress a salad with a mix of balsamic vinegar and lemon juice, for e.g. - use agave nectar, stevia sugar, or regular sugar rather than artificial sweateners - steam or broil/roast veggies  - snack on veggies/fruit/nuts (unsalted, preferably) when possible, rather than processed foods - reduce or eliminate aspartame in diet (it is in diet sodas, chewing gum, etc) Read the labels!  Try to read Dr. Janene Harvey book: "Program for Reversing Diabetes" for other ideas for healthy eating.
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
## 2019-08-08 ENCOUNTER — Encounter: Payer: Self-pay | Admitting: Internal Medicine

## 2019-09-03 ENCOUNTER — Encounter: Payer: Self-pay | Admitting: Internal Medicine

## 2019-09-26 ENCOUNTER — Other Ambulatory Visit: Payer: Self-pay | Admitting: Internal Medicine

## 2019-09-26 ENCOUNTER — Other Ambulatory Visit: Payer: Self-pay

## 2019-09-26 MED ORDER — OZEMPIC (1 MG/DOSE) 2 MG/1.5ML ~~LOC~~ SOPN: 1.0000 mg | PEN_INJECTOR | SUBCUTANEOUS | 1 refills | Status: DC

## 2019-10-01 ENCOUNTER — Other Ambulatory Visit: Payer: Self-pay

## 2019-10-01 MED ORDER — OZEMPIC (1 MG/DOSE) 2 MG/1.5ML ~~LOC~~ SOPN: 1.0000 mg | PEN_INJECTOR | SUBCUTANEOUS | 3 refills | Status: DC

## 2019-10-21 ENCOUNTER — Encounter: Payer: Self-pay | Admitting: Internal Medicine

## 2019-10-24 MED ORDER — OZEMPIC (1 MG/DOSE) 2 MG/1.5ML ~~LOC~~ SOPN: 1.0000 mg | PEN_INJECTOR | SUBCUTANEOUS | 1 refills | Status: DC

## 2019-10-24 MED ORDER — FREESTYLE LIBRE 14 DAY READER DEVI: 1.0000 | 0 refills | Status: DC

## 2019-10-24 MED ORDER — METFORMIN HCL 500 MG PO TABS: 500.0000 mg | ORAL_TABLET | Freq: Two times a day (BID) | ORAL | 1 refills | Status: DC

## 2019-11-06 ENCOUNTER — Ambulatory Visit: Payer: 59 | Admitting: Internal Medicine

## 2019-11-06 LAB — HM DIABETES EYE EXAM

## 2019-11-08 ENCOUNTER — Encounter: Payer: Self-pay | Admitting: Internal Medicine

## 2019-11-08 ENCOUNTER — Ambulatory Visit (INDEPENDENT_AMBULATORY_CARE_PROVIDER_SITE_OTHER): Payer: 59 | Admitting: Internal Medicine

## 2019-11-08 ENCOUNTER — Other Ambulatory Visit: Payer: Self-pay

## 2019-11-08 VITALS — BP 130/78 | HR 78 | Temp 98.1°F | Ht 63.4 in | Wt 197.4 lb

## 2019-11-08 DIAGNOSIS — E1122 Type 2 diabetes mellitus with diabetic chronic kidney disease: Secondary | ICD-10-CM | POA: Diagnosis not present

## 2019-11-08 DIAGNOSIS — D849 Immunodeficiency, unspecified: Secondary | ICD-10-CM

## 2019-11-08 DIAGNOSIS — N182 Chronic kidney disease, stage 2 (mild): Secondary | ICD-10-CM

## 2019-11-08 DIAGNOSIS — E6609 Other obesity due to excess calories: Secondary | ICD-10-CM

## 2019-11-08 DIAGNOSIS — Z940 Kidney transplant status: Secondary | ICD-10-CM

## 2019-11-08 DIAGNOSIS — Z794 Long term (current) use of insulin: Secondary | ICD-10-CM

## 2019-11-08 DIAGNOSIS — H10022 Other mucopurulent conjunctivitis, left eye: Secondary | ICD-10-CM

## 2019-11-08 DIAGNOSIS — Z6834 Body mass index (BMI) 34.0-34.9, adult: Secondary | ICD-10-CM

## 2019-11-08 DIAGNOSIS — I129 Hypertensive chronic kidney disease with stage 1 through stage 4 chronic kidney disease, or unspecified chronic kidney disease: Secondary | ICD-10-CM | POA: Diagnosis not present

## 2019-11-08 MED ORDER — ERYTHROMYCIN 5 MG/GM OP OINT: 1.0000 | TOPICAL_OINTMENT | Freq: Three times a day (TID) | OPHTHALMIC | 0 refills | Status: DC

## 2019-11-08 MED ORDER — AMLODIPINE BESYLATE 5 MG PO TABS: 5.0000 mg | ORAL_TABLET | Freq: Every day | ORAL | 2 refills | Status: DC

## 2019-11-08 NOTE — Progress Notes (Signed)
This visit occurred during the SARS-CoV-2 public health emergency.  Safety protocols were in place, including screening questions prior to the visit, additional usage of staff PPE, and extensive cleaning of exam room while observing appropriate contact time as indicated for disinfecting solutions.  Subjective:     Patient ID: [name] , female    DOB: 1976-05-04 , 44 y.o.   MRN: [id number]   Chief Complaint  Patient presents with  . Hypertension  . Diabetes    HPI  Hypertension This is a chronic problem. The current episode started more than 1 year ago. The problem is controlled. Pertinent negatives include no blurred vision, chest pain, palpitations or shortness of breath. The current treatment provides moderate improvement.  Diabetes She presents for her follow-up diabetic visit. She has type 2 diabetes mellitus. Her disease course has been improving. There are no hypoglycemic associated symptoms. Nervous/anxious: states she is nervous re: Covid-19. she realizes she is high risk. has panic attacks whenever she has to leave the house. Pertinent negatives for diabetes include no blurred vision and no chest pain. There are no hypoglycemic complications. Risk factors for coronary artery disease include diabetes mellitus, dyslipidemia, hypertension, sedentary lifestyle and obesity. She participates in exercise three times a week. An ACE inhibitor/angiotensin II receptor blocker is not being taken. Eye exam is not current.     Past Medical History:  Diagnosis Date  . Arthritis   . Diabetes mellitus   . HTN (hypertension)   . Pyelonephritis 09/30/11  . Renal failure   . S/P kidney transplant      Family History  Problem Relation Age of Onset  . Diabetes Mother   . Arthritis Mother   . Arthritis Father   . Prostate cancer Father   . Hypertension Father   . Breast cancer Maternal Aunt   . Breast cancer Paternal Aunt      Current Outpatient Medications:  .  amLODipine  (NORVASC) 5 MG tablet, Take 1 tablet (5 mg total) by mouth daily., Disp: 90 tablet, Rfl: 2 .  atorvastatin (LIPITOR) 10 MG tablet, Take 10 mg by mouth daily. , Disp: , Rfl:  .  Cholecalciferol (VITAMIN D3) 125 MCG (5000 UT) CAPS, Take by mouth daily., Disp: , Rfl:  .  Continuous Blood Gluc Receiver (FREESTYLE LIBRE 14 DAY READER) DEVI, Inject 1 each into the skin QID., Disp: 1 each, Rfl: 1 .  Continuous Blood Gluc Receiver (FREESTYLE LIBRE 14 DAY READER) DEVI, 1 Device by Does not apply route See admin instructions. For continuous glucose monitoring, Disp: 1 each, Rfl: 0 .  Continuous Blood Gluc Sensor (FREESTYLE LIBRE 14 DAY SENSOR) MISC, 1 each by Does not apply route 4 (four) times daily., Disp: 2 each, Rfl: 2 .  Continuous Blood Gluc Sensor (FREESTYLE LIBRE 2 SENSOR SYSTM) MISC, 1 each by Does not apply route every 14 (fourteen) days., Disp: 6 each, Rfl: 3 .  gabapentin (NEURONTIN) 100 MG capsule, Take 100 mg by mouth 3 (three) times daily as needed. , Disp: , Rfl:  .  Glucagon 3 MG/DOSE POWD, Place 3 mg into the nose once as needed for up to 1 dose., Disp: 1 each, Rfl: 11 .  Insulin Degludec (TRESIBA FLEXTOUCH) 200 UNIT/ML SOPN, Inject 54 Units into the skin at bedtime. (Patient taking differently: Inject 30 Units into the skin at bedtime. ), Disp: 18 pen, Rfl: 2 .  Insulin Pen Needle 31G X 5 MM MISC, Use as directed with insulin pen, Disp: 100 each, Rfl:  2 .  levonorgestrel (MIRENA) 20 MCG/24HR IUD, by Intrauterine route., Disp: , Rfl:  .  LINZESS 145 MCG CAPS capsule, Take 145 mcg by mouth daily. , Disp: , Rfl:  .  metFORMIN (GLUCOPHAGE) 500 MG tablet, Take 1 tablet (500 mg total) by mouth 2 (two) times daily with a meal., Disp: 180 tablet, Rfl: 1 .  mycophenolate (CELLCEPT) 250 MG capsule, Take 500 mg every morning and 750 mg every evening, Disp: , Rfl:  .  pantoprazole (PROTONIX) 40 MG tablet, Take 40 mg by mouth 2 (two) times daily. , Disp: , Rfl:  .  predniSONE (DELTASONE) 5 MG tablet,  Take 5 mg by mouth daily.  , Disp: , Rfl:  .  Semaglutide, 1 MG/DOSE, (OZEMPIC, 1 MG/DOSE,) 2 MG/1.5ML SOPN, Inject 1 mg into the skin once a week., Disp: 6 pen, Rfl: 1 .  tacrolimus (PROGRAF) 0.5 MG capsule, Take 2.5 mg every morning and 3 mg every evening, Disp: , Rfl:  .  erythromycin ophthalmic ointment, Place 1 application into both eyes 3 (three) times daily., Disp: 3.5 g, Rfl: 0 .  glipiZIDE (GLUCOTROL) 5 MG tablet, Take 1-2 tablets (5-10 mg total) by mouth daily before supper. (Patient not taking: Reported on 11/08/2019), Disp: 90 tablet, Rfl: 3   Allergies  Allergen Reactions  . Adhesive [Tape] Other (See Comments)    "plastic tape" irritates skin   Ok with paper tape   Review of Systems - Negative except eye redness. Recently seen by Retinal specialist, advised he could not treat this. He did not think this was pinkeye. She has had similar sx in the past, relieved witih Emycin ointment. No visual disturbances.     Today's Vitals   11/08/19 1550  BP: 130/78  Pulse: 78  Temp: 98.1 F (36.7 C)  Weight: 197 lb 6.4 oz (89.5 kg)  Height: 5' 3.4" (1.61 m)   Body mass index is 34.53 kg/m.   Objective:  Physical Exam Vitals and nursing note reviewed.  Constitutional:      Appearance: Normal appearance. She is obese.  HENT:     Head: Normocephalic and atraumatic.  Eyes:     General: Lids are normal.     Extraocular Movements: Extraocular movements intact.     Conjunctiva/sclera:     Left eye: Left conjunctiva is injected.  Cardiovascular:     Rate and Rhythm: Normal rate and regular rhythm.     Heart sounds: Normal heart sounds.  Pulmonary:     Effort: Pulmonary effort is normal.     Breath sounds: Normal breath sounds.  Skin:    General: Skin is warm.  Neurological:     General: No focal deficit present.     Mental Status: She is alert.  Psychiatric:        Mood and Affect: Mood normal.        Behavior: Behavior normal.         Assessment And Plan:     1.  Hypertensive nephropathy  Chronic, fair control. She will continue with current meds for now. She is encouraged to avoid adding salt to her foods.   2. Type 2 diabetes mellitus with stage 2 chronic kidney disease, with long-term current use of insulin (HCC)  Chronic, now followed by Endocrinology. She was congratulated on her weight loss thus far and encouraged to keep up the great work.   3. Immunosuppression (HCC)  Chronic, s/p renal transplant.   4. Class 1 obesity due to excess calories with serious  comorbidity and body mass index (BMI) of 34.0 to 34.9 in adult  Wt Readings from Last 3 Encounters:  11/08/19 197 lb 6.4 oz (89.5 kg)  08/02/19 203 lb 6.4 oz (92.3 kg)  05/08/19 209 lb 6.4 oz (95 kg)   She was congratulated on her 12 pound weight loss over the past six months. She is encouraged to strive for BMi less than 30 to decrease cardiac risk. Sh eis to aim for at least 150 minutes of exercise per week.  5. Kidney transplant recipient   6. Other mucopurulent conjunctivitis of left eye  She was given rx Emycin ointment to apply to eye tid. She is encouraged to email me via Mychart over the weekend to let me know how she is doing. Advised to seek care from ophthalmologist if she develops worsening sx, visual changes and/or eye pain.    [name], MD    THE PATIENT IS ENCOURAGED TO PRACTICE SOCIAL DISTANCING DUE TO THE COVID-19 PANDEMIC.

## 2019-11-10 ENCOUNTER — Encounter: Payer: Self-pay | Admitting: Internal Medicine

## 2019-11-13 ENCOUNTER — Other Ambulatory Visit: Payer: Self-pay

## 2019-11-15 ENCOUNTER — Encounter: Payer: Self-pay | Admitting: Internal Medicine

## 2019-11-15 ENCOUNTER — Ambulatory Visit (INDEPENDENT_AMBULATORY_CARE_PROVIDER_SITE_OTHER): Payer: 59 | Admitting: Internal Medicine

## 2019-11-15 ENCOUNTER — Other Ambulatory Visit: Payer: Self-pay

## 2019-11-15 VITALS — BP 110/70 | HR 90 | Ht 63.4 in | Wt 197.0 lb

## 2019-11-15 DIAGNOSIS — E1169 Type 2 diabetes mellitus with other specified complication: Secondary | ICD-10-CM | POA: Diagnosis not present

## 2019-11-15 DIAGNOSIS — N182 Chronic kidney disease, stage 2 (mild): Secondary | ICD-10-CM

## 2019-11-15 DIAGNOSIS — Z794 Long term (current) use of insulin: Secondary | ICD-10-CM | POA: Diagnosis not present

## 2019-11-15 DIAGNOSIS — E1122 Type 2 diabetes mellitus with diabetic chronic kidney disease: Secondary | ICD-10-CM | POA: Diagnosis not present

## 2019-11-15 DIAGNOSIS — E6609 Other obesity due to excess calories: Secondary | ICD-10-CM | POA: Diagnosis not present

## 2019-11-15 DIAGNOSIS — E785 Hyperlipidemia, unspecified: Secondary | ICD-10-CM

## 2019-11-15 DIAGNOSIS — Z6834 Body mass index (BMI) 34.0-34.9, adult: Secondary | ICD-10-CM

## 2019-11-15 LAB — POCT GLYCOSYLATED HEMOGLOBIN (HGB A1C): Hemoglobin A1C: 6.7 % — AB (ref 4.0–5.6)

## 2019-11-15 MED ORDER — TRESIBA FLEXTOUCH 200 UNIT/ML ~~LOC~~ SOPN: 24.0000 [IU] | PEN_INJECTOR | Freq: Every day | SUBCUTANEOUS | Status: DC

## 2019-11-15 NOTE — Patient Instructions (Signed)
Please continue: - Metformin 500 mg 2x a day, with meals - Ozempic 1 mg weekly  Please decrease: - Tresiba to 24 units daily  Please add: - Farxiga 5 mg before b'fast  Please return in 3 to 4 months.

## 2019-11-15 NOTE — Addendum Note (Signed)
Addended by: Cardell Peach I on: 11/15/2019 11:13 AM   Modules accepted: Orders
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
## 2019-11-19 ENCOUNTER — Encounter: Payer: Self-pay | Admitting: Internal Medicine

## 2019-11-19 ENCOUNTER — Other Ambulatory Visit: Payer: Self-pay | Admitting: Internal Medicine

## 2019-11-19 MED ORDER — FARXIGA 5 MG PO TABS: 5.0000 mg | ORAL_TABLET | Freq: Every day | ORAL | 5 refills | Status: DC

## 2019-11-27 ENCOUNTER — Encounter: Payer: Self-pay | Admitting: Internal Medicine

## 2019-11-30 ENCOUNTER — Other Ambulatory Visit (INDEPENDENT_AMBULATORY_CARE_PROVIDER_SITE_OTHER): Payer: 59

## 2019-11-30 ENCOUNTER — Other Ambulatory Visit: Payer: Self-pay | Admitting: Internal Medicine

## 2019-11-30 ENCOUNTER — Other Ambulatory Visit: Payer: Self-pay

## 2019-11-30 DIAGNOSIS — E1169 Type 2 diabetes mellitus with other specified complication: Secondary | ICD-10-CM

## 2019-11-30 DIAGNOSIS — E785 Hyperlipidemia, unspecified: Secondary | ICD-10-CM

## 2019-11-30 DIAGNOSIS — N182 Chronic kidney disease, stage 2 (mild): Secondary | ICD-10-CM

## 2019-11-30 DIAGNOSIS — E1122 Type 2 diabetes mellitus with diabetic chronic kidney disease: Secondary | ICD-10-CM

## 2019-11-30 DIAGNOSIS — Z794 Long term (current) use of insulin: Secondary | ICD-10-CM | POA: Diagnosis not present

## 2019-11-30 LAB — LIPID PANEL
Cholesterol: 123 mg/dL (ref 0–200)
HDL: 39.3 mg/dL (ref >39.00)
LDL Cholesterol: 67 mg/dL (ref 0–99)
NonHDL: 83.4
Total CHOL/HDL Ratio: 3
Triglycerides: 81 mg/dL (ref 0.0–149.0)
VLDL: 16.2 mg/dL (ref 0.0–40.0)

## 2019-11-30 LAB — BASIC METABOLIC PANEL
BUN: 25 mg/dL — ABNORMAL HIGH (ref 6–23)
CO2: 24 mEq/L (ref 19–32)
Calcium: 9.7 mg/dL (ref 8.4–10.5)
Chloride: 107 mEq/L (ref 96–112)
Creatinine, Ser: 1.51 mg/dL — ABNORMAL HIGH (ref 0.40–1.20)
GFR: 45.18 mL/min — ABNORMAL LOW (ref >60.00)
Glucose, Bld: 82 mg/dL (ref 70–99)
Potassium: 3.5 mEq/L (ref 3.5–5.1)
Sodium: 140 mEq/L (ref 135–145)

## 2020-01-23 ENCOUNTER — Other Ambulatory Visit: Payer: Self-pay | Admitting: Internal Medicine

## 2020-02-12 ENCOUNTER — Other Ambulatory Visit: Payer: Self-pay | Admitting: Internal Medicine

## 2020-02-12 DIAGNOSIS — Z1231 Encounter for screening mammogram for malignant neoplasm of breast: Secondary | ICD-10-CM

## 2020-03-01 ENCOUNTER — Emergency Department (HOSPITAL_COMMUNITY)
Admission: EM | Admit: 2020-03-01 | Discharge: 2020-03-02 | Discharge status: Against Medical Advice | Payer: 59 | Attending: Emergency Medicine | Admitting: Emergency Medicine

## 2020-03-01 ENCOUNTER — Other Ambulatory Visit: Payer: Self-pay

## 2020-03-01 ENCOUNTER — Encounter (HOSPITAL_COMMUNITY): Payer: Self-pay | Admitting: Emergency Medicine

## 2020-03-01 DIAGNOSIS — Z5321 Procedure and treatment not carried out due to patient leaving prior to being seen by health care provider: Secondary | ICD-10-CM | POA: Diagnosis not present

## 2020-03-01 DIAGNOSIS — M79652 Pain in left thigh: Secondary | ICD-10-CM | POA: Diagnosis not present

## 2020-03-01 NOTE — ED Triage Notes (Signed)
Pt states she got a tattoo on her left thigh x 2.5 weeks ago, reports bruising and a blister. States she used an antibiotic cream and the blister healed. C/o pain to the site now. Denies fevers at home.

## 2020-03-02 NOTE — ED Notes (Signed)
Registration states patient gave them her patient labels and told hem she was leaving. Patient not visible in lobby

## 2020-03-07 LAB — CBC AND DIFFERENTIAL
HCT: 36 (ref 36–46)
Hemoglobin: 12 (ref 12.0–16.0)
Neutrophils Absolute: 4
Platelets: 275 (ref 150–399)
WBC: 7.2

## 2020-03-07 LAB — LIPID PANEL
Cholesterol: 129 (ref 0–200)
HDL: 52 (ref 35–70)
LDL Cholesterol: 63
Triglycerides: 70 (ref 40–160)

## 2020-03-07 LAB — COMPREHENSIVE METABOLIC PANEL
Albumin: 4.1 (ref 3.5–5.0)
Calcium: 9.4 (ref 8.7–10.7)
GFR calc Af Amer: 56
GFR calc non Af Amer: 48

## 2020-03-07 LAB — BASIC METABOLIC PANEL
BUN: 20 (ref 4–21)
CO2: 31 — AB (ref 13–22)
Chloride: 106 (ref 99–108)
Creatinine: 1.3 — AB (ref 0.5–1.1)
Potassium: 3.7 (ref 3.4–5.3)
Sodium: 143 (ref 137–147)

## 2020-03-07 LAB — CBC: RBC: 4.12 (ref 3.87–5.11)

## 2020-03-07 LAB — HEPATIC FUNCTION PANEL
ALT: 10 (ref 7–35)
AST: 13 (ref 13–35)
Alkaline Phosphatase: 93 (ref 25–125)
Bilirubin, Total: 0.3

## 2020-03-07 LAB — HEMOGLOBIN A1C: Hemoglobin A1C: 6.2

## 2020-03-07 LAB — IRON,TIBC AND FERRITIN PANEL
Ferritin: 231
Iron: 58
UIBC: 130

## 2020-03-11 ENCOUNTER — Encounter: Payer: Self-pay | Admitting: Internal Medicine

## 2020-03-11 ENCOUNTER — Other Ambulatory Visit: Payer: Self-pay

## 2020-03-11 ENCOUNTER — Ambulatory Visit (INDEPENDENT_AMBULATORY_CARE_PROVIDER_SITE_OTHER): Payer: 59 | Admitting: Internal Medicine

## 2020-03-11 VITALS — BP 118/66 | HR 78 | Temp 98.0°F | Ht 63.4 in | Wt 197.2 lb

## 2020-03-11 DIAGNOSIS — L0390 Cellulitis, unspecified: Secondary | ICD-10-CM

## 2020-03-11 DIAGNOSIS — L03116 Cellulitis of left lower limb: Secondary | ICD-10-CM

## 2020-03-11 MED ORDER — SILVER SULFADIAZINE 1 % EX CREA: TOPICAL_CREAM | CUTANEOUS | 1 refills | Status: DC

## 2020-03-11 MED ORDER — CEFTRIAXONE SODIUM 500 MG IJ SOLR
500.0000 mg | Freq: Once | INTRAMUSCULAR | Status: AC
  Administered 2020-03-11: 500 mg via INTRAMUSCULAR

## 2020-03-11 MED ORDER — AMOXICILLIN-POT CLAVULANATE 875-125 MG PO TABS
1.0000 | ORAL_TABLET | Freq: Two times a day (BID) | ORAL | 0 refills | Status: AC
Start: 2020-03-11 — End: 2020-03-21
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
## 2020-03-13 ENCOUNTER — Telehealth: Payer: Self-pay

## 2020-03-13 NOTE — Telephone Encounter (Signed)
Called to check on patient pt is doing fine. Hip is not worse. Went to wound clinic they prescribed sandler cream and said that for the most part it looks good and its no tissue damage.

## 2020-03-13 NOTE — Telephone Encounter (Signed)
I was calling the pt because Dr. Baird Cancer wanted to check on  how does the pt's hip look?

## 2020-03-14 ENCOUNTER — Ambulatory Visit: Payer: 59 | Admitting: Internal Medicine

## 2020-03-20 ENCOUNTER — Encounter: Payer: Self-pay | Admitting: Internal Medicine

## 2020-03-24 ENCOUNTER — Encounter: Payer: Self-pay | Admitting: Internal Medicine

## 2020-03-28 ENCOUNTER — Encounter: Payer: Self-pay | Admitting: Internal Medicine

## 2020-03-28 ENCOUNTER — Other Ambulatory Visit: Payer: Self-pay

## 2020-03-28 ENCOUNTER — Ambulatory Visit
Admission: RE | Admit: 2020-03-28 | Discharge: 2020-03-28 | Disposition: A | Discharge status: Home / Self Care | Payer: 59 | Source: Ambulatory Visit | Attending: Internal Medicine | Admitting: Internal Medicine

## 2020-03-28 DIAGNOSIS — Z1231 Encounter for screening mammogram for malignant neoplasm of breast: Secondary | ICD-10-CM

## 2020-04-04 ENCOUNTER — Other Ambulatory Visit: Payer: Self-pay

## 2020-04-04 ENCOUNTER — Encounter: Payer: Self-pay | Admitting: Internal Medicine

## 2020-04-04 ENCOUNTER — Ambulatory Visit (INDEPENDENT_AMBULATORY_CARE_PROVIDER_SITE_OTHER): Payer: 59 | Admitting: Internal Medicine

## 2020-04-04 VITALS — BP 118/78 | HR 78 | Ht 63.4 in | Wt 199.0 lb

## 2020-04-04 DIAGNOSIS — E1122 Type 2 diabetes mellitus with diabetic chronic kidney disease: Secondary | ICD-10-CM | POA: Diagnosis not present

## 2020-04-04 DIAGNOSIS — E1169 Type 2 diabetes mellitus with other specified complication: Secondary | ICD-10-CM | POA: Diagnosis not present

## 2020-04-04 DIAGNOSIS — E6609 Other obesity due to excess calories: Secondary | ICD-10-CM

## 2020-04-04 DIAGNOSIS — Z794 Long term (current) use of insulin: Secondary | ICD-10-CM

## 2020-04-04 DIAGNOSIS — Z6834 Body mass index (BMI) 34.0-34.9, adult: Secondary | ICD-10-CM

## 2020-04-04 DIAGNOSIS — N182 Chronic kidney disease, stage 2 (mild): Secondary | ICD-10-CM | POA: Diagnosis not present

## 2020-04-04 DIAGNOSIS — E785 Hyperlipidemia, unspecified: Secondary | ICD-10-CM

## 2020-04-04 MED ORDER — INSULIN PEN NEEDLE 32G X 4 MM MISC: 3 refills | Status: DC

## 2020-04-04 MED ORDER — FIASP FLEXTOUCH 100 UNIT/ML ~~LOC~~ SOPN: PEN_INJECTOR | SUBCUTANEOUS | 3 refills | Status: DC

## 2020-04-04 MED ORDER — TRESIBA FLEXTOUCH 200 UNIT/ML ~~LOC~~ SOPN: 14.0000 [IU] | PEN_INJECTOR | Freq: Every day | SUBCUTANEOUS | Status: DC
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
## 2020-04-04 NOTE — Patient Instructions (Addendum)
Please continue: - Metformin 500 mg 2x a day, with meals - Ozempic 1 mg weekly  Please decrease: - Tresiba 12-14 units daily  Use:  - FiAsp 5-8 units before a larger lunch and dinner  Please return in 3 to 4 months.

## 2020-04-10 ENCOUNTER — Ambulatory Visit: Payer: 59 | Admitting: Internal Medicine

## 2020-05-07 ENCOUNTER — Encounter: Payer: Self-pay | Admitting: Internal Medicine

## 2020-05-20 ENCOUNTER — Encounter: Payer: 59 | Admitting: Internal Medicine

## 2020-05-30 LAB — HM PAP SMEAR: HM Pap smear: NEGATIVE

## 2020-06-07 ENCOUNTER — Other Ambulatory Visit: Payer: Self-pay | Admitting: Internal Medicine

## 2020-06-10 ENCOUNTER — Other Ambulatory Visit: Payer: Self-pay | Admitting: Internal Medicine

## 2020-06-11 ENCOUNTER — Encounter: Payer: Self-pay | Admitting: Internal Medicine

## 2020-06-12 ENCOUNTER — Encounter: Payer: Self-pay | Admitting: Internal Medicine

## 2020-06-13 ENCOUNTER — Other Ambulatory Visit: Payer: Self-pay | Admitting: Internal Medicine

## 2020-07-08 ENCOUNTER — Encounter: Payer: 59 | Admitting: Nurse Practitioner

## 2020-07-28 LAB — HM COLONOSCOPY

## 2020-07-31 ENCOUNTER — Encounter: Payer: Self-pay | Admitting: Internal Medicine

## 2020-08-01 ENCOUNTER — Encounter: Payer: Self-pay | Admitting: Internal Medicine

## 2020-08-01 ENCOUNTER — Other Ambulatory Visit: Payer: Self-pay

## 2020-08-01 ENCOUNTER — Ambulatory Visit (INDEPENDENT_AMBULATORY_CARE_PROVIDER_SITE_OTHER): Payer: 59 | Admitting: Internal Medicine

## 2020-08-01 VITALS — BP 128/70 | Ht 65.0 in | Wt 202.8 lb

## 2020-08-01 DIAGNOSIS — E1169 Type 2 diabetes mellitus with other specified complication: Secondary | ICD-10-CM

## 2020-08-01 DIAGNOSIS — Z794 Long term (current) use of insulin: Secondary | ICD-10-CM | POA: Diagnosis not present

## 2020-08-01 DIAGNOSIS — N182 Chronic kidney disease, stage 2 (mild): Secondary | ICD-10-CM

## 2020-08-01 DIAGNOSIS — E6609 Other obesity due to excess calories: Secondary | ICD-10-CM | POA: Diagnosis not present

## 2020-08-01 DIAGNOSIS — E785 Hyperlipidemia, unspecified: Secondary | ICD-10-CM

## 2020-08-01 DIAGNOSIS — E1122 Type 2 diabetes mellitus with diabetic chronic kidney disease: Secondary | ICD-10-CM | POA: Diagnosis not present

## 2020-08-01 DIAGNOSIS — Z6834 Body mass index (BMI) 34.0-34.9, adult: Secondary | ICD-10-CM

## 2020-08-01 LAB — POCT GLYCOSYLATED HEMOGLOBIN (HGB A1C): Hemoglobin A1C: 7.4 % — AB (ref 4.0–5.6)
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
## 2020-08-01 NOTE — Patient Instructions (Addendum)
Please continue: - Metformin 500 mg 2x a day, with meals - Ozempic 1 mg weekly - Tresiba 20 units daily  Increase: - FiAsp 8-14 units before meals  Please return in 3 to 4 months.

## 2020-08-01 NOTE — Addendum Note (Signed)
Addended by: Philemon Kingdom on: 08/01/2020 08:56 AM   Modules accepted: Orders

## 2020-08-02 ENCOUNTER — Other Ambulatory Visit: Payer: Self-pay | Admitting: Internal Medicine

## 2020-08-05 ENCOUNTER — Other Ambulatory Visit: Payer: Self-pay | Admitting: Internal Medicine

## 2020-09-08 ENCOUNTER — Other Ambulatory Visit: Payer: Self-pay | Admitting: Internal Medicine

## 2020-09-15 ENCOUNTER — Encounter: Payer: Self-pay | Admitting: Internal Medicine

## 2020-09-15 ENCOUNTER — Other Ambulatory Visit: Payer: Self-pay

## 2020-09-15 ENCOUNTER — Encounter: Payer: 59 | Attending: Internal Medicine | Admitting: Dietician

## 2020-09-15 ENCOUNTER — Encounter: Payer: Self-pay | Admitting: Dietician

## 2020-09-15 DIAGNOSIS — N182 Chronic kidney disease, stage 2 (mild): Secondary | ICD-10-CM | POA: Insufficient documentation

## 2020-09-15 DIAGNOSIS — E1122 Type 2 diabetes mellitus with diabetic chronic kidney disease: Secondary | ICD-10-CM | POA: Diagnosis not present

## 2020-09-15 DIAGNOSIS — Z794 Long term (current) use of insulin: Secondary | ICD-10-CM | POA: Diagnosis present

## 2020-09-15 NOTE — Patient Instructions (Addendum)
Consider starting a benefits check with [employer]  Drink more water rather than diet soda. Increase your vegetable and fruit intake. Choose whole grains most often. Stay active. Decrease your saturated fat and total fat.   Take insulin before your meals.

## 2020-09-15 NOTE — Progress Notes (Signed)
Diabetes Self-Management Education  Visit Type: First/Initial  Appt. Start Time: 1615 Appt. End Time: 8016  09/16/2020  [name], identified by name and date of birth, is a 44 y.o. female with a diagnosis of Diabetes: Type 2.   ASSESSMENT Patient is here today alone.  She states that she had education a long time a go and would like to know if there is a better way to control her blood sugar without so much medications. Patient of Dr. Cruzita Lederer.  She also sees a nephrologist.  History includes Type 2 diabetes, HLD, HTN, kidney transplant 2011, class 1 obesity. Previously she enquired about bariatric surgery at [facility] but did not qualify due to scar tissue from peritoneal dialysis.  Weight was 250 lbs at that time (about 3-4 years ago).  She was going to a physician led weight loss clinic.  She lost another 10 lbs due to stress from the Covid Pandemic.  Medications include Tresiba 20-30 units q HS, Fiasp 5-10 units before lunch and dinner, Metformin,  Ozempic.  She reports no issues in remembering her medication. She tried to get the Omnipod insulin pump in the past and this was not covered by her insurance.  She has new insurance now. A1C 6.5% 09/08/2020 at nephrologist's per patient, decreased from 7.4% 08/01/2020.  CGM:  FreeStyle Libre 2.  Patient lives with her husband and daughter. Patient does most of the shopping and cooking.  Her daughter also has Type 2 Diabetes.  She works for [employer] from home. She is thinking about a gym membership but is concerned about the bones in her feet breaking easily due to chronic Prednisone.  Her MD recommended aquatic fitness. She walks dog daily for 15-30 minutes.  Height _0  (1.651 m), weight 203 lb (92.1 kg). Body mass index is 33.78 kg/m.   Diabetes Self-Management Education - 09/15/20 1501      Visit Information   Visit Type First/Initial      Initial Visit   Diabetes Type Type 2    Are you currently following a meal  plan? No    Are you taking your medications as prescribed? Yes    Date Diagnosed [place]   How would you rate your overall health? Good      Psychosocial Assessment   Patient Belief/Attitude about Diabetes Motivated to manage diabetes    Self-care barriers None    Self-management support Doctor's office    Other persons present Patient    Patient Concerns Nutrition/Meal planning;Problem Solving    Special Needs None    Preferred Learning Style No preference indicated    Learning Readiness Ready    How often do you need to have someone help you when you read instructions, pamphlets, or other written materials from your doctor or pharmacy? 1 - Never    What is the last grade level you completed in school? Master's degree      Pre-Education Assessment   Patient understands the diabetes disease and treatment process. Needs Review    Patient understands incorporating nutritional management into lifestyle. Needs Review    Patient undertands incorporating physical activity into lifestyle. Needs Review    Patient understands using medications safely. Needs Review    Patient understands monitoring blood glucose, interpreting and using results Needs Review    Patient understands prevention, detection, and treatment of acute complications. Needs Review    Patient understands prevention, detection, and treatment of chronic complications. Needs Review  Patient understands how to develop strategies to address psychosocial issues. Needs Review    Patient understands how to develop strategies to promote health/change behavior. Needs Review      Complications   Last HgB A1C per patient/outside source 6.5 %   09/08/2020 decreased from 7.4% 08/01/2020   How often do you check your blood sugar? --   FreeStyle Libre 2   Fasting Blood glucose range (mg/dL) <70;70-129;130-179;180-200;>200   60-240   Postprandial Blood glucose range (mg/dL) >200    Number of hypoglycemic episodes per  month 9   takes insulin at times post meal   Can you tell when your blood sugar is low? Yes    What do you do if your blood sugar is low? juice or PB    Number of hyperglycemic episodes per week 2    Can you tell when your blood sugar is high? No    Have you had a dilated eye exam in the past 12 months? Yes    Have you had a dental exam in the past 12 months? Yes    Are you checking your feet? Yes    How many days per week are you checking your feet? 7      Dietary Intake   Breakfast often skips OR fast food sausage egg and cheese biscuit    Snack (morning) bagel and cream cheese   2 times per week and does not take insulin for this   Lunch soup OR bagel and cream cheese OR leftovers OR [place] Mike's Mini OR Popey's chicken, mac and cheese   12-2   Snack (afternoon) none    Dinner [employer] with alphredo sauce or shrimp fetitini alphredo (homemade with heavy cream)    Snack (evening) none    Beverage(s) zero sprite or zero Mt. Dew (2-3 daily), water, coffee with 1/2 oz regular flavored creamer, occasional sweet tea, occasional zero orange aide or diet ocean spray      Exercise   Exercise Type Light (walking / raking leaves)    How many days per week to you exercise? 7    How many minutes per day do you exercise? 20    Total minutes per week of exercise 140      Patient Education   Previous Diabetes Education Yes (please comment)    Disease state  Other (comment)   reviewed insulin resistance   Nutrition management  Role of diet in the treatment of diabetes and the relationship between the three main macronutrients and blood glucose level;Food label reading, portion sizes and measuring food.;Meal options for control of blood glucose level and chronic complications.    Physical activity and exercise  Role of exercise on diabetes management, blood pressure control and cardiac health.;Helped patient identify appropriate exercises in relation to his/her diabetes, diabetes  complications and other health issue.    Medications Reviewed patients medication for diabetes, action, purpose, timing of dose and side effects.    Monitoring Taught/discussed recording of test results and interpretation of SMBG.;Identified appropriate SMBG and/or A1C goals.;Daily foot exams;Yearly dilated eye exam    Acute complications Taught treatment of hypoglycemia - the 15 rule.;Discussed and identified patients' treatment of hyperglycemia.    Chronic complications Relationship between chronic complications and blood glucose control    Psychosocial adjustment Role of stress on diabetes;Identified and addressed patients feelings and concerns about diabetes      Individualized Goals (developed by patient)   Nutrition General guidelines for healthy choices and portions  discussed    Physical Activity Exercise 5-7 days per week;15 minutes per day    Medications take my medication as prescribed    Monitoring  test my blood glucose as discussed    Reducing Risk examine blood glucose patterns;do foot checks daily;increase portions of healthy fats      Post-Education Assessment   Patient understands the diabetes disease and treatment process. Demonstrates understanding / competency    Patient understands incorporating nutritional management into lifestyle. Needs Review    Patient undertands incorporating physical activity into lifestyle. Needs Review    Patient understands using medications safely. Demonstrates understanding / competency    Patient understands monitoring blood glucose, interpreting and using results Demonstrates understanding / competency    Patient understands prevention, detection, and treatment of acute complications. Demonstrates understanding / competency    Patient understands prevention, detection, and treatment of chronic complications. Demonstrates understanding / competency    Patient understands how to develop strategies to address psychosocial issues. Demonstrates  understanding / competency    Patient understands how to develop strategies to promote health/change behavior. Needs Review      Outcomes   Expected Outcomes Demonstrated interest in learning. Expect positive outcomes    Future DMSE 4-6 wks    Program Status Not Completed           Individualized Plan for Diabetes Self-Management Training:   Learning Objective:  Patient will have a greater understanding of diabetes self-management. Patient education plan is to attend individual and/or group sessions per assessed needs and concerns.   Plan:   Patient Instructions  Consider starting a benefits check with [employer]  Drink more water rather than diet soda. Increase your vegetable and fruit intake. Choose whole grains most often. Stay active. Decrease your saturated fat and total fat.   Take insulin before your meals.   Expected Outcomes:  Demonstrated interest in learning. Expect positive outcomes  Education material provided: ADA - How to Thrive: A Guide for Your Journey with Diabetes and Meal plan card  If problems or questions, patient to contact team via:  Phone  Future DSME appointment: 4-6 wks

## 2020-09-17 ENCOUNTER — Ambulatory Visit: Payer: 59 | Admitting: Nurse Practitioner

## 2020-10-13 ENCOUNTER — Encounter: Payer: Self-pay | Admitting: Internal Medicine

## 2020-10-21 ENCOUNTER — Telehealth: Payer: Self-pay | Admitting: Nutrition

## 2020-10-21 NOTE — Telephone Encounter (Signed)
Appointment scheduled for the 18th.  She was told to stop her [place] the night before, bring her PDM fully charged and her Fiasp insulin.  She reported good understanding and had no final questions.

## 2020-10-22 ENCOUNTER — Encounter: Payer: Self-pay | Admitting: Podiatry

## 2020-10-22 ENCOUNTER — Other Ambulatory Visit: Payer: Self-pay

## 2020-10-22 ENCOUNTER — Encounter: Payer: 59 | Attending: Internal Medicine | Admitting: Nutrition

## 2020-10-22 DIAGNOSIS — N182 Chronic kidney disease, stage 2 (mild): Secondary | ICD-10-CM | POA: Insufficient documentation

## 2020-10-22 DIAGNOSIS — Z794 Long term (current) use of insulin: Secondary | ICD-10-CM | POA: Insufficient documentation

## 2020-10-22 DIAGNOSIS — E1122 Type 2 diabetes mellitus with diabetic chronic kidney disease: Secondary | ICD-10-CM | POA: Insufficient documentation

## 2020-10-23 ENCOUNTER — Telehealth: Payer: Self-pay | Admitting: Nutrition

## 2020-10-23 NOTE — Telephone Encounter (Signed)
Patient reported that blood sugar before supper was 86, and 2hr. pc S tonight was 109.  She had 2u of IOB.  She says she had eaten pizza and followed the recommended carbs on her app.  But admits that she had a extra calories from some dip and crackers that she did not count after supper that her daughter had brought home.  We discussed again the IOB and what this means.  She will wait un additional 1/5 hours before going to bed, and if blood sugars are below 100, she was told to change carb ratio from 6 to 10, and decrease basal rate to 0.75.  She will also eat a snack of 15 grams of carb without a bolus.  We reviewed how to do this, and she reported good understanding of this.  She will call me in the AM

## 2020-10-23 NOTE — Patient Instructions (Signed)
Read over handouts given as well as the resource manual today. Call [phone] number if questions about how to use the pump.  Call office if blood sugars are dropping below 70, or remain over 200.

## 2020-10-23 NOTE — Progress Notes (Signed)
[name] is here to start her Dash pump.  She did not take her [place] last night.  Blood sugar is now 105.  She admits that she knows how to count carbs, but does not do this.  Discussed importance of doing this, and we reviewed what carbs were, and 15 gram portion sizes.  She downloaded the [employer] app to her phone, and agreed to do this.  She is currently eating 30 carbs for bfast, and taking 7u for this, with good readings acL.  Settings were put into the pump by the patient:  Basal rate: 0.85   I/C ratio:6  ISF: 30, target :100 with correction over 100, timing: 4 hours We discussed how pumps deliver insulin and she reported good understanding of this.  She filled a pod with Fiasp insulin, and applied the pod to her upper left abdomen.  She is using a Libre 14 day sensor, and we discussed the difference between sensor readings and blood sugar readings.  She reported good understanding of this.  She re demonstrated how to bolus, do a correction dose, and how to put the pump in stop/run.  She was given a resource guide to use with directions for this, as well as how to change the settings  She agreed to read this tonight.  We discussed when and how to use the temp basal rate, high blood sugar protocols, sick day guidelines, and what to carry with her at all times.  Handouts were given for all of this.  She had no final questions and signed the checklist as understanding all topics with no final questions.

## 2020-10-23 NOTE — Telephone Encounter (Signed)
Patient reported that her HS reading last night was 185 at 11:30PM  She did a correction dose of 2.6u and FBS today was 111.  She will continue with basal rate of 0.85.  She reported no difficulty with giving bolus this AM and is pleased with readings.  She had no questions.  She was reminded to call the OmniPod help line if questions about her pump.  She agreed to do this.

## 2020-10-24 ENCOUNTER — Ambulatory Visit: Payer: 59 | Admitting: Dietician

## 2020-10-24 LAB — HM DIABETES EYE EXAM

## 2020-10-27 ENCOUNTER — Telehealth: Payer: Self-pay | Admitting: Nutrition

## 2020-10-27 NOTE — Telephone Encounter (Signed)
Message left on voicemail to see how she is doing on her pump.  Ask her to call me back.  Telephone number given

## 2020-10-28 ENCOUNTER — Other Ambulatory Visit: Payer: Self-pay | Admitting: Internal Medicine

## 2020-10-28 MED ORDER — FIASP 100 UNIT/ML ~~LOC~~ SOLN: SUBCUTANEOUS | 3 refills | Status: DC

## 2020-10-28 NOTE — Telephone Encounter (Signed)
Patient reported no difficulty with insulin pump.  Denies difficulty with changing pods, giving boluses, counting carbs, or wearing pump.  Says highest reading was 169.  Has had 2 lows, but due to putting in more carbs than was eating. Has no questions for me at this time.

## 2020-10-28 NOTE — Telephone Encounter (Signed)
[name], how many per month? Ty, C

## 2020-10-28 NOTE — Telephone Encounter (Signed)
OK, Ty! I called it in.

## 2020-10-31 ENCOUNTER — Encounter: Payer: Self-pay | Admitting: Internal Medicine

## 2020-11-05 ENCOUNTER — Other Ambulatory Visit (INDEPENDENT_AMBULATORY_CARE_PROVIDER_SITE_OTHER): Payer: 59

## 2020-11-05 DIAGNOSIS — N182 Chronic kidney disease, stage 2 (mild): Secondary | ICD-10-CM

## 2020-11-05 DIAGNOSIS — Z794 Long term (current) use of insulin: Secondary | ICD-10-CM | POA: Diagnosis not present

## 2020-11-05 DIAGNOSIS — E1122 Type 2 diabetes mellitus with diabetic chronic kidney disease: Secondary | ICD-10-CM

## 2020-11-06 ENCOUNTER — Ambulatory Visit: Payer: 59 | Admitting: Dietician

## 2020-11-07 ENCOUNTER — Ambulatory Visit (INDEPENDENT_AMBULATORY_CARE_PROVIDER_SITE_OTHER): Payer: 59 | Admitting: Internal Medicine

## 2020-11-07 ENCOUNTER — Encounter: Payer: Self-pay | Admitting: Internal Medicine

## 2020-11-07 ENCOUNTER — Other Ambulatory Visit: Payer: Self-pay

## 2020-11-07 ENCOUNTER — Encounter: Payer: 59 | Attending: Internal Medicine | Admitting: Dietician

## 2020-11-07 VITALS — BP 128/88 | HR 88 | Ht 65.0 in | Wt 201.6 lb

## 2020-11-07 DIAGNOSIS — Z794 Long term (current) use of insulin: Secondary | ICD-10-CM

## 2020-11-07 DIAGNOSIS — E1169 Type 2 diabetes mellitus with other specified complication: Secondary | ICD-10-CM | POA: Diagnosis not present

## 2020-11-07 DIAGNOSIS — E785 Hyperlipidemia, unspecified: Secondary | ICD-10-CM

## 2020-11-07 DIAGNOSIS — E1122 Type 2 diabetes mellitus with diabetic chronic kidney disease: Secondary | ICD-10-CM | POA: Diagnosis present

## 2020-11-07 DIAGNOSIS — N182 Chronic kidney disease, stage 2 (mild): Secondary | ICD-10-CM | POA: Insufficient documentation

## 2020-11-07 DIAGNOSIS — E6609 Other obesity due to excess calories: Secondary | ICD-10-CM | POA: Diagnosis not present

## 2020-11-07 DIAGNOSIS — Z6834 Body mass index (BMI) 34.0-34.9, adult: Secondary | ICD-10-CM

## 2020-11-07 LAB — POCT GLYCOSYLATED HEMOGLOBIN (HGB A1C): Hemoglobin A1C: 6.8 % — AB (ref 4.0–5.6)

## 2020-11-07 NOTE — Patient Instructions (Signed)
Great job with the changes that you have made!  Drinking water  Simplifying your diet  Meal prepping  Reading labels   Choosing low fat choices  Avoid foods that have Phos... in the ingredient list. Going to the aquatic center sounds like a great idea. Consider decreasing your protein to about 3 ounces per meal. Consider increasing your carbohydrate to about 30 grams per meal. Consider ways to increase your starchy vegetables.

## 2020-11-07 NOTE — Progress Notes (Signed)
Diabetes Self-Management Education  Visit Type: Follow-up  Appt. Start Time: 1000 Appt. End Time: 1030  11/10/2020  [name], identified by name and date of birth, is a 45 y.o. female with a diagnosis of Diabetes: Type 2.   Patient is here today alone following her appointment with Dr. Cruzita Lederer.  She was last seen by myself 09/15/2020 and was trained on the Omnipod Insulin pump 10/2020 by Leonia Reader, RN, CDCES, CPT.  She also sees a nephrologist.  Patient states that she is dong well with the insulin pump and likes this. She is in range 83% of the time per FreeStyle Libre2 report.which is improved from 46% time in range in October. She reports confidence in carbohydrate counting. She is currently following a low carbohydrate, low fat diet for the past week.  She states that she was drinking [employer] drinks but has stopped this. She continues to walk the dogs, she is working with resistance bands and is planning on joining the aquatic center when she returns from some work travel.  History includes Type 2 diabetes (dx 45 yo), HLD, HTN, kidney transplant 2011, class 1 obesity. 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  Weight was 250 lbs at that time (about 3-4 years ago).  She was going to a physician led weight loss clinic.  She lost another 10 lbs due to stress from the Covid Pandemic.  Wt Readings from Last 3 Encounters:  11/10/20 201 lb (91.2 kg)  11/07/20 201 lb 9.6 oz (91.4 kg)  09/15/20 203 lb (92.1 kg)   Medications include  Fiasp via Omnipod DASH insulin pump, Metformin, Ozempic A1C 6.5% 09/08/2020 at nephrologist's per patient, decreased from 7.4% 08/01/2020.  CGM:  FreeStyle Libre 2.  Patient lives with her husband and daughter. Patient does most of the shopping and cooking.  Her daughter also has Type 2 Diabetes.  She works for [employer] from home. She is thinking about a gym  membership but is concerned about the bones in her feet breaking easily due to chronic Prednisone.  Her MD recommended aquatic fitness. She walks dog daily for 15-30 minutes.  ASSESSMENT  Weight 201 lb (91.2 kg). Body mass index is 33.45 kg/m.   Diabetes Self-Management Education - 11/10/20 1359      Visit Information   Visit Type Follow-up      Initial Visit   Diabetes Type Type 2    Are you currently following a meal plan? Yes   low carb, low fat   Are you taking your medications as prescribed? Yes      Psychosocial Assessment   Patient Belief/Attitude about Diabetes Motivated to manage diabetes    Self-care barriers None    Self-management support Doctor's office    Other persons present Patient    Patient Concerns Nutrition/Meal planning;Weight Control    Special Needs None    Preferred Learning Style No preference indicated    Learning Readiness Ready      Pre-Education Assessment   Patient understands the diabetes disease and treatment process. Demonstrates understanding / competency    Patient understands incorporating nutritional management into lifestyle. Needs Review    Patient undertands incorporating physical activity into lifestyle. Demonstrates understanding / competency    Patient understands using medications safely. Demonstrates understanding / competency    Patient understands monitoring blood glucose, interpreting and using results Demonstrates understanding / competency    Patient understands prevention, detection, and  treatment of acute complications. Demonstrates understanding / competency    Patient understands prevention, detection, and treatment of chronic complications. Demonstrates understanding / competency    Patient understands how to develop strategies to address psychosocial issues. Demonstrates understanding / competency    Patient understands how to develop strategies to promote health/change behavior. Needs Review      Complications   How  often do you check your blood sugar? > 4 times/day    Fasting Blood glucose range (mg/dL) 70-129    Postprandial Blood glucose range (mg/dL) 130-179;70-129      Dietary Intake   Breakfast Protein Shake (Premier, Slimfast, or chocolate protein powder with Almond Milk)    Lunch chicken (5oz), sweet potato (1/2 cup)    Snack (afternoon) [place] or P3 snack    Dinner beanless chili (1 cup) or lettuce wrap with [place] and Roast Beef    Snack (evening) none    Beverage(s) water, sprite zero or other diet soda, coffee, occasional diet ocean spray      Exercise   Exercise Type Light (walking / raking leaves)    How many days per week to you exercise? 7    How many minutes per day do you exercise? 20    Total minutes per week of exercise 140      Patient Education   Previous Diabetes Education Yes (please comment)   09/2020   Nutrition management  Food label reading, portion sizes and measuring food.;Meal options for control of blood glucose level and chronic complications.;Role of diet in the treatment of diabetes and the relationship between the three main macronutrients and blood glucose level    Medications Reviewed patients medication for diabetes, action, purpose, timing of dose and side effects.    Psychosocial adjustment Identified and addressed patients feelings and concerns about diabetes      Individualized Goals (developed by patient)   Nutrition General guidelines for healthy choices and portions discussed;Other (comment)   carb count   Physical Activity Exercise 5-7 days per week;30 minutes per day    Medications take my medication as prescribed    Monitoring  test my blood glucose as discussed    Reducing Risk examine blood glucose patterns;increase portions of healthy fats;treat hypoglycemia with 15 grams of carbs if blood glucose less than 70mg /dL      Patient Self-Evaluation of Goals - Patient rates self as meeting previously set goals (% of time)   Nutrition >75%    Physical  Activity >75%    Medications >75%    Monitoring >75%    Problem Solving >75%    Reducing Risk >75%    Health Coping >75%      Post-Education Assessment   Patient understands the diabetes disease and treatment process. Demonstrates understanding / competency    Patient understands incorporating nutritional management into lifestyle. Demonstrates understanding / competency    Patient undertands incorporating physical activity into lifestyle. Demonstrates understanding / competency    Patient understands using medications safely. Demonstrates understanding / competency    Patient understands monitoring blood glucose, interpreting and using results Demonstrates understanding / competency    Patient understands prevention, detection, and treatment of acute complications. Demonstrates understanding / competency    Patient understands prevention, detection, and treatment of chronic complications. Demonstrates understanding / competency    Patient understands how to develop strategies to address psychosocial issues. Demonstrates understanding / competency    Patient understands how to develop strategies to promote health/change behavior. Demonstrates understanding / competency  Outcomes   Expected Outcomes Demonstrated interest in learning. Expect positive outcomes    Future DMSE PRN    Program Status Completed      Subsequent Visit   Since your last visit have you experienced any weight changes? Loss    Weight Loss (lbs) 2           Individualized Plan for Diabetes Self-Management Training:   Learning Objective:  Patient will have a greater understanding of diabetes self-management. Patient education plan is to attend individual and/or group sessions per assessed needs and concerns.   Plan:   Patient Instructions  Doristine Devoid job with the changes that you have made!  Drinking water  Simplifying your diet  Meal prepping  Reading labels   Choosing low fat choices  Avoid foods that  have Phos... in the ingredient list. Going to the aquatic center sounds like a great idea. Consider decreasing your protein to about 3 ounces per meal. Consider increasing your carbohydrate to about 30 grams per meal. Consider ways to increase your starchy vegetables.   Expected Outcomes:  Demonstrated interest in learning. Expect positive outcomes  Education material provided:   If problems or questions, patient to contact team via:  Phone  Future DSME appointment: PRN
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
## 2020-11-07 NOTE — Patient Instructions (Addendum)
Please continue: - Ozempic 1 mg weekly - also insulin pump settings: - basal rates: 12 am: 0.85 units/h >> 0.75 - ICR: 1:6 - target: 100 - ISF: 1:30 - Active insulin time: 4 hours - bolus wizard: on  Please return in 3 months with your pump and CGM.

## 2020-11-10 ENCOUNTER — Encounter: Payer: Self-pay | Admitting: Internal Medicine

## 2020-11-10 ENCOUNTER — Encounter: Payer: Self-pay | Admitting: Dietician

## 2020-11-11 ENCOUNTER — Ambulatory Visit (INDEPENDENT_AMBULATORY_CARE_PROVIDER_SITE_OTHER): Payer: 59 | Admitting: Podiatry

## 2020-11-11 ENCOUNTER — Encounter: Payer: Self-pay | Admitting: Podiatry

## 2020-11-11 ENCOUNTER — Ambulatory Visit (INDEPENDENT_AMBULATORY_CARE_PROVIDER_SITE_OTHER): Payer: 59

## 2020-11-11 ENCOUNTER — Other Ambulatory Visit: Payer: Self-pay

## 2020-11-11 DIAGNOSIS — M2042 Other hammer toe(s) (acquired), left foot: Secondary | ICD-10-CM

## 2020-11-11 DIAGNOSIS — M76829 Posterior tibial tendinitis, unspecified leg: Secondary | ICD-10-CM

## 2020-11-11 DIAGNOSIS — E1142 Type 2 diabetes mellitus with diabetic polyneuropathy: Secondary | ICD-10-CM

## 2020-11-11 DIAGNOSIS — M2041 Other hammer toe(s) (acquired), right foot: Secondary | ICD-10-CM

## 2020-11-11 DIAGNOSIS — M778 Other enthesopathies, not elsewhere classified: Secondary | ICD-10-CM

## 2020-11-11 MED ORDER — GABAPENTIN 300 MG PO CAPS
300.0000 mg | ORAL_CAPSULE | Freq: Three times a day (TID) | ORAL | 3 refills | Status: DC
Start: 2020-11-11 — End: 2021-07-03

## 2020-11-12 NOTE — Progress Notes (Signed)
She presents today having not seen her since August 2020 when she had a fracture of the fifth metatarsal of the right foot.  She presents today with a chief complaint of pain to the dorsal forefoot bilaterally with pain toward lateral sides left greater than right.  She states that she is concerned the toes are starting to curl on the left foot.  She states that she has neuropathy but along with the numbness she has increased pain with walking.  She states that this has been going on now since 2021 December.  She denies any trauma.  States that her last hemoglobin A1c was at 6.8.  She denies any instability or falls.  Objective: Vital signs are stable she is alert oriented x3 pulses are palpable.  Diminished sensation per Thornell Mule monofilament bilateral.  Mild flexible hammertoe deformities are noted in the sagittal plane however the frontal plane is starting to develop some varus rotation.  She has mild tenderness on palpation of the dorsal midfoot and dorsal forefoot with pain on frontal plane range of motion particularly in the midfoot.  Radiographs demonstrate previously healed fracture second metatarsal left foot third metatarsal left foot fifth metatarsal right foot.  Hammertoe deformities are noted lateral view sinus Tarsi soft tissue swelling to the dorsal aspect of the bilateral foot some swelling in the posterior aspect of the distal leg and ankle.  Otherwise no significant findings.   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  Metatarsalgia.  Plan: We are going to schedule her with Liliane Channel for orthotics and I will follow-up with her once those come in.

## 2020-11-14 ENCOUNTER — Other Ambulatory Visit: Payer: Self-pay | Admitting: Podiatry

## 2020-11-14 DIAGNOSIS — M2041 Other hammer toe(s) (acquired), right foot: Secondary | ICD-10-CM

## 2020-11-14 DIAGNOSIS — M2042 Other hammer toe(s) (acquired), left foot: Secondary | ICD-10-CM

## 2020-11-26 ENCOUNTER — Encounter: Payer: Self-pay | Admitting: Internal Medicine

## 2020-11-27 ENCOUNTER — Other Ambulatory Visit: Payer: Self-pay | Admitting: Internal Medicine

## 2020-11-28 ENCOUNTER — Encounter: Payer: Self-pay | Admitting: Podiatry

## 2020-11-28 ENCOUNTER — Ambulatory Visit: Payer: 59

## 2020-11-28 ENCOUNTER — Other Ambulatory Visit: Payer: Self-pay

## 2020-11-28 DIAGNOSIS — M76821 Posterior tibial tendinitis, right leg: Secondary | ICD-10-CM

## 2020-11-28 DIAGNOSIS — M2041 Other hammer toe(s) (acquired), right foot: Secondary | ICD-10-CM

## 2020-11-28 DIAGNOSIS — M76822 Posterior tibial tendinitis, left leg: Secondary | ICD-10-CM

## 2020-11-28 DIAGNOSIS — E1142 Type 2 diabetes mellitus with diabetic polyneuropathy: Secondary | ICD-10-CM

## 2020-11-28 DIAGNOSIS — M2042 Other hammer toe(s) (acquired), left foot: Secondary | ICD-10-CM

## 2020-12-02 NOTE — Progress Notes (Signed)
Pt was in for orthotic casting. Pt will be called when orthotics are ready for pick up.

## 2020-12-24 ENCOUNTER — Ambulatory Visit (INDEPENDENT_AMBULATORY_CARE_PROVIDER_SITE_OTHER): Payer: 59 | Admitting: *Deleted

## 2020-12-24 ENCOUNTER — Other Ambulatory Visit: Payer: Self-pay

## 2020-12-24 DIAGNOSIS — M76829 Posterior tibial tendinitis, unspecified leg: Secondary | ICD-10-CM

## 2020-12-24 DIAGNOSIS — M2041 Other hammer toe(s) (acquired), right foot: Secondary | ICD-10-CM

## 2020-12-24 DIAGNOSIS — M2042 Other hammer toe(s) (acquired), left foot: Secondary | ICD-10-CM

## 2020-12-24 NOTE — Progress Notes (Signed)
Patient presents today to pick up custom molded foot orthotics, diagnosed with PTTD and hammertoes by Dr. Milinda Pointer.   Orthotics were dispensed and fit was satisfactory. Reviewed instructions for break-in and wear. Written instructions given to patient.  Patient will follow up as needed.   [facility] - order # [id number]

## 2020-12-30 LAB — COMPREHENSIVE METABOLIC PANEL
Albumin: 4.3 (ref 3.5–5.0)
Calcium: 9.1 (ref 8.7–10.7)
Globulin: 2.1

## 2020-12-30 LAB — CBC AND DIFFERENTIAL
HCT: 38 (ref 36–46)
Hemoglobin: 12.6 (ref 12.0–16.0)
Platelets: 251 (ref 150–399)
WBC: 7.3

## 2020-12-30 LAB — BASIC METABOLIC PANEL
BUN: 13 (ref 4–21)
Chloride: 106 (ref 99–108)
Creatinine: 11 — AB (ref 0.5–1.1)
Glucose: 123
Potassium: 3.5 (ref 3.4–5.3)
Sodium: 142 (ref 137–147)

## 2020-12-30 LAB — HEPATIC FUNCTION PANEL
ALT: 12 (ref 7–35)
AST: 13 (ref 13–35)
Alkaline Phosphatase: 122 (ref 25–125)
Bilirubin, Total: 0.4

## 2020-12-30 LAB — IRON,TIBC AND FERRITIN PANEL: Ferritin: 182

## 2021-01-07 ENCOUNTER — Encounter: Payer: Self-pay | Admitting: Internal Medicine

## 2021-01-07 NOTE — Progress Notes (Unsigned)
Received labs from nephrology, drawn on 12/26/2020:  HbA1c 6.7%, slightly improved: Lab Results  Component Value Date   HGBA1C 6.8 (A) 11/07/2020   CMP glucose 123, BUN/creatinine 13/1.17, GFR 59, alkaline phosphatase 122 (44-121)  Protein to creatinine ratio 138 (0-200)  Lipids: 173/71/56/63  Phosphorus 3.0 (3.0-4.3)  CBC normal

## 2021-01-13 ENCOUNTER — Encounter: Payer: Self-pay | Admitting: Internal Medicine

## 2021-01-13 ENCOUNTER — Other Ambulatory Visit: Payer: Self-pay

## 2021-01-13 ENCOUNTER — Ambulatory Visit (INDEPENDENT_AMBULATORY_CARE_PROVIDER_SITE_OTHER): Payer: 59 | Admitting: Internal Medicine

## 2021-01-13 VITALS — BP 114/82 | HR 84 | Temp 98.0°F | Ht 65.0 in | Wt 200.6 lb

## 2021-01-13 DIAGNOSIS — L853 Xerosis cutis: Secondary | ICD-10-CM

## 2021-01-13 DIAGNOSIS — Z6833 Body mass index (BMI) 33.0-33.9, adult: Secondary | ICD-10-CM

## 2021-01-13 DIAGNOSIS — E6609 Other obesity due to excess calories: Secondary | ICD-10-CM

## 2021-01-13 DIAGNOSIS — Z794 Long term (current) use of insulin: Secondary | ICD-10-CM | POA: Diagnosis not present

## 2021-01-13 DIAGNOSIS — N182 Chronic kidney disease, stage 2 (mild): Secondary | ICD-10-CM | POA: Diagnosis not present

## 2021-01-13 DIAGNOSIS — E785 Hyperlipidemia, unspecified: Secondary | ICD-10-CM

## 2021-01-13 DIAGNOSIS — E1169 Type 2 diabetes mellitus with other specified complication: Secondary | ICD-10-CM

## 2021-01-13 DIAGNOSIS — I129 Hypertensive chronic kidney disease with stage 1 through stage 4 chronic kidney disease, or unspecified chronic kidney disease: Secondary | ICD-10-CM | POA: Diagnosis not present

## 2021-01-13 DIAGNOSIS — E1122 Type 2 diabetes mellitus with diabetic chronic kidney disease: Secondary | ICD-10-CM

## 2021-01-13 LAB — POCT URINALYSIS DIPSTICK
Bilirubin, UA: NEGATIVE
Glucose, UA: NEGATIVE
Ketones, UA: NEGATIVE
Leukocytes, UA: NEGATIVE
Nitrite, UA: NEGATIVE
Protein, UA: POSITIVE — AB
Spec Grav, UA: 1.02 (ref 1.010–1.025)
Urobilinogen, UA: 0.2 E.U./dL
pH, UA: 5 (ref 5.0–8.0)

## 2021-01-13 LAB — POCT UA - MICROALBUMIN
Creatinine, POC: 300 mg/dL
Microalbumin Ur, POC: 150 mg/L

## 2021-01-13 NOTE — Patient Instructions (Signed)
Diabetes Mellitus and Foot Care Foot care is an important part of your health, especially when you have diabetes. Diabetes may cause you to have problems because of poor blood flow (circulation) to your feet and legs, which can cause your skin to:  Become thinner and drier.  Break more easily.  Heal more slowly.  Peel and crack. You may also have nerve damage (neuropathy) in your legs and feet, causing decreased feeling in them. This means that you may not notice minor injuries to your feet that could lead to more serious problems. Noticing and addressing any potential problems early is the best way to prevent future foot problems. How to care for your feet Foot hygiene  Wash your feet daily with warm water and mild soap. Do not use hot water. Then, pat your feet and the areas between your toes until they are completely dry. Do not soak your feet as this can dry your skin.  Trim your toenails straight across. Do not dig under them or around the cuticle. File the edges of your nails with an emery board or nail file.  Apply a moisturizing lotion or petroleum jelly to the skin on your feet and to dry, brittle toenails. Use lotion that does not contain alcohol and is unscented. Do not apply lotion between your toes.   Shoes and socks  Wear clean socks or stockings every day. Make sure they are not too tight. Do not wear knee-high stockings since they may decrease blood flow to your legs.  Wear shoes that fit properly and have enough cushioning. Always look in your shoes before you put them on to be sure there are no objects inside.  To break in new shoes, wear them for just a few hours a day. This prevents injuries on your feet. Wounds, scrapes, corns, and calluses  Check your feet daily for blisters, cuts, bruises, sores, and redness. If you cannot see the bottom of your feet, use a mirror or ask someone for help.  Do not cut corns or calluses or try to remove them with medicine.  If you  find a minor scrape, cut, or break in the skin on your feet, keep it and the skin around it clean and dry. You may clean these areas with mild soap and water. Do not clean the area with peroxide, alcohol, or iodine.  If you have a wound, scrape, corn, or callus on your foot, look at it several times a day to make sure it is healing and not infected. Check for: ? Redness, swelling, or pain. ? Fluid or blood. ? Warmth. ? Pus or a bad smell.   General tips  Do not cross your legs. This may decrease blood flow to your feet.  Do not use heating pads or hot water bottles on your feet. They may burn your skin. If you have lost feeling in your feet or legs, you may not know this is happening until it is too late.  Protect your feet from hot and cold by wearing shoes, such as at the beach or on hot pavement.  Schedule a complete foot exam at least once a year (annually) or more often if you have foot problems. Report any cuts, sores, or bruises to your health care provider immediately. Where to find more information  American Diabetes Association: www.diabetes.org  Association of Diabetes Care & Education Specialists: www.diabeteseducator.org Contact a health care provider if:  You have a medical condition that increases your risk of infection and   you have any cuts, sores, or bruises on your feet.  You have an injury that is not healing.  You have redness on your legs or feet.  You feel burning or tingling in your legs or feet.  You have pain or cramps in your legs and feet.  Your legs or feet are numb.  Your feet always feel cold.  You have pain around any toenails. Get help right away if:  You have a wound, scrape, corn, or callus on your foot and: ? You have pain, swelling, or redness that gets worse. ? You have fluid or blood coming from the wound, scrape, corn, or callus. ? Your wound, scrape, corn, or callus feels warm to the touch. ? You have pus or a bad smell coming from  the wound, scrape, corn, or callus. ? You have a fever. ? You have a red line going up your leg. Summary  Check your feet every day for blisters, cuts, bruises, sores, and redness.  Apply a moisturizing lotion or petroleum jelly to the skin on your feet and to dry, brittle toenails.  Wear shoes that fit properly and have enough cushioning.  If you have foot problems, report any cuts, sores, or bruises to your health care provider immediately.  Schedule a complete foot exam at least once a year (annually) or more often if you have foot problems. This information is not intended to replace advice given to you by your health care provider. Make sure you discuss any questions you have with your health care provider. Document Revised: 04/10/2020 Document Reviewed: 04/10/2020 Elsevier Patient Education  2021 [employer]  

## 2021-01-13 NOTE — Progress Notes (Signed)
I,Katawbba Wiggins,acting as a Education administrator for [name], MD.,have documented all relevant documentation on the behalf of [name], MD,as directed by  [name], MD while in the presence of [name], MD.  This visit occurred during the SARS-CoV-2 public health emergency.  Safety protocols were in place, including screening questions prior to the visit, additional usage of staff PPE, and extensive cleaning of exam room while observing appropriate contact time as indicated for disinfecting solutions.  Subjective:     Patient ID: [name] , female    DOB: [birth year] , 45 y.o.   MRN: [id number]   Chief Complaint  Patient presents with  . Hypertension  . Diabetes    HPI  The patient is here today for a blood pressure and diabetes f/u.  She is also followed by Endocrinology for DM mgmt.  The patient would also like a referral to a dermatologist for dry skin.    Hypertension This is a chronic problem. The current episode started more than 1 year ago. The problem is controlled. Pertinent negatives include no blurred vision, chest pain, palpitations or shortness of breath. The current treatment provides moderate improvement.  Diabetes She presents for her follow-up diabetic visit. She has type 2 diabetes mellitus. Her disease course has been improving. There are no hypoglycemic associated symptoms. Nervous/anxious: states she is nervous re: Covid-19. she realizes she is high risk. has panic attacks whenever she has to leave the house. Pertinent negatives for diabetes include no blurred vision and no chest pain. There are no hypoglycemic complications. Risk factors for coronary artery disease include diabetes mellitus, dyslipidemia, hypertension, sedentary lifestyle and obesity. She participates in exercise three times a week. An ACE inhibitor/angiotensin II receptor blocker is not being taken. Eye exam is not current.     Past Medical History:  Diagnosis Date  . Arthritis    . Diabetes mellitus   . HTN (hypertension)   . Hyperlipidemia   . Pyelonephritis 09/30/11  . Renal failure   . S/P kidney transplant      Family History  Problem Relation Age of Onset  . Diabetes Mother   . Arthritis Mother   . Arthritis Father   . Prostate cancer Father   . Hypertension Father   . Breast cancer Maternal Aunt   . Breast cancer Paternal Aunt      Current Outpatient Medications:  .  amLODipine (NORVASC) 5 MG tablet, TAKE 1 TABLET BY MOUTH EVERY DAY, Disp: 90 tablet, Rfl: 2 .  atorvastatin (LIPITOR) 10 MG tablet, Take 10 mg by mouth daily. , Disp: , Rfl:  .  BAQSIMI ONE PACK 3 MG/DOSE POWD, PLACE 3 MG INTO THE NOSE ONCE AS NEEDED FOR UP TO 1 DOSE., Disp: 1 each, Rfl: 6 .  BD PEN NEEDLE NANO 2ND GEN 32G X 4 MM MISC, USE 3X A DAY, Disp: 300 each, Rfl: 3 .  Cholecalciferol (VITAMIN D3) 125 MCG (5000 UT) CAPS, Take by mouth daily., Disp: , Rfl:  .  Continuous Blood Gluc Receiver (FREESTYLE LIBRE 14 DAY READER) DEVI, Inject 1 each into the skin QID., Disp: 1 each, Rfl: 1 .  Continuous Blood Gluc Sensor (FREESTYLE LIBRE 2 SENSOR) MISC, 1 EACH BY DOES NOT APPLY ROUTE EVERY 14 (FOURTEEN) DAYS., Disp: 6 each, Rfl: 3 .  gabapentin (NEURONTIN) 300 MG capsule, Take 1 capsule (300 mg total) by mouth 3 (three) times daily., Disp: 90 capsule, Rfl: 3 .  Insulin Aspart, w/Niacinamide, (FIASP) 100 UNIT/ML SOLN,  Use up to 60 units a day in the insulin pump, Disp: 60 mL, Rfl: 3 .  Insulin Disposable Pump (OMNIPOD DASH 5 PACK PODS) MISC, USE AS INSTRUCTED CHANGE EVERY 1.5 DAY, Disp: 135 each, Rfl: 0 .  levonorgestrel (MIRENA) 20 MCG/24HR IUD, by Intrauterine route., Disp: , Rfl:  .  LINZESS 145 MCG CAPS capsule, Take 145 mcg by mouth daily. , Disp: , Rfl:  .  metFORMIN (GLUCOPHAGE) 500 MG tablet, TAKE 1 TABLET (500 MG TOTAL) BY MOUTH 2 (TWO) TIMES DAILY WITH A MEAL., Disp: 180 tablet, Rfl: 1 .  mycophenolate (CELLCEPT) 250 MG capsule, Take 500 mg every morning and 750 mg every evening,  Disp: , Rfl:  .  OZEMPIC, 1 MG/DOSE, 4 MG/3ML SOPN, INJECT 1 MG INTO SKIN ONCE A WEEK, Disp: 9 mL, Rfl: 2 .  pantoprazole (PROTONIX) 40 MG tablet, Take 40 mg by mouth 2 (two) times daily. , Disp: , Rfl:  .  predniSONE (DELTASONE) 5 MG tablet, Take 5 mg by mouth daily., Disp: , Rfl:  .  Semaglutide, 1 MG/DOSE, (OZEMPIC, 1 MG/DOSE,) 2 MG/1.5ML SOPN, Inject 1 mg into the skin once a week., Disp: 6 pen, Rfl: 1 .  Semaglutide-Weight Management (WEGOVY) 1.7 MG/0.75ML SOAJ, Inject 1.7 mg into the skin once a week., Disp: 3 mL, Rfl: 0 .  tacrolimus (PROGRAF) 0.5 MG capsule, Take 2.5 mg every morning and 3 mg every evening, Disp: , Rfl:    Allergies  Allergen Reactions  . Adhesive [Tape] Other (See Comments)    "plastic tape" irritates skin   Ok with paper tape  . Wound Dressing Adhesive     Other reaction(s): Other "plastic tape" irritates skin   Ok with paper tape     Review of Systems  Constitutional: Negative.   Eyes: Negative for blurred vision.  Respiratory: Negative.  Negative for shortness of breath.   Cardiovascular: Negative.  Negative for chest pain and palpitations.  Gastrointestinal: Negative.   Skin:       Dry skin  Psychiatric/Behavioral: Negative.  Nervous/anxious: states she is nervous re: Covid-19. she realizes she is high risk. has panic attacks whenever she has to leave the house.   All other systems reviewed and are negative.    Today's Vitals   01/13/21 1137  BP: 114/82  Pulse: 84  Temp: 98 F (36.7 C)  TempSrc: Oral  Weight: 200 lb 9.6 oz (91 kg)  Height: 5\' 5"  (1.651 m)   Body mass index is 33.38 kg/m.  Wt Readings from Last 3 Encounters:  01/13/21 200 lb 9.6 oz (91 kg)  11/10/20 201 lb (91.2 kg)  11/07/20 201 lb 9.6 oz (91.4 kg)   Objective:  Physical Exam Vitals and nursing note reviewed.  Constitutional:      Appearance: Normal appearance. She is obese.  HENT:     Head: Normocephalic and atraumatic.     Nose:     Comments: Masked      Mouth/Throat:     Comments: Masked  Cardiovascular:     Rate and Rhythm: Normal rate and regular rhythm.     Heart sounds: Normal heart sounds.  Pulmonary:     Effort: Pulmonary effort is normal.     Breath sounds: Normal breath sounds.  Musculoskeletal:     Cervical back: Normal range of motion.  Skin:    General: Skin is warm.     Comments: Scattered tattoos  Neurological:     General: No focal deficit present.  Mental Status: She is alert.  Psychiatric:        Mood and Affect: Mood normal.        Behavior: Behavior normal.         Assessment And Plan:     1. Hypertensive nephropathy Comments: Chronic, well controlled. She will c/w amlodipine.  She is encouraged to follow low sodium diet.   2. Type 2 diabetes mellitus with stage 2 chronic kidney disease, with long-term current use of insulin (HCC) Comments: Chronic, Endo notes reviewed in full detail.  - Lipid panel - POCT Urinalysis Dipstick (81002) - POCT UA - Microalbumin  3. Hyperlipidemia associated with type 2 diabetes mellitus ([place]) Comments: I will check lipid panel today. Enocuraged to follow heart healthy lifestyle - free of fried foods, exercise 150 min/wk and increased fiber intake.   4. Dry skin dermatitis Comments: She is encouraged to increase her intake of healthy fats and to increase hydration. I will refer her to Derm if her sx persist.   5. Class 1 obesity due to excess calories with serious comorbidity and body mass index (BMI) of 33.0 to 33.9 in adult  She is encouraged to strive for BMI less than 30 to decrease cardiac risk. Advised to aim for at least 150 minutes of exercise per week.   Patient was given opportunity to ask questions. Patient verbalized understanding of the plan and was able to repeat key elements of the plan. All questions were answered to their satisfaction.   I, [name], MD, have reviewed all documentation for this visit. The documentation on 01/13/21 for the exam,  diagnosis, procedures, and orders are all accurate and complete.   IF YOU HAVE BEEN REFERRED TO A SPECIALIST, IT MAY TAKE 1-2 WEEKS TO SCHEDULE/PROCESS THE REFERRAL. IF YOU HAVE NOT HEARD FROM US/SPECIALIST IN TWO WEEKS, PLEASE GIVE [place] A CALL AT [phone].   THE PATIENT IS ENCOURAGED TO PRACTICE SOCIAL DISTANCING DUE TO THE COVID-19 PANDEMIC.

## 2021-01-14 ENCOUNTER — Other Ambulatory Visit: Payer: Self-pay | Admitting: Internal Medicine

## 2021-01-14 LAB — LIPID PANEL
Chol/HDL Ratio: 2.5 ratio (ref 0.0–4.4)
Cholesterol, Total: 133 mg/dL (ref 100–199)
HDL: 54 mg/dL (ref >39)
LDL Chol Calc (NIH): 63 mg/dL (ref 0–99)
Triglycerides: 81 mg/dL (ref 0–149)
VLDL Cholesterol Cal: 16 mg/dL (ref 5–40)

## 2021-01-15 ENCOUNTER — Other Ambulatory Visit: Payer: Self-pay

## 2021-01-15 ENCOUNTER — Encounter: Payer: Self-pay | Admitting: Internal Medicine

## 2021-01-15 ENCOUNTER — Other Ambulatory Visit (HOSPITAL_COMMUNITY): Payer: Self-pay

## 2021-01-15 MED ORDER — WEGOVY 1.7 MG/0.75ML ~~LOC~~ SOAJ
1.7000 mg | SUBCUTANEOUS | 0 refills | Status: DC
  Filled 2021-01-15 – 2021-01-22 (×7): qty 3, 28d supply, fill #0

## 2021-01-16 ENCOUNTER — Other Ambulatory Visit (HOSPITAL_COMMUNITY): Payer: Self-pay

## 2021-01-17 ENCOUNTER — Other Ambulatory Visit: Payer: Self-pay | Admitting: Internal Medicine

## 2021-01-20 ENCOUNTER — Encounter: Payer: Self-pay | Admitting: Internal Medicine

## 2021-01-20 ENCOUNTER — Other Ambulatory Visit (HOSPITAL_COMMUNITY): Payer: Self-pay

## 2021-01-20 DIAGNOSIS — E1122 Type 2 diabetes mellitus with diabetic chronic kidney disease: Secondary | ICD-10-CM

## 2021-01-20 DIAGNOSIS — N182 Chronic kidney disease, stage 2 (mild): Secondary | ICD-10-CM

## 2021-01-21 ENCOUNTER — Telehealth: Payer: Self-pay

## 2021-01-21 NOTE — Telephone Encounter (Signed)
Prior auth done for wegovy 1.7mg , waiting on a response form the [employer].

## 2021-01-22 ENCOUNTER — Other Ambulatory Visit (HOSPITAL_COMMUNITY): Payer: Self-pay

## 2021-01-22 MED ORDER — OMNIPOD DASH PODS (GEN 4) MISC: 0 refills | Status: DC

## 2021-01-23 ENCOUNTER — Other Ambulatory Visit: Payer: Self-pay | Admitting: Internal Medicine

## 2021-01-23 DIAGNOSIS — E1122 Type 2 diabetes mellitus with diabetic chronic kidney disease: Secondary | ICD-10-CM

## 2021-02-06 ENCOUNTER — Other Ambulatory Visit: Payer: Self-pay

## 2021-02-06 ENCOUNTER — Encounter: Payer: Self-pay | Admitting: Internal Medicine

## 2021-02-06 ENCOUNTER — Ambulatory Visit (INDEPENDENT_AMBULATORY_CARE_PROVIDER_SITE_OTHER): Payer: 59 | Admitting: Internal Medicine

## 2021-02-06 VITALS — BP 138/88 | Ht 65.0 in | Wt 200.4 lb

## 2021-02-06 DIAGNOSIS — E6609 Other obesity due to excess calories: Secondary | ICD-10-CM

## 2021-02-06 DIAGNOSIS — E785 Hyperlipidemia, unspecified: Secondary | ICD-10-CM

## 2021-02-06 DIAGNOSIS — N182 Chronic kidney disease, stage 2 (mild): Secondary | ICD-10-CM

## 2021-02-06 DIAGNOSIS — E1169 Type 2 diabetes mellitus with other specified complication: Secondary | ICD-10-CM | POA: Diagnosis not present

## 2021-02-06 DIAGNOSIS — Z794 Long term (current) use of insulin: Secondary | ICD-10-CM | POA: Diagnosis not present

## 2021-02-06 DIAGNOSIS — E1122 Type 2 diabetes mellitus with diabetic chronic kidney disease: Secondary | ICD-10-CM

## 2021-02-06 DIAGNOSIS — Z6834 Body mass index (BMI) 34.0-34.9, adult: Secondary | ICD-10-CM
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
## 2021-02-06 NOTE — Patient Instructions (Addendum)
Please change: - Ozempic 2.4 mg weekly  Change: - Insulin pump settings: - basal rates: 12 am: 1 >> 0.90 units/h  - ICR: 1:8 (may need to change to 1:7 if sugars are high after meals) - target: 100 - ISF: 30 - Active insulin time: 4 hours - bolus wizard: on  Please return in 3-4 months with your pump and CGM.

## 2021-02-10 ENCOUNTER — Encounter: Payer: Self-pay | Admitting: Internal Medicine

## 2021-02-10 ENCOUNTER — Other Ambulatory Visit (HOSPITAL_COMMUNITY): Payer: Self-pay

## 2021-02-10 ENCOUNTER — Other Ambulatory Visit: Payer: Self-pay

## 2021-02-10 ENCOUNTER — Telehealth (INDEPENDENT_AMBULATORY_CARE_PROVIDER_SITE_OTHER): Payer: 59 | Admitting: Internal Medicine

## 2021-02-10 ENCOUNTER — Telehealth: Payer: Self-pay

## 2021-02-10 VITALS — Wt 197.0 lb

## 2021-02-10 DIAGNOSIS — R0981 Nasal congestion: Secondary | ICD-10-CM | POA: Diagnosis not present

## 2021-02-10 DIAGNOSIS — J0110 Acute frontal sinusitis, unspecified: Secondary | ICD-10-CM | POA: Diagnosis not present

## 2021-02-10 MED ORDER — FLUCONAZOLE 150 MG PO TABS: ORAL_TABLET | ORAL | 0 refills | Status: DC

## 2021-02-10 MED ORDER — AMOXICILLIN-POT CLAVULANATE 875-125 MG PO TABS: 1.0000 | ORAL_TABLET | Freq: Two times a day (BID) | ORAL | 0 refills | Status: AC

## 2021-02-10 MED ORDER — WEGOVY 1.7 MG/0.75ML ~~LOC~~ SOAJ
1.7000 mg | SUBCUTANEOUS | 0 refills | Status: DC
  Filled 2021-02-10: qty 3, 28d supply, fill #0

## 2021-02-10 NOTE — Telephone Encounter (Signed)
I left the pt a message that I was calling to schedule her a virtual appt and that her refill for wegovy has been sent to her pharamcy.

## 2021-02-10 NOTE — Progress Notes (Signed)
Virtual Visit via Video   This visit type was conducted due to national recommendations for restrictions regarding the COVID-19 Pandemic (e.g. social distancing) in an effort to limit this patient's exposure and mitigate transmission in our community.  Due to her co-morbid illnesses, this patient is at least at moderate risk for complications without adequate follow up.  This format is felt to be most appropriate for this patient at this time.  All issues noted in this document were discussed and addressed.  A limited physical exam was performed with this format.    This visit type was conducted due to national recommendations for restrictions regarding the COVID-19 Pandemic (e.g. social distancing) in an effort to limit this patient's exposure and mitigate transmission in our community.  Patients identity confirmed using two different identifiers.  This format is felt to be most appropriate for this patient at this time.  All issues noted in this document were discussed and addressed.  No physical exam was performed (except for noted visual exam findings with Video Visits).    Date:  02/15/2021   ID:  [name], DOB [birth year], MRN [id number]  Patient Location:  Home  Provider location:   Office  Chief Complaint:  "I think I have a sinus infection"  History of Present Illness:    [name] is a 45 y.o. female who presents via video conferencing for a telehealth visit today.   The patient does have symptoms concerning for COVID-19 infection (fever, chills, cough, or new shortness of breath).   She presents today for virtual visit. She prefers this method of contact due to COVID-19 pandemic.  She denies recent travel. She presents today for further evaluation of sinus congestion. Feels like she may have a sinus infection. She denies fever/chills. Her sx started about a week ago. She has had no relief with otc meds. She denies known ill contacts.   Sinus Problem This is a  recurrent problem. The current episode started in the past 7 days. The problem is unchanged. There has been no fever. Her pain is at a severity of 4/10. The pain is moderate. Associated symptoms include congestion, coughing, headaches, a hoarse voice, sinus pressure and a sore throat. Pertinent negatives include no shortness of breath or sneezing. Past treatments include nothing. The treatment provided no relief.     Past Medical History:  Diagnosis Date  . Arthritis   . Diabetes mellitus   . HTN (hypertension)   . Hyperlipidemia   . Pyelonephritis 09/30/11  . Renal failure   . S/P kidney transplant    Past Surgical History:  Procedure Laterality Date  . CARPAL TUNNEL RELEASE Right 11/02/2018   Procedure: RIGHT CARPAL TUNNEL RELEASE;  Surgeon: Leanora Cover, MD;  Location: [facility];  Service: Orthopedics;  Laterality: Right;  . CESAREAN SECTION  1996  . KIDNEY TRANSPLANT  03/2010   right  . loop av graft  05/2009   left upper arm  . PERITONEAL CATHETER INSERTION  ~ 2003  . TUBAL LIGATION  2000  . ULNAR NERVE TRANSPOSITION Right 11/02/2018   Procedure: ULNAR NERVE DECOMPRESSION;  Surgeon: Leanora Cover, MD;  Location: [facility];  Service: Orthopedics;  Laterality: Right;     Current Meds  Medication Sig  . amLODipine (NORVASC) 5 MG tablet TAKE 1 TABLET BY MOUTH EVERY DAY  . amoxicillin-clavulanate (AUGMENTIN) 875-125 MG tablet Take 1 tablet by mouth 2 (two) times daily for 7 days.  Marland Kitchen atorvastatin (LIPITOR) 10  MG tablet Take 10 mg by mouth daily.   Marland Kitchen BAQSIMI ONE PACK 3 MG/DOSE POWD PLACE 3 MG INTO THE NOSE ONCE AS NEEDED FOR UP TO 1 DOSE.  Marland Kitchen BD PEN NEEDLE NANO 2ND GEN 32G X 4 MM MISC USE 3X A DAY  . Cholecalciferol (VITAMIN D3) 125 MCG (5000 UT) CAPS Take by mouth daily.  . Continuous Blood Gluc Receiver (FREESTYLE LIBRE 14 DAY READER) DEVI Inject 1 each into the skin QID.  Marland Kitchen Continuous Blood Gluc Sensor (FREESTYLE LIBRE 2 SENSOR) MISC 1 EACH BY DOES NOT  APPLY ROUTE EVERY 14 (FOURTEEN) DAYS.  . fluconazole (DIFLUCAN) 150 MG tablet Take one tab po today, repeat in 48 hours prn  . gabapentin (NEURONTIN) 300 MG capsule Take 1 capsule (300 mg total) by mouth 3 (three) times daily.  . Insulin Aspart, w/Niacinamide, (FIASP) 100 UNIT/ML SOLN Use up to 60 units a day in the insulin pump  . Insulin Disposable Pump (OMNIPOD DASH 5 PACK PODS) MISC USE AS INSTRUCTED CHANGE EVERY 1.5 DAY  . levonorgestrel (MIRENA) 20 MCG/24HR IUD by Intrauterine route.  Marland Kitchen LINZESS 145 MCG CAPS capsule Take 145 mcg by mouth daily.   . metFORMIN (GLUCOPHAGE) 500 MG tablet TAKE 1 TABLET (500 MG TOTAL) BY MOUTH 2 (TWO) TIMES DAILY WITH A MEAL.  . mycophenolate (CELLCEPT) 250 MG capsule Take 500 mg every morning and 750 mg every evening  . pantoprazole (PROTONIX) 40 MG tablet Take 40 mg by mouth 2 (two) times daily.   . predniSONE (DELTASONE) 5 MG tablet Take 5 mg by mouth daily.  . Semaglutide-Weight Management (WEGOVY) 1.7 MG/0.75ML SOAJ Inject 1.7 mg into the skin once a week.  . tacrolimus (PROGRAF) 0.5 MG capsule Take 2.5 mg every morning and 3 mg every evening  . [DISCONTINUED] Semaglutide, 1 MG/DOSE, (OZEMPIC, 1 MG/DOSE,) 2 MG/1.5ML SOPN Inject 1 mg into the skin once a week.     Allergies:   Adhesive [tape] and Wound dressing adhesive   Social History   Tobacco Use  . Smoking status: Never Smoker  . Smokeless tobacco: Never Used  Vaping Use  . Vaping Use: Never used  Substance Use Topics  . Alcohol use: No  . Drug use: No     Family Hx: The patient's family history includes Arthritis in her father and mother; Breast cancer in her maternal aunt and paternal aunt; Diabetes in her mother; Hypertension in her father; Prostate cancer in her father.  ROS:   Please see the history of present illness.    Review of Systems  Constitutional: Positive for malaise/fatigue.  HENT: Positive for congestion, hoarse voice, sinus pressure, sinus pain and sore throat. Negative  for sneezing.   Respiratory: Positive for cough. Negative for shortness of breath.   Cardiovascular: Negative.   Genitourinary: Negative.   Musculoskeletal: Negative.   Neurological: Positive for headaches.    All other systems reviewed and are negative.   Labs/Other Tests and Data Reviewed:    Recent Labs: 12/30/2020: ALT 12; BUN 13; Creatinine 11.0; Hemoglobin 12.6; Platelets 251; Potassium 3.5; Sodium 142   Recent Lipid Panel Lab Results  Component Value Date/Time   CHOL 133 01/13/2021 12:22 PM   TRIG 81 01/13/2021 12:22 PM   HDL 54 01/13/2021 12:22 PM   CHOLHDL 2.5 01/13/2021 12:22 PM   CHOLHDL 3 11/30/2019 09:21 AM   LDLCALC 63 01/13/2021 12:22 PM    Wt Readings from Last 3 Encounters:  02/10/21 197 lb (89.4 kg)  02/06/21 200 lb 6.4  oz (90.9 kg)  01/13/21 200 lb 9.6 oz (91 kg)     Exam:    Vital Signs:  Wt 197 lb (89.4 kg)   BMI 32.78 kg/m     Physical Exam Vitals and nursing note reviewed.  Constitutional:      Appearance: Normal appearance. She is obese.  HENT:     Head: Normocephalic and atraumatic.     Nose:     Comments: She sounds congested.  Eyes:     Extraocular Movements: Extraocular movements intact.  Pulmonary:     Effort: Pulmonary effort is normal.     Comments: Able to speak in full sentences Musculoskeletal:     Cervical back: Normal range of motion.  Neurological:     Mental Status: She is alert and oriented to person, place, and time.  Psychiatric:        Mood and Affect: Affect normal.     ASSESSMENT & PLAN:    1. Acute non-recurrent frontal sinusitis Comments: I will send rx Augmentin to her local pharmacy. Advised to take bid and to complete the full course. She was given rx diflucan to use prn. She reports having yeast infections with all abx.  - amoxicillin-clavulanate (AUGMENTIN) 875-125 MG tablet; Take 1 tablet by mouth 2 (two) times daily for 7 days.  Dispense: 14 tablet; Refill: 0 - fluconazole (DIFLUCAN) 150 MG tablet;  Take one tab po today, repeat in 48 hours prn  Dispense: 2 tablet; Refill: 0  2. Sinus congestion Comments: She agrees to COVID testing, she will come to office later today.  - Novel Coronavirus, NAA ([facility]) - SARS-COV-2, NAA 2 DAY TAT   COVID-19 Education: The signs and symptoms of COVID-19 were discussed with the patient and how to seek care for testing (follow up with PCP or arrange E-visit).  The importance of social distancing was discussed today.  Patient Risk:   After full review of this patients clinical status, I feel that they are at least moderate risk at this time.  Time:   Today, I have spent 12 minutes/ seconds with the patient with telehealth technology discussing above diagnoses.     Medication Adjustments/Labs and Tests Ordered: Current medicines are reviewed at length with the patient today.  Concerns regarding medicines are outlined above.   Tests Ordered: Orders Placed This Encounter  Procedures  . Novel Coronavirus, NAA ([facility])  . SARS-COV-2, NAA 2 DAY TAT    Medication Changes: Meds ordered this encounter  Medications  . amoxicillin-clavulanate (AUGMENTIN) 875-125 MG tablet    Sig: Take 1 tablet by mouth 2 (two) times daily for 7 days.    Dispense:  14 tablet    Refill:  0  . fluconazole (DIFLUCAN) 150 MG tablet    Sig: Take one tab po today, repeat in 48 hours prn    Dispense:  2 tablet    Refill:  0    Disposition:  Follow up prn  Signed, [name], MD

## 2021-02-10 NOTE — Telephone Encounter (Signed)
The pt consented to have a virtual appt for evaluation of a sinus infection

## 2021-02-11 ENCOUNTER — Encounter: Payer: Self-pay | Admitting: Internal Medicine

## 2021-02-11 LAB — NOVEL CORONAVIRUS, NAA: SARS-CoV-2, NAA: NOT DETECTED

## 2021-02-11 LAB — SARS-COV-2, NAA 2 DAY TAT

## 2021-02-12 ENCOUNTER — Other Ambulatory Visit (HOSPITAL_COMMUNITY): Payer: Self-pay

## 2021-02-19 ENCOUNTER — Encounter: Payer: Self-pay | Admitting: Internal Medicine

## 2021-02-19 ENCOUNTER — Ambulatory Visit (INDEPENDENT_AMBULATORY_CARE_PROVIDER_SITE_OTHER): Payer: 59 | Admitting: Internal Medicine

## 2021-02-19 ENCOUNTER — Other Ambulatory Visit (HOSPITAL_COMMUNITY): Payer: Self-pay

## 2021-02-19 ENCOUNTER — Other Ambulatory Visit: Payer: Self-pay

## 2021-02-19 VITALS — BP 110/74 | HR 71 | Temp 98.5°F | Ht 65.0 in | Wt 194.4 lb

## 2021-02-19 DIAGNOSIS — D849 Immunodeficiency, unspecified: Secondary | ICD-10-CM

## 2021-02-19 DIAGNOSIS — Z6832 Body mass index (BMI) 32.0-32.9, adult: Secondary | ICD-10-CM

## 2021-02-19 DIAGNOSIS — Z940 Kidney transplant status: Secondary | ICD-10-CM | POA: Diagnosis not present

## 2021-02-19 DIAGNOSIS — E6609 Other obesity due to excess calories: Secondary | ICD-10-CM

## 2021-02-19 MED ORDER — WEGOVY 2.4 MG/0.75ML ~~LOC~~ SOAJ
2.4000 mg | SUBCUTANEOUS | 2 refills | Status: DC
  Filled 2021-02-19: qty 3, 28d supply, fill #0
  Filled 2021-03-30: qty 3, 28d supply, fill #1

## 2021-02-19 NOTE — Progress Notes (Signed)
I,Katawbba Wiggins,acting as a Education administrator for [name], MD.,have documented all relevant documentation on the behalf of [name], MD,as directed by  [name], MD while in the presence of [name], MD.  This visit occurred during the SARS-CoV-2 public health emergency.  Safety protocols were in place, including screening questions prior to the visit, additional usage of staff PPE, and extensive cleaning of exam room while observing appropriate contact time as indicated for disinfecting solutions.  Subjective:     Patient ID: [name] , female    DOB: 07/04/1976 , 45 y.o.   MRN: [id number]   Chief Complaint  Patient presents with  . Obesity    HPI  The patient is here today for a follow-up on her weight. She has been taking Wegovy without any issues. She reports she has been exercising as well.     Past Medical History:  Diagnosis Date  . Arthritis   . Diabetes mellitus   . HTN (hypertension)   . Hyperlipidemia   . Pyelonephritis 09/30/11  . Renal failure   . S/P kidney transplant      Family History  Problem Relation Age of Onset  . Diabetes Mother   . Arthritis Mother   . Arthritis Father   . Prostate cancer Father   . Hypertension Father   . Breast cancer Maternal Aunt   . Breast cancer Paternal Aunt      Current Outpatient Medications:  .  amLODipine (NORVASC) 5 MG tablet, TAKE 1 TABLET BY MOUTH EVERY DAY, Disp: 90 tablet, Rfl: 2 .  atorvastatin (LIPITOR) 10 MG tablet, Take 10 mg by mouth daily. , Disp: , Rfl:  .  BAQSIMI ONE PACK 3 MG/DOSE POWD, PLACE 3 MG INTO THE NOSE ONCE AS NEEDED FOR UP TO 1 DOSE., Disp: 1 each, Rfl: 6 .  Cholecalciferol (VITAMIN D3) 125 MCG (5000 UT) CAPS, Take by mouth daily., Disp: , Rfl:  .  Continuous Blood Gluc Receiver (FREESTYLE LIBRE 14 DAY READER) DEVI, Inject 1 each into the skin QID., Disp: 1 each, Rfl: 1 .  Continuous Blood Gluc Sensor (FREESTYLE LIBRE 2 SENSOR) MISC, 1 EACH BY DOES NOT APPLY ROUTE  EVERY 14 (FOURTEEN) DAYS., Disp: 6 each, Rfl: 3 .  gabapentin (NEURONTIN) 300 MG capsule, Take 1 capsule (300 mg total) by mouth 3 (three) times daily., Disp: 90 capsule, Rfl: 3 .  Insulin Aspart, w/Niacinamide, (FIASP) 100 UNIT/ML SOLN, Use up to 60 units a day in the insulin pump, Disp: 60 mL, Rfl: 3 .  Insulin Disposable Pump (OMNIPOD DASH 5 PACK PODS) MISC, USE AS INSTRUCTED CHANGE EVERY 1.5 DAY, Disp: 135 each, Rfl: 0 .  lamoTRIgine (LAMICTAL) 25 MG tablet, Take by mouth. 25 mg , next Tuesday 50 mg daily for 2 wks, then 100mg  daily, Disp: , Rfl:  .  levonorgestrel (MIRENA) 20 MCG/24HR IUD, by Intrauterine route., Disp: , Rfl:  .  LORazepam (ATIVAN) 0.5 MG tablet, Take by mouth. prn, Disp: , Rfl:  .  metFORMIN (GLUCOPHAGE) 500 MG tablet, TAKE 1 TABLET (500 MG TOTAL) BY MOUTH 2 (TWO) TIMES DAILY WITH A MEAL., Disp: 180 tablet, Rfl: 1 .  mycophenolate (CELLCEPT) 250 MG capsule, Take 500 mg every morning and 750 mg every evening, Disp: , Rfl:  .  pantoprazole (PROTONIX) 40 MG tablet, Take 40 mg by mouth 2 (two) times daily. , Disp: , Rfl:  .  predniSONE (DELTASONE) 5 MG tablet, Take 5 mg by mouth daily., Disp: ,  Rfl:  .  QUEtiapine (SEROQUEL) 50 MG tablet, Take 50 mg by mouth at bedtime., Disp: , Rfl:  .  Semaglutide-Weight Management (WEGOVY) 2.4 MG/0.75ML SOAJ, Inject 2.4 mg into the skin once a week., Disp: 3 mL, Rfl: 2 .  tacrolimus (PROGRAF) 0.5 MG capsule, Take 2.5 mg every morning and 3 mg every evening, Disp: , Rfl:  .  BD PEN NEEDLE NANO 2ND GEN 32G X 4 MM MISC, USE 3X A DAY, Disp: 300 each, Rfl: 3   Allergies  Allergen Reactions  . Adhesive [Tape] Other (See Comments)    "plastic tape" irritates skin   Ok with paper tape  . Wound Dressing Adhesive     Other reaction(s): Other "plastic tape" irritates skin   Ok with paper tape     Review of Systems  Constitutional: Negative.   Respiratory: Negative.   Cardiovascular: Negative.   Gastrointestinal: Negative.    Psychiatric/Behavioral: Negative.   All other systems reviewed and are negative.    Today's Vitals   02/19/21 1535  BP: 110/74  Pulse: 71  Temp: 98.5 F (36.9 C)  TempSrc: Oral  Weight: 194 lb 6.4 oz (88.2 kg)  Height: 5\' 5"  (1.651 m)   Body mass index is 32.35 kg/m.  Wt Readings from Last 3 Encounters:  02/19/21 194 lb 6.4 oz (88.2 kg)  02/10/21 197 lb (89.4 kg)  02/06/21 200 lb 6.4 oz (90.9 kg)   BP Readings from Last 3 Encounters:  02/19/21 110/74  02/06/21 138/88  01/13/21 114/82   Objective:  Physical Exam Vitals and nursing note reviewed.  Constitutional:      Appearance: Normal appearance. She is obese.  HENT:     Head: Normocephalic and atraumatic.     Nose:     Comments: Masked     Mouth/Throat:     Comments: Masked  Cardiovascular:     Rate and Rhythm: Normal rate and regular rhythm.     Heart sounds: Normal heart sounds.  Pulmonary:     Effort: Pulmonary effort is normal.     Breath sounds: Normal breath sounds.  Musculoskeletal:     Cervical back: Normal range of motion.  Skin:    General: Skin is warm.  Neurological:     General: No focal deficit present.     Mental Status: She is alert.  Psychiatric:        Mood and Affect: Mood normal.        Behavior: Behavior normal.         Assessment And Plan:     1. Class 1 obesity due to excess calories with serious comorbidity and body mass index (BMI) of 32.0 to 32.9 in adult Comments: She has lost 7 pounds since Feb 2022. She is encouraged to aim for at least 150 minutes of exercise/week. I will increase Wegovy to 2.4mg  wkly. F/u 8 wks.   2. 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   3. Kidney transplant status  Patient was given opportunity to ask questions. Patient verbalized understanding of the plan and was able to repeat key elements of the plan. All questions were answered to their satisfaction.   I, [name], MD, have reviewed all documentation for  this visit. The documentation on 02/19/21 for the exam, diagnosis, procedures, and orders are all accurate and complete.   IF YOU HAVE BEEN REFERRED TO A SPECIALIST, IT MAY TAKE 1-2 WEEKS TO SCHEDULE/PROCESS THE REFERRAL. IF YOU HAVE NOT HEARD FROM US/SPECIALIST IN TWO WEEKS,  PLEASE GIVE [place] A CALL AT [phone].   THE PATIENT IS ENCOURAGED TO PRACTICE SOCIAL DISTANCING DUE TO THE COVID-19 PANDEMIC.

## 2021-02-19 NOTE — Patient Instructions (Signed)

## 2021-02-28 ENCOUNTER — Encounter: Payer: Self-pay | Admitting: Internal Medicine

## 2021-03-03 ENCOUNTER — Encounter: Payer: Self-pay | Admitting: Internal Medicine

## 2021-03-03 ENCOUNTER — Ambulatory Visit (INDEPENDENT_AMBULATORY_CARE_PROVIDER_SITE_OTHER): Payer: 59 | Admitting: Nurse Practitioner

## 2021-03-03 ENCOUNTER — Other Ambulatory Visit: Payer: Self-pay

## 2021-03-03 DIAGNOSIS — N3001 Acute cystitis with hematuria: Secondary | ICD-10-CM

## 2021-03-03 DIAGNOSIS — R300 Dysuria: Secondary | ICD-10-CM

## 2021-03-03 LAB — POCT URINALYSIS DIPSTICK
Glucose, UA: NEGATIVE
Ketones, UA: NEGATIVE
Nitrite, UA: POSITIVE
Protein, UA: POSITIVE — AB
Spec Grav, UA: 1.025 (ref 1.010–1.025)
Urobilinogen, UA: 0.2 E.U./dL
pH, UA: 5.5 (ref 5.0–8.0)

## 2021-03-03 MED ORDER — NITROFURANTOIN MONOHYD MACRO 100 MG PO CAPS
100.0000 mg | ORAL_CAPSULE | Freq: Two times a day (BID) | ORAL | 0 refills | Status: DC
Start: 2021-03-03 — End: 2021-03-06

## 2021-03-03 NOTE — Progress Notes (Signed)
I,Tianna Badgett,acting as a Education administrator for [employer], NP.,have documented all relevant documentation on the behalf of [employer], NP,as directed by  Bary Castilla, NP while in the presence of Bary Castilla, NP.  This visit occurred during the SARS-CoV-2 public health emergency.  Safety protocols were in place, including screening questions prior to the visit, additional usage of staff PPE, and extensive cleaning of exam room while observing appropriate contact time as indicated for disinfecting solutions.  Subjective:     Patient ID: [name] , female    DOB: 1976-06-24 , 45 y.o.   MRN: [id number]   Chief Complaint  Patient presents with  . Dysuria    HPI  Patient is here for pressure with urination. No burning. No flank pain. Denies fever or chills     Past Medical History:  Diagnosis Date  . Arthritis   . Diabetes mellitus   . HTN (hypertension)   . Hyperlipidemia   . Pyelonephritis 09/30/11  . Renal failure   . S/P kidney transplant      Family History  Problem Relation Age of Onset  . Diabetes Mother   . Arthritis Mother   . Arthritis Father   . Prostate cancer Father   . Hypertension Father   . Breast cancer Maternal Aunt   . Breast cancer Paternal Aunt      Current Outpatient Medications:  .  nitrofurantoin, macrocrystal-monohydrate, (MACROBID) 100 MG capsule, Take 1 capsule (100 mg total) by mouth 2 (two) times daily for 7 days., Disp: 14 capsule, Rfl: 0 .  amLODipine (NORVASC) 5 MG tablet, TAKE 1 TABLET BY MOUTH EVERY DAY, Disp: 90 tablet, Rfl: 2 .  atorvastatin (LIPITOR) 10 MG tablet, Take 10 mg by mouth daily. , Disp: , Rfl:  .  BAQSIMI ONE PACK 3 MG/DOSE POWD, PLACE 3 MG INTO THE NOSE ONCE AS NEEDED FOR UP TO 1 DOSE., Disp: 1 each, Rfl: 6 .  BD PEN NEEDLE NANO 2ND GEN 32G X 4 MM MISC, USE 3X A DAY, Disp: 300 each, Rfl: 3 .  Cholecalciferol (VITAMIN D3) 125 MCG (5000 UT) CAPS, Take by mouth daily., Disp: , Rfl:  .  Continuous Blood  Gluc Receiver (FREESTYLE LIBRE 14 DAY READER) DEVI, Inject 1 each into the skin QID., Disp: 1 each, Rfl: 1 .  Continuous Blood Gluc Sensor (FREESTYLE LIBRE 2 SENSOR) MISC, 1 EACH BY DOES NOT APPLY ROUTE EVERY 14 (FOURTEEN) DAYS., Disp: 6 each, Rfl: 3 .  gabapentin (NEURONTIN) 300 MG capsule, Take 1 capsule (300 mg total) by mouth 3 (three) times daily., Disp: 90 capsule, Rfl: 3 .  Insulin Aspart, w/Niacinamide, (FIASP) 100 UNIT/ML SOLN, Use up to 60 units a day in the insulin pump, Disp: 60 mL, Rfl: 3 .  Insulin Disposable Pump (OMNIPOD DASH 5 PACK PODS) MISC, USE AS INSTRUCTED CHANGE EVERY 1.5 DAY, Disp: 135 each, Rfl: 0 .  lamoTRIgine (LAMICTAL) 25 MG tablet, Take by mouth. 25 mg , next Tuesday 50 mg daily for 2 wks, then 100mg  daily, Disp: , Rfl:  .  levonorgestrel (MIRENA) 20 MCG/24HR IUD, by Intrauterine route., Disp: , Rfl:  .  LORazepam (ATIVAN) 0.5 MG tablet, Take by mouth. prn, Disp: , Rfl:  .  metFORMIN (GLUCOPHAGE) 500 MG tablet, TAKE 1 TABLET (500 MG TOTAL) BY MOUTH 2 (TWO) TIMES DAILY WITH A MEAL., Disp: 180 tablet, Rfl: 1 .  mycophenolate (CELLCEPT) 250 MG capsule, Take 500 mg every morning and 750 mg every evening, Disp: , Rfl:  .  pantoprazole (PROTONIX) 40 MG tablet, Take 40 mg by mouth 2 (two) times daily. , Disp: , Rfl:  .  predniSONE (DELTASONE) 5 MG tablet, Take 5 mg by mouth daily., Disp: , Rfl:  .  QUEtiapine (SEROQUEL) 50 MG tablet, Take 50 mg by mouth at bedtime., Disp: , Rfl:  .  Semaglutide-Weight Management (WEGOVY) 2.4 MG/0.75ML SOAJ, Inject 2.4 mg into the skin once a week., Disp: 3 mL, Rfl: 2 .  tacrolimus (PROGRAF) 0.5 MG capsule, Take 2.5 mg every morning and 3 mg every evening, Disp: , Rfl:    Allergies  Allergen Reactions  . Adhesive [Tape] Other (See Comments)    "plastic tape" irritates skin   Ok with paper tape  . Wound Dressing Adhesive     Other reaction(s): Other "plastic tape" irritates skin   Ok with paper tape     Review of Systems   Constitutional: Negative.  Negative for chills and fever.  HENT: Negative for congestion, sinus pressure, sinus pain and sneezing.   Respiratory: Negative.   Cardiovascular: Negative.   Gastrointestinal: Negative.   Genitourinary: Positive for dysuria and urgency. Negative for flank pain.  Musculoskeletal: Negative for back pain.  Neurological: Negative.      There were no vitals filed for this visit. There is no height or weight on file to calculate BMI.   Objective:  Physical Exam Constitutional:      Appearance: Normal appearance.  HENT:     Head: Normocephalic and atraumatic.  Cardiovascular:     Rate and Rhythm: Normal rate and regular rhythm.     Pulses: Normal pulses.     Heart sounds: Normal heart sounds.  Pulmonary:     Effort: Pulmonary effort is normal. No respiratory distress.     Breath sounds: Normal breath sounds. No rales.  Skin:    General: Skin is warm and dry.     Capillary Refill: Capillary refill takes less than 2 seconds.     Coloration: Skin is not jaundiced.  Neurological:     Mental Status: She is alert.         Assessment And Plan:     1. Dysuria - POCT Urinalysis Dipstick (81002)  2. Acute cystitis with hematuria - nitrofurantoin, macrocrystal-monohydrate, (MACROBID) 100 MG capsule; Take 1 capsule (100 mg total) by mouth 2 (two) times daily for 7 days.  Dispense: 14 capsule; Refill: 0 - Culture, Urine  -urine positive for Leukocytes and nitrites. Will go ahead and treat and send urine for culture.  -Patient to return in 4-6 weeks for nurse visit for repeat urine   The patient was encouraged to call or send a message through [place] for any questions or concerns.   Side effects and appropriate use of all the medication(s) were discussed with the patient today. Patient advised to use the medication(s) as directed by their healthcare provider. The patient was encouraged to read, review, and understand all associated package inserts and  contact our office with any questions or concerns. The patient accepts the risks of the treatment plan and had an opportunity to ask questions.   Follow up: if symptoms persist or do not get better.   Patient was given opportunity to ask questions. Patient verbalized understanding of the plan and was able to repeat key elements of the plan. All questions were answered to their satisfaction.  Raman Shahir Karen, DNP   I, Raman Demarian Epps have reviewed all documentation for this visit. The documentation on 02/12/21 for the exam, diagnosis, procedures, and  orders are all accurate and complete.    IF YOU HAVE BEEN REFERRED TO A SPECIALIST, IT MAY TAKE 1-2 WEEKS TO SCHEDULE/PROCESS THE REFERRAL. IF YOU HAVE NOT HEARD FROM US/SPECIALIST IN TWO WEEKS, PLEASE GIVE [place] A CALL AT [phone] X [phone].   THE PATIENT IS ENCOURAGED TO PRACTICE SOCIAL DISTANCING DUE TO THE COVID-19 PANDEMIC.

## 2021-03-03 NOTE — Patient Instructions (Signed)

## 2021-03-05 ENCOUNTER — Other Ambulatory Visit: Payer: Self-pay | Admitting: Obstetrics and Gynecology

## 2021-03-05 DIAGNOSIS — Z1231 Encounter for screening mammogram for malignant neoplasm of breast: Secondary | ICD-10-CM

## 2021-03-06 ENCOUNTER — Other Ambulatory Visit: Payer: Self-pay | Admitting: Nurse Practitioner

## 2021-03-06 DIAGNOSIS — N3001 Acute cystitis with hematuria: Secondary | ICD-10-CM

## 2021-03-06 LAB — URINE CULTURE

## 2021-03-06 MED ORDER — AMOXICILLIN-POT CLAVULANATE 875-125 MG PO TABS: 1.0000 | ORAL_TABLET | Freq: Two times a day (BID) | ORAL | 0 refills | Status: AC

## 2021-03-30 ENCOUNTER — Other Ambulatory Visit (HOSPITAL_COMMUNITY): Payer: Self-pay

## 2021-03-31 ENCOUNTER — Encounter: Payer: Self-pay | Admitting: Internal Medicine

## 2021-04-10 ENCOUNTER — Other Ambulatory Visit: Payer: Self-pay | Admitting: Internal Medicine

## 2021-04-13 ENCOUNTER — Encounter: Payer: Self-pay | Admitting: Internal Medicine

## 2021-04-14 ENCOUNTER — Other Ambulatory Visit: Payer: Self-pay | Admitting: Internal Medicine

## 2021-04-14 DIAGNOSIS — N390 Urinary tract infection, site not specified: Secondary | ICD-10-CM

## 2021-04-24 ENCOUNTER — Encounter: Payer: Self-pay | Admitting: Internal Medicine

## 2021-05-04 ENCOUNTER — Ambulatory Visit (INDEPENDENT_AMBULATORY_CARE_PROVIDER_SITE_OTHER): Payer: 59 | Admitting: Internal Medicine

## 2021-05-04 ENCOUNTER — Other Ambulatory Visit: Payer: Self-pay

## 2021-05-04 ENCOUNTER — Encounter: Payer: Self-pay | Admitting: Internal Medicine

## 2021-05-04 VITALS — BP 132/78 | HR 97 | Temp 98.8°F | Ht 65.0 in | Wt 183.2 lb

## 2021-05-04 DIAGNOSIS — N390 Urinary tract infection, site not specified: Secondary | ICD-10-CM | POA: Diagnosis not present

## 2021-05-04 DIAGNOSIS — I129 Hypertensive chronic kidney disease with stage 1 through stage 4 chronic kidney disease, or unspecified chronic kidney disease: Secondary | ICD-10-CM | POA: Diagnosis not present

## 2021-05-04 DIAGNOSIS — R82998 Other abnormal findings in urine: Secondary | ICD-10-CM

## 2021-05-04 DIAGNOSIS — E6609 Other obesity due to excess calories: Secondary | ICD-10-CM | POA: Diagnosis not present

## 2021-05-04 DIAGNOSIS — N182 Chronic kidney disease, stage 2 (mild): Secondary | ICD-10-CM

## 2021-05-04 DIAGNOSIS — Z6830 Body mass index (BMI) 30.0-30.9, adult: Secondary | ICD-10-CM

## 2021-05-04 LAB — POCT URINALYSIS DIPSTICK
Glucose, UA: NEGATIVE
Ketones, UA: 15
Nitrite, UA: NEGATIVE
Protein, UA: POSITIVE — AB
Spec Grav, UA: 1.02 (ref 1.010–1.025)
Urobilinogen, UA: 0.2 E.U./dL
pH, UA: 5 (ref 5.0–8.0)

## 2021-05-04 MED ORDER — WEGOVY 2.4 MG/0.75ML ~~LOC~~ SOAJ: 2.4000 mg | SUBCUTANEOUS | 3 refills | Status: DC

## 2021-05-04 NOTE — Patient Instructions (Signed)

## 2021-05-04 NOTE — Progress Notes (Signed)
I,Tianna Badgett,acting as a Education administrator for [name], MD.,have documented all relevant documentation on the behalf of [name], MD,as directed by  [name], MD while in the presence of [name], MD.  This visit occurred during the SARS-CoV-2 public health emergency.  Safety protocols were in place, including screening questions prior to the visit, additional usage of staff PPE, and extensive cleaning of exam room while observing appropriate contact time as indicated for disinfecting solutions.  Subjective:     Patient ID: [name] , female    DOB: [birth year] , 45 y.o.   MRN: [id number]   Chief Complaint  Patient presents with   Obesity        Hypertension    HPI  The patient is here today for a htn and obesity f/u. She reports compliance with meds. She has been using Wegovy for weight loss. She has not had any issues with the medication.   Hypertension This is a chronic problem. The current episode started more than 1 year ago. The problem is controlled. Pertinent negatives include no palpitations or shortness of breath. The current treatment provides moderate improvement.    Past Medical History:  Diagnosis Date   Arthritis    Diabetes mellitus    HTN (hypertension)    Hyperlipidemia    Pyelonephritis 09/30/11   Renal failure    S/P kidney transplant      Family History  Problem Relation Age of Onset   Diabetes Mother    Arthritis Mother    Arthritis Father    Prostate cancer Father    Hypertension Father    Breast cancer Maternal Aunt    Breast cancer Paternal Aunt      Current Outpatient Medications:    amLODipine (NORVASC) 5 MG tablet, TAKE 1 TABLET BY MOUTH EVERY DAY, Disp: 90 tablet, Rfl: 2   atorvastatin (LIPITOR) 10 MG tablet, Take 10 mg by mouth daily. , Disp: , Rfl:    BAQSIMI ONE PACK 3 MG/DOSE POWD, PLACE 3 MG INTO THE NOSE ONCE AS NEEDED FOR UP TO 1 DOSE., Disp: 1 each, Rfl: 6   BD PEN NEEDLE NANO 2ND GEN 32G X 4 MM MISC,  USE 3X A DAY, Disp: 300 each, Rfl: 3   Cholecalciferol (VITAMIN D3) 125 MCG (5000 UT) CAPS, Take by mouth daily., Disp: , Rfl:    Continuous Blood Gluc Receiver (FREESTYLE LIBRE 14 DAY READER) DEVI, Inject 1 each into the skin QID., Disp: 1 each, Rfl: 1   Continuous Blood Gluc Sensor (FREESTYLE LIBRE 2 SENSOR) MISC, 1 EACH BY DOES NOT APPLY ROUTE EVERY 14 (FOURTEEN) DAYS., Disp: 6 each, Rfl: 1   gabapentin (NEURONTIN) 300 MG capsule, Take 1 capsule (300 mg total) by mouth 3 (three) times daily., Disp: 90 capsule, Rfl: 3   Insulin Aspart, w/Niacinamide, (FIASP) 100 UNIT/ML SOLN, Use up to 60 units a day in the insulin pump, Disp: 60 mL, Rfl: 3   Insulin Disposable Pump (OMNIPOD DASH 5 PACK PODS) MISC, USE AS INSTRUCTED CHANGE EVERY 1.5 DAY, Disp: 135 each, Rfl: 0   levonorgestrel (MIRENA) 20 MCG/24HR IUD, by Intrauterine route., Disp: , Rfl:    LORazepam (ATIVAN) 0.5 MG tablet, Take by mouth. prn, Disp: , Rfl:    metFORMIN (GLUCOPHAGE) 500 MG tablet, TAKE 1 TABLET BY MOUTH 2 TIMES DAILY WITH A MEAL., Disp: 180 tablet, Rfl: 1   mycophenolate (CELLCEPT) 250 MG capsule, Take 500 mg every morning and 750 mg every evening, Disp: ,  Rfl:    pantoprazole (PROTONIX) 40 MG tablet, Take 40 mg by mouth 2 (two) times daily. , Disp: , Rfl:    predniSONE (DELTASONE) 5 MG tablet, Take 5 mg by mouth daily., Disp: , Rfl:    QUEtiapine (SEROQUEL) 50 MG tablet, Take 50 mg by mouth at bedtime., Disp: , Rfl:    Semaglutide-Weight Management (WEGOVY) 2.4 MG/0.75ML SOAJ, Inject 2.4 mg into the skin once a week., Disp: 3 mL, Rfl: 3   tacrolimus (PROGRAF) 0.5 MG capsule, Take 2.5 mg every morning and 3 mg every evening, Disp: , Rfl:    Allergies  Allergen Reactions   Adhesive [Tape] Other (See Comments)    "plastic tape" irritates skin   Ok with paper tape   Wound Dressing Adhesive     Other reaction(s): Other "plastic tape" irritates skin   Ok with paper tape     Review of Systems  Constitutional: Negative.    Respiratory: Negative.  Negative for shortness of breath.   Cardiovascular: Negative.  Negative for palpitations.  Gastrointestinal: Negative.   Neurological: Negative.     Today's Vitals   05/04/21 1448  BP: 132/78  Pulse: 97  Temp: 98.8 F (37.1 C)  TempSrc: Oral  Weight: 183 lb 3.2 oz (83.1 kg)  Height: 5\' 5"  (1.651 m)   Body mass index is 30.49 kg/m.  Wt Readings from Last 3 Encounters:  05/04/21 183 lb 3.2 oz (83.1 kg)  02/19/21 194 lb 6.4 oz (88.2 kg)  02/10/21 197 lb (89.4 kg)    Objective:  Physical Exam Vitals and nursing note reviewed.  Constitutional:      Appearance: Normal appearance.  HENT:     Head: Normocephalic and atraumatic.     Nose:     Comments: Masked     Mouth/Throat:     Comments: Masked  Cardiovascular:     Rate and Rhythm: Normal rate and regular rhythm.     Heart sounds: Normal heart sounds.  Pulmonary:     Effort: Pulmonary effort is normal.     Breath sounds: Normal breath sounds.  Musculoskeletal:     Cervical back: Normal range of motion.  Skin:    General: Skin is warm.  Neurological:     General: No focal deficit present.     Mental Status: She is alert.  Psychiatric:        Mood and Affect: Mood normal.        Behavior: Behavior normal.        Assessment And Plan:     1. Class 1 obesity due to excess calories with serious comorbidity and body mass index (BMI) of 30.0 to 30.9 in adult Comments: She was congratulated on her 9 lb. weight loss since May 2022. She will c/w [facility] 2.4mg  weekly. Advised to aim for at least 150 minutes of exercise/week.   2. Hypertensive nephropathy Comments: Controlled. She is aware goal BP is less than 130/80. Encouraged to follow low sodium diet.   3. Recurrent UTI Comments: I will check urinalysis. She also requests Urology referral.  - POCT Urinalysis Dipstick (81002)  4. Leukocytes in urine Comments: I will check urine culture and treat accordingly.  - Culture, Urine   Patient was  given opportunity to ask questions. Patient verbalized understanding of the plan and was able to repeat key elements of the plan. All questions were answered to their satisfaction.   I, [name], MD, have reviewed all documentation for this visit. The documentation on 05/24/21  for the exam, diagnosis, procedures, and orders are all accurate and complete.   IF YOU HAVE BEEN REFERRED TO A SPECIALIST, IT MAY TAKE 1-2 WEEKS TO SCHEDULE/PROCESS THE REFERRAL. IF YOU HAVE NOT HEARD FROM US/SPECIALIST IN TWO WEEKS, PLEASE GIVE [place] A CALL AT [phone] X [phone].   THE PATIENT IS ENCOURAGED TO PRACTICE SOCIAL DISTANCING DUE TO THE COVID-19 PANDEMIC.

## 2021-05-05 ENCOUNTER — Ambulatory Visit
Admission: RE | Admit: 2021-05-05 | Discharge: 2021-05-05 | Disposition: A | Discharge status: Home / Self Care | Payer: 59 | Source: Ambulatory Visit | Attending: Obstetrics and Gynecology | Admitting: Obstetrics and Gynecology

## 2021-05-05 DIAGNOSIS — Z1231 Encounter for screening mammogram for malignant neoplasm of breast: Secondary | ICD-10-CM

## 2021-05-07 ENCOUNTER — Other Ambulatory Visit: Payer: Self-pay | Admitting: Internal Medicine

## 2021-05-07 LAB — URINE CULTURE

## 2021-05-07 MED ORDER — CEPHALEXIN 500 MG PO CAPS: 500.0000 mg | ORAL_CAPSULE | Freq: Two times a day (BID) | ORAL | 0 refills | Status: AC

## 2021-05-15 ENCOUNTER — Other Ambulatory Visit: Payer: Self-pay | Admitting: Internal Medicine

## 2021-05-18 ENCOUNTER — Encounter: Payer: Self-pay | Admitting: Internal Medicine

## 2021-05-18 DIAGNOSIS — E6609 Other obesity due to excess calories: Secondary | ICD-10-CM

## 2021-05-18 DIAGNOSIS — N182 Chronic kidney disease, stage 2 (mild): Secondary | ICD-10-CM

## 2021-05-18 DIAGNOSIS — E1122 Type 2 diabetes mellitus with diabetic chronic kidney disease: Secondary | ICD-10-CM

## 2021-05-18 MED ORDER — FREESTYLE LIBRE 2 SENSOR MISC: 1 refills | Status: DC

## 2021-05-24 DIAGNOSIS — I129 Hypertensive chronic kidney disease with stage 1 through stage 4 chronic kidney disease, or unspecified chronic kidney disease: Secondary | ICD-10-CM | POA: Insufficient documentation

## 2021-05-25 MED ORDER — DEXCOM G6 RECEIVER DEVI: 0 refills | Status: DC

## 2021-05-25 MED ORDER — DEXCOM G6 SENSOR MISC: 3 refills | Status: DC

## 2021-05-25 MED ORDER — DEXCOM G6 TRANSMITTER MISC
3 refills | Status: DC
Start: 2021-05-25 — End: 2022-02-18

## 2021-05-25 NOTE — Telephone Encounter (Signed)
Pt calling in stating that pharmacy is stating that the Dexcom needs a PA. Pt would like a call when this completed.

## 2021-05-25 NOTE — Addendum Note (Signed)
Addended by: Lauralyn Primes on: 05/25/2021 10:32 AM   Modules accepted: Orders

## 2021-05-27 ENCOUNTER — Telehealth: Payer: Self-pay | Admitting: Internal Medicine

## 2021-05-27 NOTE — Telephone Encounter (Signed)
[name] is calling to check on a PA for Pt  [phone] ext [phone]

## 2021-05-28 ENCOUNTER — Other Ambulatory Visit (HOSPITAL_COMMUNITY): Payer: Self-pay

## 2021-05-28 ENCOUNTER — Telehealth: Payer: Self-pay | Admitting: Pharmacy Technician

## 2021-05-28 NOTE — Telephone Encounter (Signed)
Patient Advocate Encounter   Received notification from PT/CMA that prior authorization for Dexcom is required.   PA submitted on 05/28/21 Key [id number] Status is Approved from today through 05/28/22   Armanda Magic, [facility] Patient [facility] Phone: [phone] Fax:  [phone]

## 2021-05-28 NOTE — Telephone Encounter (Signed)
Pt notified via Mychart message.  

## 2021-07-02 ENCOUNTER — Other Ambulatory Visit: Payer: Self-pay | Admitting: Podiatry

## 2021-07-03 NOTE — Telephone Encounter (Signed)
Please Advise

## 2021-07-15 ENCOUNTER — Other Ambulatory Visit: Payer: Self-pay

## 2021-07-15 ENCOUNTER — Ambulatory Visit (INDEPENDENT_AMBULATORY_CARE_PROVIDER_SITE_OTHER): Payer: 59 | Admitting: Internal Medicine

## 2021-07-15 ENCOUNTER — Encounter: Payer: Self-pay | Admitting: Internal Medicine

## 2021-07-15 VITALS — BP 132/72 | HR 70 | Temp 98.1°F | Ht 63.0 in | Wt 171.4 lb

## 2021-07-15 DIAGNOSIS — N182 Chronic kidney disease, stage 2 (mild): Secondary | ICD-10-CM | POA: Diagnosis not present

## 2021-07-15 DIAGNOSIS — Z794 Long term (current) use of insulin: Secondary | ICD-10-CM

## 2021-07-15 DIAGNOSIS — Z23 Encounter for immunization: Secondary | ICD-10-CM

## 2021-07-15 DIAGNOSIS — E6609 Other obesity due to excess calories: Secondary | ICD-10-CM | POA: Diagnosis not present

## 2021-07-15 DIAGNOSIS — I129 Hypertensive chronic kidney disease with stage 1 through stage 4 chronic kidney disease, or unspecified chronic kidney disease: Secondary | ICD-10-CM

## 2021-07-15 DIAGNOSIS — Z940 Kidney transplant status: Secondary | ICD-10-CM

## 2021-07-15 DIAGNOSIS — E1122 Type 2 diabetes mellitus with diabetic chronic kidney disease: Secondary | ICD-10-CM

## 2021-07-15 DIAGNOSIS — Z6830 Body mass index (BMI) 30.0-30.9, adult: Secondary | ICD-10-CM

## 2021-07-15 NOTE — Patient Instructions (Signed)

## 2021-07-15 NOTE — Progress Notes (Signed)
Earleen Newport as a Education administrator for [name], MD.,have documented all relevant documentation on the behalf of [name], MD,as directed by  [name], MD while in the presence of [name], MD.   This visit occurred during the SARS-CoV-2 public health emergency.  Safety protocols were in place, including screening questions prior to the visit, additional usage of staff PPE, and extensive cleaning of exam room while observing appropriate contact time as indicated for disinfecting solutions.  Subjective:     Patient ID: [name] , female    DOB: 16-Jan-1976 , 45 y.o.   MRN: [id number]   Chief Complaint  Patient presents with   Hypertension    HPI  The patient is here today for a htn and obesity f/u. She reports compliance with meds. She has been using [place] 2.4mg  weekly for weight loss. She has not had any issues with the medication.   Hypertension This is a chronic problem. The current episode started more than 1 year ago. The problem is controlled. Pertinent negatives include no palpitations or shortness of breath. Risk factors for coronary artery disease include diabetes mellitus, dyslipidemia and obesity. The current treatment provides moderate improvement. Hypertensive end-organ damage includes kidney disease. Identifiable causes of hypertension include chronic renal disease.    Past Medical History:  Diagnosis Date   Arthritis    Diabetes mellitus    HTN (hypertension)    Hyperlipidemia    Pyelonephritis 09/30/11   Renal failure    S/P kidney transplant      Family History  Problem Relation Age of Onset   Diabetes Mother    Arthritis Mother    Arthritis Father    Prostate cancer Father    Hypertension Father    Breast cancer Maternal Aunt    Breast cancer Paternal Aunt      Current Outpatient Medications:    amLODipine (NORVASC) 5 MG tablet, TAKE 1 TABLET BY MOUTH EVERY DAY, Disp: 90 tablet, Rfl: 2   atorvastatin (LIPITOR) 10 MG  tablet, Take 10 mg by mouth daily. , Disp: , Rfl:    BAQSIMI ONE PACK 3 MG/DOSE POWD, PLACE 3 MG INTO THE NOSE ONCE AS NEEDED FOR UP TO 1 DOSE., Disp: 1 each, Rfl: 6   BD PEN NEEDLE NANO 2ND GEN 32G X 4 MM MISC, USE 3X A DAY, Disp: 300 each, Rfl: 3   Cholecalciferol (VITAMIN D3) 125 MCG (5000 UT) CAPS, Take by mouth daily., Disp: , Rfl:    Continuous Blood Gluc Receiver ([place]) DEVI, Use as instructed to check blood sugar 4 times daily, Disp: 1 each, Rfl: 0   Continuous Blood Gluc Sensor (DEXCOM G6 SENSOR) MISC, Use as instructed change every 10 days, Disp: 9 each, Rfl: 3   Continuous Blood Gluc Transmit (DEXCOM G6 TRANSMITTER) MISC, Use as instructed change every 90 days, Disp: 1 each, Rfl: 3   gabapentin (NEURONTIN) 300 MG capsule, TAKE 1 CAPSULE BY MOUTH THREE TIMES A DAY, Disp: 90 capsule, Rfl: 3   Insulin Aspart, w/Niacinamide, (FIASP) 100 UNIT/ML SOLN, Use up to 60 units a day in the insulin pump, Disp: 60 mL, Rfl: 3   Insulin Disposable Pump (OMNIPOD 5 G6 POD, GEN 5,) MISC, 1 each by Does not apply route every 3 (three) days., Disp: 30 each, Rfl: 3   levonorgestrel (MIRENA) 20 MCG/24HR IUD, by Intrauterine route., Disp: , Rfl:    LORazepam (ATIVAN) 0.5 MG tablet, Take by mouth. prn, Disp: , Rfl:  metFORMIN (GLUCOPHAGE) 500 MG tablet, TAKE 1 TABLET BY MOUTH 2 TIMES DAILY WITH A MEAL., Disp: 180 tablet, Rfl: 1   mycophenolate (CELLCEPT) 250 MG capsule, Take 500 mg every morning and 750 mg every evening, Disp: , Rfl:    pantoprazole (PROTONIX) 40 MG tablet, Take 40 mg by mouth 2 (two) times daily. , Disp: , Rfl:    predniSONE (DELTASONE) 5 MG tablet, Take 5 mg by mouth daily., Disp: , Rfl:    QUEtiapine (SEROQUEL) 50 MG tablet, Take 50 mg by mouth at bedtime., Disp: , Rfl:    Semaglutide-Weight Management (WEGOVY) 2.4 MG/0.75ML SOAJ, Inject 2.4 mg into the skin once a week., Disp: 3 mL, Rfl: 3   tacrolimus (PROGRAF) 0.5 MG capsule, Take 2.5 mg every morning and 3 mg every  evening, Disp: , Rfl:    Allergies  Allergen Reactions   Adhesive [Tape] Other (See Comments)    "plastic tape" irritates skin   Ok with paper tape   Wound Dressing Adhesive     Other reaction(s): Other "plastic tape" irritates skin   Ok with paper tape     Review of Systems  Constitutional: Negative.   Respiratory: Negative.  Negative for shortness of breath.   Cardiovascular: Negative.  Negative for palpitations.  Neurological: Negative.   Psychiatric/Behavioral: Negative.      Today's Vitals   07/15/21 1445  BP: 132/72  Pulse: 70  Temp: 98.1 F (36.7 C)  TempSrc: Oral  Weight: 171 lb 6.4 oz (77.7 kg)  Height: 5\' 3"  (1.6 m)   Body mass index is 30.36 kg/m.  Wt Readings from Last 3 Encounters:  07/17/21 173 lb 3.2 oz (78.6 kg)  07/15/21 171 lb 6.4 oz (77.7 kg)  05/04/21 183 lb 3.2 oz (83.1 kg)    Objective:  Physical Exam Vitals and nursing note reviewed.  Constitutional:      Appearance: Normal appearance. She is obese.  HENT:     Head: Normocephalic and atraumatic.     Nose:     Comments: Masked     Mouth/Throat:     Comments: Masked  Eyes:     Extraocular Movements: Extraocular movements intact.  Cardiovascular:     Rate and Rhythm: Normal rate and regular rhythm.     Heart sounds: Normal heart sounds.  Pulmonary:     Effort: Pulmonary effort is normal.     Breath sounds: Normal breath sounds.  Musculoskeletal:     Cervical back: Normal range of motion.  Skin:    General: Skin is warm.  Neurological:     General: No focal deficit present.     Mental Status: She is alert.  Psychiatric:        Mood and Affect: Mood normal.        Behavior: Behavior normal.        Assessment And Plan:     1. Hypertensive nephropathy Comments: Chronic, fair control. Goal BP is less than 130/80. Encouraged to follow low sodium diet.  - Liver Profile  2. Class 1 obesity due to excess calories with serious comorbidity and body mass index (BMI) of 30.0 to 30.9 in  adult Comments: She is doing great with [facility] 2.4mg  weekly.  She will f/u in 8-10 weeks for re-evaluation. Reminded to aim for at least 150 minutes of exercise per week.   3. Type 2 diabetes mellitus with stage 2 chronic kidney disease, with long-term current use of insulin (HCC) Comments: Chronic, I will check an a1c today. I  appreciate input from Endo, Dr. Cruzita Lederer.  - Hemoglobin A1c - Liver Profile  4. Need for influenza vaccination Comments: She was given flu vaccine to update her immunzation history.  - Flu Vaccine QUAD 6+ mos PF IM (Fluarix Quad PF)  5. Kidney transplant status  Patient was given opportunity to ask questions. Patient verbalized understanding of the plan and was able to repeat key elements of the plan. All questions were answered to their satisfaction.   I, [name], MD, have reviewed all documentation for this visit. The documentation on 07/15/21 for the exam, diagnosis, procedures, and orders are all accurate and complete.   IF YOU HAVE BEEN REFERRED TO A SPECIALIST, IT MAY TAKE 1-2 WEEKS TO SCHEDULE/PROCESS THE REFERRAL. IF YOU HAVE NOT HEARD FROM US/SPECIALIST IN TWO WEEKS, PLEASE GIVE [place] A CALL AT [phone].   THE PATIENT IS ENCOURAGED TO PRACTICE SOCIAL DISTANCING DUE TO THE COVID-19 PANDEMIC.

## 2021-07-16 LAB — HEPATIC FUNCTION PANEL
ALT: 14 IU/L (ref 0–32)
AST: 13 IU/L (ref 0–40)
Albumin: 4.2 g/dL (ref 3.8–4.8)
Alkaline Phosphatase: 91 IU/L (ref 44–121)
Bilirubin Total: 0.3 mg/dL (ref 0.0–1.2)
Bilirubin, Direct: 0.14 mg/dL (ref 0.00–0.40)
Total Protein: 6.4 g/dL (ref 6.0–8.5)

## 2021-07-16 LAB — HEMOGLOBIN A1C
Est. average glucose Bld gHb Est-mCnc: 128 mg/dL
Hgb A1c MFr Bld: 6.1 % — ABNORMAL HIGH (ref 4.8–5.6)

## 2021-07-17 ENCOUNTER — Encounter: Payer: Self-pay | Admitting: Internal Medicine

## 2021-07-17 ENCOUNTER — Other Ambulatory Visit: Payer: Self-pay

## 2021-07-17 ENCOUNTER — Ambulatory Visit (INDEPENDENT_AMBULATORY_CARE_PROVIDER_SITE_OTHER): Payer: 59 | Admitting: Internal Medicine

## 2021-07-17 ENCOUNTER — Other Ambulatory Visit: Payer: Self-pay | Admitting: Internal Medicine

## 2021-07-17 ENCOUNTER — Other Ambulatory Visit (HOSPITAL_COMMUNITY): Payer: Self-pay

## 2021-07-17 VITALS — BP 110/72 | HR 79 | Ht 63.0 in | Wt 173.2 lb

## 2021-07-17 DIAGNOSIS — N182 Chronic kidney disease, stage 2 (mild): Secondary | ICD-10-CM

## 2021-07-17 DIAGNOSIS — E6609 Other obesity due to excess calories: Secondary | ICD-10-CM

## 2021-07-17 DIAGNOSIS — Z794 Long term (current) use of insulin: Secondary | ICD-10-CM | POA: Diagnosis not present

## 2021-07-17 DIAGNOSIS — E1169 Type 2 diabetes mellitus with other specified complication: Secondary | ICD-10-CM | POA: Diagnosis not present

## 2021-07-17 DIAGNOSIS — E785 Hyperlipidemia, unspecified: Secondary | ICD-10-CM

## 2021-07-17 DIAGNOSIS — E1122 Type 2 diabetes mellitus with diabetic chronic kidney disease: Secondary | ICD-10-CM

## 2021-07-17 DIAGNOSIS — Z6830 Body mass index (BMI) 30.0-30.9, adult: Secondary | ICD-10-CM

## 2021-07-17 MED ORDER — OMNIPOD 5 DEXG7G6 PODS GEN 5 MISC: 1.0000 | 3 refills | Status: DC

## 2021-07-17 MED ORDER — OMNIPOD 5 DEXG7G6 INTRO GEN 5 KIT: 1.0000 | PACK | Freq: Once | 0 refills | Status: AC

## 2021-07-17 NOTE — Patient Instructions (Signed)
Please continue: - Wegovy 2.4 mg weekly  Please use the following insulin pump settings: - basal rates: 12 am: 0.75 units/h  - ICR: 1:8  - target: 100 - ISF: 30 - Active insulin time: 4 hours - bolus wizard: on  If you bolus too much for a meal, may need to eat few glucose tabs afterwards.  Try to get the Omnipod 5.  Schedule an appt with Leonia Reader [phone].  Please return in 3-4 months.

## 2021-07-17 NOTE — Telephone Encounter (Signed)
Submitted test claim for Omnipod 5 G6. This item does not require a PA, this is a Lexicographer Not Covered - Plan/Benefit Exclusion.
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
## 2021-07-20 NOTE — Telephone Encounter (Signed)
[name], I advised [name] to have an appointment with you to switch to the OmniPod 5, however, it appears that this is not covered by her insurance.  Is there any way that we can go around this?  I would love for this to integrate with the Dexcom CGM for her. Thank you, C

## 2021-08-31 NOTE — Telephone Encounter (Signed)
Patient reports that she wants to do nothing about this denial for her pump, because she is getting new insurance the first of the year. She was told to call use, after the first, and that we can send it to her qualifying pharmacy with her new diagnosis of Type I diabetes, and it should be covered.  She agreed to do this.

## 2021-09-03 ENCOUNTER — Ambulatory Visit (INDEPENDENT_AMBULATORY_CARE_PROVIDER_SITE_OTHER): Payer: BC Managed Care – PPO | Admitting: Nurse Practitioner

## 2021-09-03 ENCOUNTER — Other Ambulatory Visit: Payer: Self-pay

## 2021-09-03 ENCOUNTER — Telehealth: Payer: Self-pay

## 2021-09-03 ENCOUNTER — Encounter: Payer: Self-pay | Admitting: Nurse Practitioner

## 2021-09-03 VITALS — BP 120/78 | HR 80 | Temp 98.3°F | Ht 63.0 in | Wt 180.4 lb

## 2021-09-03 DIAGNOSIS — T148XXA Other injury of unspecified body region, initial encounter: Secondary | ICD-10-CM | POA: Diagnosis not present

## 2021-09-03 NOTE — Progress Notes (Signed)
I,Katawbba Wiggins,acting as a Education administrator for [employer], FNP.,have documented all relevant documentation on the behalf of Minette Brine, FNP,as directed by  Minette Brine, FNP while in the presence of Minette Brine, [place].   This visit occurred during the SARS-CoV-2 public health emergency.  Safety protocols were in place, including screening questions prior to the visit, additional usage of staff PPE, and extensive cleaning of exam room while observing appropriate contact time as indicated for disinfecting solutions.  Subjective:     Patient ID: [name] , female    DOB: [birth year] , 45 y.o.   MRN: [id number]   Chief Complaint  Patient presents with   glass in right foot    HPI  The patient is here today for evaluation of possible glass in her right foot. She feels like there is a piece of glass in her right foot. This was a week ago, she was able to get the glass out initially. Reports her blood sugar has been normal.     Past Medical History:  Diagnosis Date   Arthritis    Diabetes mellitus    HTN (hypertension)    Hyperlipidemia    Pyelonephritis 09/30/11   Renal failure    S/P kidney transplant      Family History  Problem Relation Age of Onset   Diabetes Mother    Arthritis Mother    Arthritis Father    Prostate cancer Father    Hypertension Father    Breast cancer Maternal Aunt    Breast cancer Paternal Aunt      Current Outpatient Medications:    amLODipine (NORVASC) 5 MG tablet, TAKE 1 TABLET BY MOUTH EVERY DAY, Disp: 90 tablet, Rfl: 2   atorvastatin (LIPITOR) 10 MG tablet, Take 10 mg by mouth daily. , Disp: , Rfl:    BAQSIMI ONE PACK 3 MG/DOSE POWD, PLACE 3 MG INTO THE NOSE ONCE AS NEEDED FOR UP TO 1 DOSE., Disp: 1 each, Rfl: 6   BD PEN NEEDLE NANO 2ND GEN 32G X 4 MM MISC, USE 3X A DAY, Disp: 300 each, Rfl: 3   Cholecalciferol (VITAMIN D3) 125 MCG (5000 UT) CAPS, Take by mouth daily., Disp: , Rfl:    Continuous Blood Gluc Receiver ([place]) DEVI,  Use as instructed to check blood sugar 4 times daily, Disp: 1 each, Rfl: 0   Continuous Blood Gluc Sensor (DEXCOM G6 SENSOR) MISC, Use as instructed change every 10 days, Disp: 9 each, Rfl: 3   Continuous Blood Gluc Transmit (DEXCOM G6 TRANSMITTER) MISC, Use as instructed change every 90 days, Disp: 1 each, Rfl: 3   gabapentin (NEURONTIN) 300 MG capsule, TAKE 1 CAPSULE BY MOUTH THREE TIMES A DAY, Disp: 90 capsule, Rfl: 3   Insulin Aspart, w/Niacinamide, (FIASP) 100 UNIT/ML SOLN, Use up to 60 units a day in the insulin pump, Disp: 60 mL, Rfl: 3   Insulin Disposable Pump (OMNIPOD 5 G6 POD, GEN 5,) MISC, 1 each by Does not apply route every 3 (three) days., Disp: 30 each, Rfl: 3   levonorgestrel (MIRENA) 20 MCG/24HR IUD, by Intrauterine route., Disp: , Rfl:    LORazepam (ATIVAN) 0.5 MG tablet, Take by mouth. prn, Disp: , Rfl:    metFORMIN (GLUCOPHAGE) 500 MG tablet, TAKE 1 TABLET BY MOUTH 2 TIMES DAILY WITH A MEAL., Disp: 180 tablet, Rfl: 1   mycophenolate (CELLCEPT) 250 MG capsule, Take 500 mg every morning and 750 mg every evening, Disp: , Rfl:    pantoprazole (PROTONIX) 40  MG tablet, Take 40 mg by mouth 2 (two) times daily. , Disp: , Rfl:    predniSONE (DELTASONE) 5 MG tablet, Take 5 mg by mouth daily., Disp: , Rfl:    QUEtiapine (SEROQUEL) 50 MG tablet, Take 50 mg by mouth at bedtime., Disp: , Rfl:    Semaglutide-Weight Management (WEGOVY) 2.4 MG/0.75ML SOAJ, Inject 2.4 mg into the skin once a week., Disp: 3 mL, Rfl: 3   tacrolimus (PROGRAF) 0.5 MG capsule, Take 2.5 mg every morning and 3 mg every evening, Disp: , Rfl:    Allergies  Allergen Reactions   Adhesive [Tape] Other (See Comments)    "plastic tape" irritates skin   Ok with paper tape   Wound Dressing Adhesive     Other reaction(s): Other "plastic tape" irritates skin   Ok with paper tape     Review of Systems  Constitutional: Negative.   Respiratory: Negative.    Cardiovascular: Negative.   Skin:        Right foot with  possible glass  Psychiatric/Behavioral: Negative.      Today's Vitals   09/03/21 1222  BP: 120/78  Pulse: 80  Temp: 98.3 F (36.8 C)  Weight: 180 lb 6.4 oz (81.8 kg)  Height: 5\' 3"  (1.6 m)   Body mass index is 31.96 kg/m.   Objective:  Physical Exam Vitals reviewed.  Constitutional:      Appearance: Normal appearance.  Skin:    Comments: Right foot at ball area with firm area when rubbed  Neurological:     General: No focal deficit present.     Mental Status: She is alert and oriented to person, place, and time.  Psychiatric:        Mood and Affect: Mood normal.        Behavior: Behavior normal.        Thought Content: Thought content normal.        Judgment: Judgment normal.        Assessment And Plan:     1. Superficial foreign body (sliver) Comments: Will call [facility] to see if they can remove, there is a slightly raised area and tenderness to plantar surface of right foot, Tetanus is up to date    Patient was given opportunity to ask questions. Patient verbalized understanding of the plan and was able to repeat key elements of the plan. All questions were answered to their satisfaction.  Minette Brine, FNP   I, Minette Brine, FNP, have reviewed all documentation for this visit. The documentation on 09/10/21 for the exam, diagnosis, procedures, and orders are all accurate and complete.   IF YOU HAVE BEEN REFERRED TO A SPECIALIST, IT MAY TAKE 1-2 WEEKS TO SCHEDULE/PROCESS THE REFERRAL. IF YOU HAVE NOT HEARD FROM US/SPECIALIST IN TWO WEEKS, PLEASE GIVE [place] A CALL AT [phone].   THE PATIENT IS ENCOURAGED TO PRACTICE SOCIAL DISTANCING DUE TO THE COVID-19 PANDEMIC.

## 2021-09-03 NOTE — Telephone Encounter (Signed)
The pt was notified that Laurance Flatten, DNP, FNP=BC said to contact her podiatrist to have the glass removed from her foot.

## 2021-09-04 DIAGNOSIS — F4323 Adjustment disorder with mixed anxiety and depressed mood: Secondary | ICD-10-CM | POA: Diagnosis not present

## 2021-09-04 DIAGNOSIS — F321 Major depressive disorder, single episode, moderate: Secondary | ICD-10-CM | POA: Diagnosis not present

## 2021-09-04 DIAGNOSIS — G4700 Insomnia, unspecified: Secondary | ICD-10-CM | POA: Diagnosis not present

## 2021-09-07 DIAGNOSIS — Z940 Kidney transplant status: Secondary | ICD-10-CM | POA: Diagnosis not present

## 2021-09-08 ENCOUNTER — Ambulatory Visit: Payer: BC Managed Care – PPO | Admitting: Podiatry

## 2021-09-08 ENCOUNTER — Encounter: Payer: Self-pay | Admitting: Internal Medicine

## 2021-10-02 ENCOUNTER — Other Ambulatory Visit: Payer: Self-pay | Admitting: Internal Medicine

## 2021-10-04 HISTORY — PX: REDUCTION MAMMAPLASTY: SUR839

## 2021-10-07 ENCOUNTER — Encounter: Payer: Self-pay | Admitting: Internal Medicine

## 2021-10-07 DIAGNOSIS — E1122 Type 2 diabetes mellitus with diabetic chronic kidney disease: Secondary | ICD-10-CM

## 2021-10-07 DIAGNOSIS — N182 Chronic kidney disease, stage 2 (mild): Secondary | ICD-10-CM

## 2021-10-07 MED ORDER — TIRZEPATIDE 5 MG/0.5ML ~~LOC~~ SOAJ: 5.0000 mg | SUBCUTANEOUS | 0 refills | Status: DC

## 2021-10-08 ENCOUNTER — Other Ambulatory Visit (HOSPITAL_COMMUNITY): Payer: Self-pay

## 2021-10-09 ENCOUNTER — Other Ambulatory Visit (HOSPITAL_COMMUNITY): Payer: Self-pay

## 2021-10-09 ENCOUNTER — Telehealth: Payer: Self-pay

## 2021-10-09 NOTE — Telephone Encounter (Signed)
Patient Advocate Encounter   Received notification from patient calls that prior authorization for [name] is required by his/her insurance [employer].   PA submitted on 10/09/21  Key#: [id number]  Status is pending    [facility] will continue to follow:  Patient Advocate Fax: [phone]

## 2021-10-13 NOTE — Telephone Encounter (Signed)
Is it possible to appeal this for [facility] mentioning that she has tried [place] and Mancel Parsons is not working for her anymore.  Rybelsus would be extremely weak and she will not respond to this if she is not responding to [facility].  Trulicity is also weaker than Wegovy.

## 2021-10-13 NOTE — Telephone Encounter (Addendum)
Patient Advocate Encounter  Received notification from [place] that the request for prior authorization for [name] has been denied due to the patient not trying the alt., Ozempic, Rybelsus, Trulicity or Victoza.     Faxed PA appeal to [email or web address] [phone] on 10/14/21.  This encounter will continue to be updated until final determination.     Specialty Pharmacy Patient Advocate Fax: [phone]

## 2021-10-14 ENCOUNTER — Other Ambulatory Visit (HOSPITAL_COMMUNITY): Payer: Self-pay

## 2021-10-16 ENCOUNTER — Other Ambulatory Visit: Payer: Self-pay | Admitting: Internal Medicine

## 2021-10-20 ENCOUNTER — Other Ambulatory Visit (HOSPITAL_COMMUNITY): Payer: Self-pay

## 2021-10-21 NOTE — Telephone Encounter (Signed)
Called [facility] [employer] to f/u on PA appeal.  They have not made a decision yet.

## 2021-10-22 ENCOUNTER — Encounter: Payer: Self-pay | Admitting: Internal Medicine

## 2021-10-22 ENCOUNTER — Other Ambulatory Visit (HOSPITAL_COMMUNITY): Payer: Self-pay

## 2021-10-22 NOTE — Telephone Encounter (Signed)
In that case, she needs to use the [facility] coupon that she can get it from the website.   She was previously on Wegovy so I would not suggest to go back to lower doses of GLP-1 receptor agonist.

## 2021-10-22 NOTE — Telephone Encounter (Signed)
Patient Advocate Encounter  Received notification from [facility] [employer] that the request for appeal prior authorization for [name] has been denied due to not meeting medically necessary requirements.   Current plan criteria only allow coverage of [place]jaro if the pt is unable to take the required number of formulary alternatives for the given dx due to an inadequate treatment response, intolerance or contraindication.  Formulary alternatives are: Ozempic, Rybelsus, Trulicity, Victoza.

## 2021-10-23 NOTE — Telephone Encounter (Signed)
Attempted to contact the rep Amber regarding next steps of patient PA denial. LVM for a call back as soon as possible

## 2021-10-27 ENCOUNTER — Encounter: Payer: Self-pay | Admitting: Internal Medicine

## 2021-10-27 NOTE — Telephone Encounter (Signed)
Called and advised pt of determination. Pt advised she has tried Ozempic and Victoza. She will follow up with Dr Pincus Badder office for further assistance.

## 2021-10-27 NOTE — Telephone Encounter (Signed)
Called and advised pt of determination. Pt advised she will follow up with Dr Baird Cancer since she was previously prescribed Ozempic and Victoza.

## 2021-10-29 ENCOUNTER — Telehealth: Payer: Self-pay

## 2021-10-29 NOTE — Telephone Encounter (Signed)
PA sent to plan for [facility]

## 2021-10-30 ENCOUNTER — Encounter: Payer: Self-pay | Admitting: Internal Medicine

## 2021-10-30 ENCOUNTER — Other Ambulatory Visit: Payer: Self-pay | Admitting: Internal Medicine

## 2021-11-02 ENCOUNTER — Other Ambulatory Visit (HOSPITAL_COMMUNITY): Payer: Self-pay

## 2021-11-05 ENCOUNTER — Other Ambulatory Visit: Payer: Self-pay | Admitting: Podiatry

## 2021-11-13 ENCOUNTER — Encounter: Payer: Self-pay | Admitting: Internal Medicine

## 2021-11-16 ENCOUNTER — Other Ambulatory Visit: Payer: Self-pay

## 2021-11-16 DIAGNOSIS — I739 Peripheral vascular disease, unspecified: Secondary | ICD-10-CM

## 2021-11-17 ENCOUNTER — Other Ambulatory Visit (HOSPITAL_COMMUNITY): Payer: Self-pay

## 2021-11-20 ENCOUNTER — Other Ambulatory Visit: Payer: Self-pay

## 2021-11-20 ENCOUNTER — Encounter: Payer: Self-pay | Admitting: Internal Medicine

## 2021-11-20 ENCOUNTER — Ambulatory Visit (INDEPENDENT_AMBULATORY_CARE_PROVIDER_SITE_OTHER): Payer: 59 | Admitting: Internal Medicine

## 2021-11-20 VITALS — BP 128/80 | HR 71 | Ht 63.0 in | Wt 179.8 lb

## 2021-11-20 DIAGNOSIS — E785 Hyperlipidemia, unspecified: Secondary | ICD-10-CM

## 2021-11-20 DIAGNOSIS — E6609 Other obesity due to excess calories: Secondary | ICD-10-CM

## 2021-11-20 DIAGNOSIS — N182 Chronic kidney disease, stage 2 (mild): Secondary | ICD-10-CM

## 2021-11-20 DIAGNOSIS — Z6830 Body mass index (BMI) 30.0-30.9, adult: Secondary | ICD-10-CM

## 2021-11-20 DIAGNOSIS — Z794 Long term (current) use of insulin: Secondary | ICD-10-CM

## 2021-11-20 DIAGNOSIS — E1169 Type 2 diabetes mellitus with other specified complication: Secondary | ICD-10-CM

## 2021-11-20 DIAGNOSIS — E1122 Type 2 diabetes mellitus with diabetic chronic kidney disease: Secondary | ICD-10-CM

## 2021-11-20 LAB — POCT GLYCOSYLATED HEMOGLOBIN (HGB A1C): Hemoglobin A1C: 5.7 % — AB (ref 4.0–5.6)

## 2021-11-20 MED ORDER — OMNIPOD 5 DEXG7G6 INTRO GEN 5 KIT: 1.0000 | PACK | Freq: Once | 0 refills | Status: AC

## 2021-11-20 NOTE — Patient Instructions (Addendum)
Please continue: - Wegovy 2.4 mg weekly  Please use the following insulin pump settings: - basal rates: 12 am: 0.75 units/h  - ICR: 1:8 >> 1:10 - target: 100 - ISF: 30 - Active insulin time: 4 hours - bolus wizard: on  Please return in 4 months.
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
## 2021-11-23 ENCOUNTER — Ambulatory Visit (HOSPITAL_COMMUNITY)
Admission: RE | Admit: 2021-11-23 | Discharge: 2021-11-23 | Disposition: A | Discharge status: Home / Self Care | Payer: 59 | Source: Ambulatory Visit | Attending: Surgery | Admitting: Surgery

## 2021-11-23 ENCOUNTER — Other Ambulatory Visit: Payer: Self-pay

## 2021-11-23 ENCOUNTER — Encounter: Payer: Self-pay | Admitting: Surgery

## 2021-11-23 ENCOUNTER — Ambulatory Visit (INDEPENDENT_AMBULATORY_CARE_PROVIDER_SITE_OTHER): Payer: 59 | Admitting: Surgery

## 2021-11-23 VITALS — BP 156/85 | HR 85 | Temp 97.8°F | Resp 20 | Ht 63.0 in | Wt 179.0 lb

## 2021-11-23 DIAGNOSIS — I70213 Atherosclerosis of native arteries of extremities with intermittent claudication, bilateral legs: Secondary | ICD-10-CM | POA: Diagnosis not present

## 2021-11-23 DIAGNOSIS — I739 Peripheral vascular disease, unspecified: Secondary | ICD-10-CM | POA: Insufficient documentation

## 2021-11-23 NOTE — Progress Notes (Signed)
[facility] [place]  Patient name: [name] MRN: [id number] DOB: Aug 02, 1976 Sex: female   REQUESTING PROVIDER:    Dr. Gladstone Lighter   REASON FOR CONSULT:    Calcified femoral arteries   HISTORY OF PRESENT ILLNESS:   [name] is a 46 y.o. female, who is referred for evaluation of femoral artery calcification.  This was detected when she was undergoing x-rays of both hips.  She states that for the past several months she has had a constant aching in her hips.  She feels like it is in the joint.  She says she will get occasional sharp pains.  Sometimes it is hard to walk.  She has also been awakened from her sleep with sharp pains.  The patient suffers from diabetes which is well controlled.  Her hemoglobin A1c is less than 6.  She is on a statin for hypercholesterolemia.  She says her blood pressure is usually normal at home but elevated at the doctor's office.  She has undergone renal transplant.  She was on dialysis via a left arm access for approximately 9 to 10 years.  She is a non-smoker    PAST MEDICAL HISTORY    Past Medical History:  Diagnosis Date   Arthritis    Diabetes mellitus    HTN (hypertension)    Hyperlipidemia    Pyelonephritis 09/30/11   Renal failure    S/P kidney transplant      FAMILY HISTORY   Family History  Problem Relation Age of Onset   Diabetes Mother    Arthritis Mother    Arthritis Father    Prostate cancer Father    Hypertension Father    Breast cancer Maternal Aunt    Breast cancer Paternal Aunt     SOCIAL HISTORY:   Social History   Socioeconomic History   Marital status: Married    Spouse name: Not on file   Number of children: Not on file   Years of education: Not on file   Highest education level: Not on file  Occupational History   Not on file  Tobacco Use   Smoking status: Never   Smokeless tobacco: Never  Vaping Use   Vaping Use: Never used  Substance  and Sexual Activity   Alcohol use: No   Drug use: No   Sexual activity: Yes    Birth control/protection: I.U.D.  Other Topics Concern   Not on file  Social History Narrative   Not on file   Social Determinants of Health   Financial Resource Strain: Not on file  Food Insecurity: Not on file  Transportation Needs: Not on file  Physical Activity: Not on file  Stress: Not on file  Social Connections: Not on file  Intimate Partner Violence: Not on file    ALLERGIES:    Allergies  Allergen Reactions   Adhesive [Tape] Other (See Comments)    "plastic tape" irritates skin   Ok with paper tape   Wound Dressing Adhesive     Other reaction(s): Other "plastic tape" irritates skin   Ok with paper tape    CURRENT MEDICATIONS:    Current Outpatient Medications  Medication Sig Dispense Refill   amLODipine (NORVASC) 5 MG tablet TAKE 1 TABLET BY MOUTH EVERY DAY 90 tablet 2   atorvastatin (LIPITOR) 10 MG tablet Take 10 mg by mouth daily.      BAQSIMI ONE PACK 3 MG/DOSE POWD PLACE 3 MG INTO THE NOSE ONCE AS NEEDED FOR  UP TO 1 DOSE. 1 each 6   BD PEN NEEDLE NANO 2ND GEN 32G X 4 MM MISC USE 3X A DAY 300 each 3   Cholecalciferol (VITAMIN D3) 125 MCG (5000 UT) CAPS Take by mouth daily.     Continuous Blood Gluc Receiver (DEXCOM G6 RECEIVER) DEVI Use as instructed to check blood sugar 4 times daily 1 each 0   Continuous Blood Gluc Sensor (DEXCOM G6 SENSOR) MISC Use as instructed change every 10 days 9 each 3   Continuous Blood Gluc Transmit (DEXCOM G6 TRANSMITTER) MISC Use as instructed change every 90 days 1 each 3   FIASP 100 UNIT/ML SOLN USE UP TO 60 UNITS A DAY IN THE INSULIN PUMP 60 mL 3   gabapentin (NEURONTIN) 300 MG capsule TAKE 1 CAPSULE BY MOUTH THREE TIMES A DAY 90 capsule 3   Insulin Disposable Pump (OMNIPOD 5 G6 POD, GEN 5,) MISC 1 each by Does not apply route every 3 (three) days. 30 each 3   levonorgestrel (MIRENA) 20 MCG/24HR IUD by Intrauterine route.     LORazepam (ATIVAN)  0.5 MG tablet Take by mouth. prn     metFORMIN (GLUCOPHAGE) 500 MG tablet TAKE 1 TABLET BY MOUTH 2 TIMES DAILY WITH A MEAL. 180 tablet 1   mycophenolate (CELLCEPT) 250 MG capsule Take 500 mg every morning and 750 mg every evening     pantoprazole (PROTONIX) 40 MG tablet Take 40 mg by mouth 2 (two) times daily.      predniSONE (DELTASONE) 5 MG tablet Take 5 mg by mouth daily.     QUEtiapine (SEROQUEL) 50 MG tablet Take 50 mg by mouth at bedtime.     tacrolimus (PROGRAF) 0.5 MG capsule Take 2.5 mg every morning and 3 mg every evening     WEGOVY 2.4 MG/0.75ML SOAJ INJECT 2.4 MG INTO THE SKIN ONCE A WEEK. 3 mL 1   No current facility-administered medications for this visit.    REVIEW OF SYSTEMS:   [X]  denotes positive finding, [ ]  denotes negative finding Cardiac  Comments:  Chest pain or chest pressure:    Shortness of breath upon exertion:    Short of breath when lying flat:    Irregular heart rhythm:        Vascular    Pain in calf, thigh, or hip brought on by ambulation:    Pain in feet at night that wakes you up from your sleep:     Blood clot in your veins:    Leg swelling:         Pulmonary    Oxygen at home:    Productive cough:     Wheezing:         Neurologic    Sudden weakness in arms or legs:     Sudden numbness in arms or legs:     Sudden onset of difficulty speaking or slurred speech:    Temporary loss of vision in one eye:     Problems with dizziness:         Gastrointestinal    Blood in stool:      Vomited blood:         Genitourinary    Burning when urinating:     Blood in urine:        Psychiatric    Major depression:         Hematologic    Bleeding problems:    Problems with blood clotting too easily:  Skin    Rashes or ulcers:        Constitutional    Fever or chills:     PHYSICAL EXAM:   Vitals:   11/23/21 1442  BP: (!) 156/85  Pulse: 85  Resp: 20  Temp: 97.8 F (36.6 C)  SpO2: 97%  Weight: 179 lb (81.2 kg)  Height: 5\' 3"   (1.6 m)    GENERAL: The patient is a well-nourished female, in no acute distress. The vital signs are documented above. CARDIAC: There is a regular rate and rhythm.  VASCULAR: I could not palpate pedal pulses but she had easily palpable femoral pulses.  I also evaluated her femoral arteries with ultrasound.  She has scattered calcific plaque.  No aneurysmal change was noted PULMONARY: Nonlabored respirations ABDOMEN: Soft and non-tender with normal pitched bowel sounds.  MUSCULOSKELETAL: There are no major deformities or cyanosis. NEUROLOGIC: No focal weakness or paresthesias are detected. SKIN: There are no ulcers or rashes noted. PSYCHIATRIC: The patient has a normal affect.  STUDIES:   I have reviewed the following: +-------+-----------+-----------+------------+------------+   ABI/TBI Today's ABI Today's TBI Previous ABI Previous TBI   +-------+-----------+-----------+------------+------------+   Right   [place]          0.55                                    +-------+-----------+-----------+------------+------------+   Left    [place]          0.43                                    +-------+-----------+-----------+------------+------------+ Right toe pressure is 112 Left toe pressure is 87 Waveforms are triphasic  ASSESSMENT and PLAN   Bilateral hip pain: I discussed with the patient that I do not feel that the etiology of her hip pain is arterial insufficiency.  Despite not having palpable pedal pulses, she has triphasic waveforms down to the ankle.  She does have small vessel disease out onto the foot as evidenced by her decreased toe pressures.  I had discussed with the patient that she should continue on her medical regimen to minimize future vascular issues.  I have encouraged her to reach back out to Dr. Gladstone Lighter, who was contemplating MRI of the hip.   Leia Alf, MD, FACS [facility] [phone] Pager [phone]

## 2021-11-24 ENCOUNTER — Encounter: Payer: Self-pay | Admitting: Internal Medicine

## 2021-11-24 ENCOUNTER — Encounter: Payer: 59 | Attending: Internal Medicine | Admitting: Nutrition

## 2021-11-24 ENCOUNTER — Other Ambulatory Visit: Payer: Self-pay

## 2021-11-24 ENCOUNTER — Other Ambulatory Visit: Payer: Self-pay | Admitting: Internal Medicine

## 2021-11-24 DIAGNOSIS — N182 Chronic kidney disease, stage 2 (mild): Secondary | ICD-10-CM | POA: Diagnosis present

## 2021-11-24 DIAGNOSIS — E1122 Type 2 diabetes mellitus with diabetic chronic kidney disease: Secondary | ICD-10-CM | POA: Insufficient documentation

## 2021-11-24 DIAGNOSIS — Z794 Long term (current) use of insulin: Secondary | ICD-10-CM | POA: Insufficient documentation

## 2021-11-25 ENCOUNTER — Encounter: Payer: Self-pay | Admitting: Internal Medicine

## 2021-11-25 ENCOUNTER — Other Ambulatory Visit: Payer: Self-pay | Admitting: Internal Medicine

## 2021-11-25 NOTE — Progress Notes (Signed)
Patient set up  her OmniPOd 3 yesterday, but did not put on a pod. Her settings were not transferred accurately  She filled in I/C ratio as 30, and knew something was wrong, but did not know what this was,  This was changed to 10 per DR, Gherghe's last office note. Other settings:  Basal rate: 0.75, ISF: 30, target 110 with corrections over 110, and timing: 4 hours.  She reports that she set up her [employer] account and [facility] account, and that she liked both her pump and dexcom readings to this practice.  Not able to see this, since she has not started a pod as yet.   Reviewed how the automated mode works and the need for correction boluses to get readings down quicker.  She reported good understanding of this with no further questions.

## 2021-11-28 NOTE — Patient Instructions (Signed)
Please review all videos from [employer] on how to use this pump. Call pump help line if questions or problems with pump usage.

## 2021-12-01 ENCOUNTER — Encounter: Payer: 59 | Admitting: Internal Medicine

## 2021-12-07 ENCOUNTER — Other Ambulatory Visit: Payer: Self-pay | Admitting: Internal Medicine

## 2021-12-18 ENCOUNTER — Emergency Department (HOSPITAL_COMMUNITY)
Admission: EM | Admit: 2021-12-18 | Discharge: 2021-12-18 | Disposition: A | Discharge status: Home / Self Care | Payer: 59 | Attending: Emergency Medicine | Admitting: Emergency Medicine

## 2021-12-18 ENCOUNTER — Emergency Department (HOSPITAL_COMMUNITY): Payer: 59

## 2021-12-18 ENCOUNTER — Other Ambulatory Visit: Payer: Self-pay

## 2021-12-18 DIAGNOSIS — Z20822 Contact with and (suspected) exposure to covid-19: Secondary | ICD-10-CM | POA: Insufficient documentation

## 2021-12-18 DIAGNOSIS — Z7984 Long term (current) use of oral hypoglycemic drugs: Secondary | ICD-10-CM | POA: Diagnosis not present

## 2021-12-18 DIAGNOSIS — I129 Hypertensive chronic kidney disease with stage 1 through stage 4 chronic kidney disease, or unspecified chronic kidney disease: Secondary | ICD-10-CM | POA: Diagnosis not present

## 2021-12-18 DIAGNOSIS — N179 Acute kidney failure, unspecified: Secondary | ICD-10-CM

## 2021-12-18 DIAGNOSIS — Z79899 Other long term (current) drug therapy: Secondary | ICD-10-CM | POA: Insufficient documentation

## 2021-12-18 DIAGNOSIS — Z940 Kidney transplant status: Secondary | ICD-10-CM | POA: Insufficient documentation

## 2021-12-18 DIAGNOSIS — R519 Headache, unspecified: Secondary | ICD-10-CM | POA: Insufficient documentation

## 2021-12-18 DIAGNOSIS — R42 Dizziness and giddiness: Secondary | ICD-10-CM | POA: Diagnosis not present

## 2021-12-18 DIAGNOSIS — R0789 Other chest pain: Secondary | ICD-10-CM | POA: Diagnosis not present

## 2021-12-18 DIAGNOSIS — R112 Nausea with vomiting, unspecified: Secondary | ICD-10-CM | POA: Diagnosis not present

## 2021-12-18 DIAGNOSIS — E1122 Type 2 diabetes mellitus with diabetic chronic kidney disease: Secondary | ICD-10-CM | POA: Diagnosis not present

## 2021-12-18 DIAGNOSIS — N189 Chronic kidney disease, unspecified: Secondary | ICD-10-CM | POA: Diagnosis not present

## 2021-12-18 LAB — CBC
HCT: 36 % (ref 36.0–46.0)
Hemoglobin: 11.6 g/dL — ABNORMAL LOW (ref 12.0–15.0)
MCH: 29.1 pg (ref 26.0–34.0)
MCHC: 32.2 g/dL (ref 30.0–36.0)
MCV: 90.5 fL (ref 80.0–100.0)
Platelets: 218 10*3/uL (ref 150–400)
RBC: 3.98 MIL/uL (ref 3.87–5.11)
RDW: 13.3 % (ref 11.5–15.5)
WBC: 5.4 10*3/uL (ref 4.0–10.5)
nRBC: 0 % (ref 0.0–0.2)

## 2021-12-18 LAB — COMPREHENSIVE METABOLIC PANEL
ALT: 14 U/L (ref 0–44)
AST: 15 U/L (ref 15–41)
Albumin: 4.1 g/dL (ref 3.5–5.0)
Alkaline Phosphatase: 77 U/L (ref 38–126)
Anion gap: 10 (ref 5–15)
BUN: 13 mg/dL (ref 6–20)
CO2: 25 mmol/L (ref 22–32)
Calcium: 9.3 mg/dL (ref 8.9–10.3)
Chloride: 103 mmol/L (ref 98–111)
Creatinine, Ser: 1.68 mg/dL — ABNORMAL HIGH (ref 0.44–1.00)
GFR, Estimated: 38 mL/min — ABNORMAL LOW (ref >60)
Glucose, Bld: 106 mg/dL — ABNORMAL HIGH (ref 70–99)
Potassium: 3.8 mmol/L (ref 3.5–5.1)
Sodium: 138 mmol/L (ref 135–145)
Total Bilirubin: 0.5 mg/dL (ref 0.3–1.2)
Total Protein: 6.8 g/dL (ref 6.5–8.1)

## 2021-12-18 LAB — URINALYSIS, ROUTINE W REFLEX MICROSCOPIC
Bilirubin Urine: NEGATIVE
Glucose, UA: NEGATIVE mg/dL
Hgb urine dipstick: NEGATIVE
Ketones, ur: NEGATIVE mg/dL
Leukocytes,Ua: NEGATIVE
Nitrite: NEGATIVE
Protein, ur: NEGATIVE mg/dL
Specific Gravity, Urine: 1.016 (ref 1.005–1.030)
pH: 5 (ref 5.0–8.0)

## 2021-12-18 LAB — CBG MONITORING, ED: Glucose-Capillary: 86 mg/dL (ref 70–99)

## 2021-12-18 LAB — RESP PANEL BY RT-PCR (FLU A&B, COVID) ARPGX2
Influenza A by PCR: NEGATIVE
Influenza B by PCR: NEGATIVE
SARS Coronavirus 2 by RT PCR: NEGATIVE

## 2021-12-18 LAB — TROPONIN I (HIGH SENSITIVITY)
Troponin I (High Sensitivity): 10 ng/L (ref <18)
Troponin I (High Sensitivity): 11 ng/L (ref <18)

## 2021-12-18 LAB — I-STAT BETA HCG BLOOD, ED (MC, WL, AP ONLY): I-stat hCG, quantitative: 11.7 m[IU]/mL — ABNORMAL HIGH (ref <5)

## 2021-12-18 LAB — PREGNANCY, URINE: Preg Test, Ur: NEGATIVE

## 2021-12-18 LAB — LIPASE, BLOOD: Lipase: 28 U/L (ref 11–51)

## 2021-12-18 MED ORDER — METOCLOPRAMIDE HCL 5 MG/ML IJ SOLN
10.0000 mg | Freq: Once | INTRAMUSCULAR | Status: AC
  Administered 2021-12-18: 10 mg via INTRAVENOUS
  Filled 2021-12-18: qty 2

## 2021-12-18 MED ORDER — LACTATED RINGERS IV BOLUS
1000.0000 mL | Freq: Once | INTRAVENOUS | Status: AC
  Administered 2021-12-18: 1000 mL via INTRAVENOUS

## 2021-12-18 MED ORDER — CLINDAMYCIN HCL 150 MG PO CAPS
300.0000 mg | ORAL_CAPSULE | Freq: Once | ORAL | Status: DC
  Filled 2021-12-18: qty 2

## 2021-12-18 MED ORDER — ONDANSETRON 4 MG PO TBDP
4.0000 mg | ORAL_TABLET | Freq: Once | ORAL | Status: AC | PRN
Start: 2021-12-18 — End: 2021-12-18
  Administered 2021-12-18: 4 mg via ORAL
  Filled 2021-12-18: qty 1

## 2021-12-18 MED ORDER — CLINDAMYCIN HCL 300 MG PO CAPS
300.0000 mg | ORAL_CAPSULE | Freq: Four times a day (QID) | ORAL | 0 refills | Status: DC
Start: 2021-12-18 — End: 2021-12-18

## 2021-12-18 MED ORDER — DIPHENHYDRAMINE HCL 50 MG/ML IJ SOLN
25.0000 mg | Freq: Once | INTRAMUSCULAR | Status: AC
  Administered 2021-12-18: 25 mg via INTRAVENOUS
  Filled 2021-12-18: qty 1

## 2021-12-18 NOTE — ED Triage Notes (Signed)
Pt reported to ED wit c/o emesis, lightheadedness and left sided "chest  tightness" that has been ongoing throughout the day. Denies any shortness of breath, cough, fever, sore throat. Is unsure if she is having sensitivity to light, but feels better with eyes closed.  ?

## 2021-12-18 NOTE — ED Provider Notes (Signed)
?[facility] ?Provider Note ? ? ?CSN: [id number] ?Arrival date & time: 12/18/21  0319 ? ?  ? ?History ? ?Chief Complaint  ?Patient presents with  ? Emesis  ? Dizziness  ? Chest Pain  ? ? ?[name] is a 46 y.o. female with past medical history of insulin-dependent diabetes mellitus, CKD status post renal transplant (2011, followed by [facility], on immunosuppression), hypertension.  Presents to the emergency department with a complaint of headache, lightheadedness, nausea, and chest tightness. ? ?Patient reports that headache has been present since yesterday.  Headache onset was gradual pain progressively worse over time.  Pain is located throughout her entire head however worse at the temples.  Patient endorses associated photophobia.  Patient denies minimal relief with Tylenol.  Patient reports that she had episode of nystagmus after waking this morning which lasted less than 1 minute.  Patient states that after this she began feeling lightheaded.  Lightheadedness is present with change of position. ? ?Patient reports that she developed nausea and vomited yesterday.  Has vomited 6 times last 24 hours.  Describes emesis as stomach contents.  Denies any hematemesis or coffee-ground emesis.  Additionally patient endorses urinary frequency.  Patient denies any associated abdominal pain, blood in his stool, melena, constipation, diarrhea, dysuria, hematuria, urinary urgency, urinary frequency.  Patient states that her baseline creatinine is between 1.2-1.4 ? ?Patient states that she developed chest tightness starting this morning at 7 AM.  Chest pain is located left side of her chest.  At present patient rates the tightness 5/10 on the pain scale.  Pain does not radiate.  Patient denies any associated shortness of breath, palpitations, leg swelling or tenderness, hemoptysis, syncope. ? ?Denies any recent falls or head injury.  Patient denies any known sick contacts.  Has  been vaccinated for COVID-19 received 12 boosters.  Has been vaccinated for influenza.  Patient reports that she has not been sexually active in the last year.  Endorses history of tubal ligation. ? ? ?Emesis ?Associated symptoms: headaches   ?Associated symptoms: no abdominal pain, no chills, no diarrhea and no fever   ?Dizziness ?Associated symptoms: chest pain, headaches, nausea and vomiting   ?Associated symptoms: no blood in stool, no diarrhea, no palpitations, no shortness of breath and no weakness   ?Chest Pain ?Associated symptoms: headache, nausea and vomiting   ?Associated symptoms: no abdominal pain, no back pain, no dizziness, no fever, no numbness, no palpitations, no shortness of breath and no weakness   ? ?  ? ?Home Medications ?Prior to Admission medications   ?Medication Sig Start Date End Date Taking? Authorizing Provider  ?albuterol (VENTOLIN HFA) 108 (90 Base) MCG/ACT inhaler Inhale 1-2 puffs into the lungs every 4 (four) hours as needed for wheezing or shortness of breath. 07/07/21  Yes [provider]  ?amLODipine (NORVASC) 10 MG tablet Take 10 mg by mouth every evening. 10/23/21  Yes [provider]  ?atorvastatin (LIPITOR) 10 MG tablet Take 10 mg by mouth at bedtime. 06/05/16  Yes [provider]  ?azelastine (OPTIVAR) 0.05 % ophthalmic solution Place 2 drops into both eyes 2 (two) times daily as needed (allergies). 12/06/21  Yes [provider]  ?BAQSIMI ONE PACK 3 MG/DOSE POWD PLACE 3 MG INTO THE NOSE ONCE AS NEEDED FOR UP TO 1 DOSE. ?Patient taking differently: Place 3 mg into the nose as needed (blood sugar drops below 60). 10/30/21  Yes Philemon Kingdom, MD  ?BD PEN NEEDLE NANO 2ND GEN  32G X 4 MM MISC USE 3X A DAY 01/17/21  Yes Philemon Kingdom, MD  ?Cholecalciferol (VITAMIN D3) 125 MCG (5000 UT) CAPS Take 5,000 Units by mouth daily.   Yes [provider]  ?Continuous Blood Gluc Receiver (DEXCOM G6 RECEIVER) DEVI Use as instructed to check blood  sugar 4 times daily 05/25/21  Yes Philemon Kingdom, MD  ?Continuous Blood Gluc Sensor (DEXCOM G6 SENSOR) MISC Use as instructed change every 10 days 05/25/21  Yes Philemon Kingdom, MD  ?Continuous Blood Gluc Transmit (DEXCOM G6 TRANSMITTER) MISC Use as instructed change every 90 days 05/25/21  Yes Philemon Kingdom, MD  ?FIASP 100 UNIT/ML SOLN USE UP TO 60 UNITS A DAY IN THE INSULIN PUMP ?Patient taking differently: Use up to 60 units per day in insulin pump 10/16/21  Yes Philemon Kingdom, MD  ?gabapentin (NEURONTIN) 300 MG capsule TAKE 1 CAPSULE BY MOUTH THREE TIMES A DAY ?Patient taking differently: Take 300 mg by mouth 3 (three) times daily. 07/03/21  Yes Hyatt, Max T, DPM  ?Insulin Disposable Pump (OMNIPOD 5 G6 POD, GEN 5,) MISC 1 each by Does not apply route every 3 (three) days. 07/17/21  Yes Philemon Kingdom, MD  ?lamoTRIgine (LAMICTAL) 200 MG tablet Take 200 mg by mouth daily. 11/27/21  Yes [provider]  ?levonorgestrel (MIRENA) 20 MCG/24HR IUD by Intrauterine route.   Yes [provider]  ?LORazepam (ATIVAN) 1 MG tablet Take 1 mg by mouth every 6 (six) hours as needed for anxiety. 11/27/21  Yes [provider]  ?metFORMIN (GLUCOPHAGE) 500 MG tablet TAKE 1 TABLET BY MOUTH 2 TIMES DAILY WITH A MEAL. ?Patient taking differently: Take 500 mg by mouth 2 (two) times daily with a meal. 05/16/21  Yes Philemon Kingdom, MD  ?pantoprazole (PROTONIX) 40 MG tablet Take 40 mg by mouth 2 (two) times daily.  01/31/19  Yes [provider]  ?predniSONE (DELTASONE) 5 MG tablet Take 5 mg by mouth daily.   Yes [provider]  ?QUEtiapine (SEROQUEL) 50 MG tablet Take 50 mg by mouth at bedtime as needed (sleep). 02/13/21  Yes [provider]  ?tacrolimus (PROGRAF) 0.5 MG capsule Take 2.5-3 mg by mouth See admin instructions. 2.5 mg every morning  ?3 mg every evening 01/25/19  Yes [provider]  ?traZODone (DESYREL) 50 MG tablet Take 50-100 mg by mouth at bedtime as  needed for sleep. 11/27/21  Yes [provider]  ?trimethoprim (TRIMPEX) 100 MG tablet Take 100 mg by mouth daily. 12/06/21  Yes [provider]  ?WEGOVY 2.4 MG/0.75ML SOAJ INJECT 2.4 MG INTO THE SKIN ONCE A WEEK. ?Patient taking differently: Inject 2.4 mg into the skin every Saturday. 11/25/21  Yes Glendale Chard, MD  ?amLODipine (NORVASC) 5 MG tablet TAKE 1 TABLET BY MOUTH EVERY DAY ?Patient not taking: Reported on 12/18/2021 04/10/21   Glendale Chard, MD  ?mycophenolate (CELLCEPT) 250 MG capsule Take 500-750 mg by mouth See admin instructions. 500 mg every morning  ?750 mg every evening    [provider]  ?   ? ?Allergies    ?Adhesive [tape] and Wound dressing adhesive   ? ?Review of Systems   ?Review of Systems  ?Constitutional:  Negative for chills and fever.  ?Eyes:  Negative for visual disturbance.  ?Respiratory:  Negative for shortness of breath.   ?Cardiovascular:  Positive for chest pain. Negative for palpitations and leg swelling.  ?Gastrointestinal:  Positive for nausea and vomiting. Negative for abdominal distention, abdominal pain, anal bleeding, blood in stool, constipation,  diarrhea and rectal pain.  ?Genitourinary:  Positive for frequency. Negative for difficulty urinating, dysuria, hematuria, vaginal bleeding, vaginal discharge and vaginal pain.  ?Musculoskeletal:  Negative for back pain and neck pain.  ?Skin:  Negative for color change and rash.  ?Neurological:  Positive for light-headedness and headaches. Negative for dizziness, tremors, seizures, syncope, facial asymmetry, speech difficulty, weakness and numbness.  ?Psychiatric/Behavioral:  Negative for confusion.   ? ?Physical Exam ?Updated Vital Signs ?BP (!) 155/71   Pulse 77   Temp 98.5 ?F (36.9 ?C)   Resp 17   SpO2 96%  ?Physical Exam ?Vitals and nursing note reviewed.  ?Constitutional:   ?   General: She is not in acute distress. ?   Appearance: She is not ill-appearing, toxic-appearing or diaphoretic.  ?HENT:  ?    Head: Normocephalic.  ?Eyes:  ?   General: No scleral icterus.    ?   Right eye: No discharge.     ?   Left eye: No discharge.  ?   Extraocular Movements: Extraocular movements intact.  ?   Conjunctiva/sclera:

## 2021-12-18 NOTE — Discharge Instructions (Addendum)
You came to the emergency department today to be evaluated for your headache, lightheadedness, chest tightness, nausea, and vomiting.  Your lab results show that your creatinine was slightly elevated at 1.68.  Your given fluids.  Please follow-up with your primary care provider or nephrologist in the outpatient setting to have your kidney function rechecked. ? ?Your lab work was reassuring that you are not having acute heart attack today.  You were negative for COVID-19 and influenza. ? ?Please return to the emergency department if you develop any new/concerning symptoms or worsening of current symptoms. ?

## 2021-12-20 ENCOUNTER — Emergency Department (HOSPITAL_COMMUNITY)
Admission: EM | Admit: 2021-12-20 | Discharge: 2021-12-21 | Disposition: A | Discharge status: Home / Self Care | Payer: 59 | Attending: Emergency Medicine | Admitting: Emergency Medicine

## 2021-12-20 ENCOUNTER — Other Ambulatory Visit: Payer: Self-pay

## 2021-12-20 DIAGNOSIS — Z7984 Long term (current) use of oral hypoglycemic drugs: Secondary | ICD-10-CM | POA: Insufficient documentation

## 2021-12-20 DIAGNOSIS — Z794 Long term (current) use of insulin: Secondary | ICD-10-CM | POA: Insufficient documentation

## 2021-12-20 DIAGNOSIS — R1084 Generalized abdominal pain: Secondary | ICD-10-CM | POA: Diagnosis not present

## 2021-12-20 DIAGNOSIS — N189 Chronic kidney disease, unspecified: Secondary | ICD-10-CM | POA: Insufficient documentation

## 2021-12-20 DIAGNOSIS — I129 Hypertensive chronic kidney disease with stage 1 through stage 4 chronic kidney disease, or unspecified chronic kidney disease: Secondary | ICD-10-CM | POA: Diagnosis not present

## 2021-12-20 DIAGNOSIS — Z79899 Other long term (current) drug therapy: Secondary | ICD-10-CM | POA: Insufficient documentation

## 2021-12-20 DIAGNOSIS — R112 Nausea with vomiting, unspecified: Secondary | ICD-10-CM | POA: Insufficient documentation

## 2021-12-20 DIAGNOSIS — E1122 Type 2 diabetes mellitus with diabetic chronic kidney disease: Secondary | ICD-10-CM | POA: Diagnosis not present

## 2021-12-20 NOTE — ED Triage Notes (Signed)
Pt reported to ED with c/o lower abdominal pain, nausea and vomiting, with inability to tolerate oral liquids. Pt states symptoms have been ongoing since Friday. Reports last BM was Thursday and stools were normal in form.  ?

## 2021-12-21 ENCOUNTER — Emergency Department (HOSPITAL_COMMUNITY): Payer: 59

## 2021-12-21 LAB — COMPREHENSIVE METABOLIC PANEL
ALT: 15 U/L (ref 0–44)
AST: 20 U/L (ref 15–41)
Albumin: 4.1 g/dL (ref 3.5–5.0)
Alkaline Phosphatase: 82 U/L (ref 38–126)
Anion gap: 12 (ref 5–15)
BUN: 11 mg/dL (ref 6–20)
CO2: 23 mmol/L (ref 22–32)
Calcium: 9.5 mg/dL (ref 8.9–10.3)
Chloride: 102 mmol/L (ref 98–111)
Creatinine, Ser: 1.56 mg/dL — ABNORMAL HIGH (ref 0.44–1.00)
GFR, Estimated: 41 mL/min — ABNORMAL LOW (ref >60)
Glucose, Bld: 92 mg/dL (ref 70–99)
Potassium: 3.8 mmol/L (ref 3.5–5.1)
Sodium: 137 mmol/L (ref 135–145)
Total Bilirubin: 1 mg/dL (ref 0.3–1.2)
Total Protein: 7 g/dL (ref 6.5–8.1)

## 2021-12-21 LAB — URINALYSIS, ROUTINE W REFLEX MICROSCOPIC
Bilirubin Urine: NEGATIVE
Glucose, UA: NEGATIVE mg/dL
Ketones, ur: 20 mg/dL — AB
Leukocytes,Ua: NEGATIVE
Nitrite: NEGATIVE
Protein, ur: 100 mg/dL — AB
Specific Gravity, Urine: 1.018 (ref 1.005–1.030)
pH: 5 (ref 5.0–8.0)

## 2021-12-21 LAB — CBC WITH DIFFERENTIAL/PLATELET
Abs Immature Granulocytes: 0.01 10*3/uL (ref 0.00–0.07)
Basophils Absolute: 0 10*3/uL (ref 0.0–0.1)
Basophils Relative: 1 %
Eosinophils Absolute: 0 10*3/uL (ref 0.0–0.5)
Eosinophils Relative: 1 %
HCT: 39.3 % (ref 36.0–46.0)
Hemoglobin: 12.5 g/dL (ref 12.0–15.0)
Immature Granulocytes: 0 %
Lymphocytes Relative: 24 %
Lymphs Abs: 1.3 10*3/uL (ref 0.7–4.0)
MCH: 28.7 pg (ref 26.0–34.0)
MCHC: 31.8 g/dL (ref 30.0–36.0)
MCV: 90.3 fL (ref 80.0–100.0)
Monocytes Absolute: 0.6 10*3/uL (ref 0.1–1.0)
Monocytes Relative: 10 %
Neutro Abs: 3.6 10*3/uL (ref 1.7–7.7)
Neutrophils Relative %: 64 %
Platelets: 206 10*3/uL (ref 150–400)
RBC: 4.35 MIL/uL (ref 3.87–5.11)
RDW: 13.2 % (ref 11.5–15.5)
WBC: 5.6 10*3/uL (ref 4.0–10.5)
nRBC: 0 % (ref 0.0–0.2)

## 2021-12-21 LAB — PREGNANCY, URINE: Preg Test, Ur: NEGATIVE

## 2021-12-21 MED ORDER — FAMOTIDINE 20 MG PO TABS
20.0000 mg | ORAL_TABLET | Freq: Two times a day (BID) | ORAL | 0 refills | Status: DC
Start: 2021-12-21 — End: 2022-07-06

## 2021-12-21 MED ORDER — ONDANSETRON 4 MG PO TBDP: 4.0000 mg | ORAL_TABLET | Freq: Three times a day (TID) | ORAL | 0 refills | Status: DC | PRN

## 2021-12-21 MED ORDER — ONDANSETRON HCL 4 MG/2ML IJ SOLN
4.0000 mg | Freq: Once | INTRAMUSCULAR | Status: AC
  Administered 2021-12-21: 4 mg via INTRAVENOUS
  Filled 2021-12-21: qty 2

## 2021-12-21 MED ORDER — SODIUM CHLORIDE 0.9 % IV BOLUS
1000.0000 mL | Freq: Once | INTRAVENOUS | Status: AC
  Administered 2021-12-21: 1000 mL via INTRAVENOUS

## 2021-12-21 NOTE — Discharge Instructions (Signed)
Please read and follow all provided instructions. ? ?Your diagnoses today include:  ?1. Nausea and vomiting, unspecified vomiting type   ? ? ?Tests performed today include: ?Blood cell counts and platelets ?Kidney and liver function tests ?Urine test to look for infection ?A blood or urine test for pregnancy (women only) ?CT scan of the abdomen - was normal but you did have a fair amount of stool especially on the right side ?Vital signs. See below for your results today.  ? ?Medications prescribed:  ?Zofran (ondansetron) - for nausea and vomiting ? ?Pepcid (famotidine) - antihistamine ? ?You can find this medication over-the-counter.  ? ?DO NOT exceed:  ?'20mg'$  Pepcid every 12 hours ? ?Take any prescribed medications only as directed. ? ?Home care instructions:  ?Follow any educational materials contained in this packet. ? ?Follow-up instructions: ?Please follow-up with your primary care provider in the next 2 days for further evaluation of your symptoms.   ? ?Return instructions:  ?SEEK IMMEDIATE MEDICAL ATTENTION IF: ?The pain does not go away or becomes severe  ?A temperature above 101F develops  ?Repeated vomiting occurs (multiple episodes)  ?The pain becomes localized to portions of the abdomen. The right side could possibly be appendicitis. In an adult, the left lower portion of the abdomen could be colitis or diverticulitis.  ?Blood is being passed in stools or vomit (bright red or black tarry stools)  ?You develop chest pain, difficulty breathing, dizziness or fainting, or become confused, poorly responsive, or inconsolable (young children) ?If you have any other emergent concerns regarding your health ? ?Additional Information: ?Abdominal (belly) pain can be caused by many things. Your caregiver performed an examination and possibly ordered blood/urine tests and imaging (CT scan, x-rays, ultrasound). Many cases can be observed and treated at home after initial evaluation in the emergency department. Even  though you are being discharged home, abdominal pain can be unpredictable. Therefore, you need a repeated exam if your pain does not resolve, returns, or worsens. Most patients with abdominal pain don't have to be admitted to the hospital or have surgery, but serious problems like appendicitis and gallbladder attacks can start out as nonspecific pain. Many abdominal conditions cannot be diagnosed in one visit, so follow-up evaluations are very important. ? ?Your vital signs today were: ?BP (!) 146/76   Pulse 89   Temp 99.2 ?F (37.3 ?C) (Oral)   Resp 19   SpO2 96%  ?If your blood pressure (bp) was elevated above 135/85 this visit, please have this repeated by your doctor within one month. ?-------------- ? ?

## 2021-12-21 NOTE — ED Notes (Signed)
Pt offered crackers and ginger ale  ?

## 2021-12-21 NOTE — ED Provider Notes (Signed)
?[facility] ?Provider Note ? ? ?CSN: [id number] ?Arrival date & time: 12/20/21  2326 ? ?  ? ?History ? ?Chief Complaint  ?Patient presents with  ? Abdominal Pain  ? Emesis  ? ? ?[name] is a 46 y.o. female. ? ?Patient with past medical history of insulin-dependent diabetes mellitus, CKD status post renal transplant (2011, followed by [facility], on immunosuppression), diabetes, hypercholesterolemia, hypertension --presents to the emergency department for evaluation of ongoing nausea and vomiting as well as lower abdominal pain.  Patient's renal transplant is in her right lower quadrant.  She has generalized pain across the lower abdomen.  Patient was seen in the emergency department on 12/18/2021.  Symptoms have been persistent since that time.  No imaging performed at that visit.  She reports that her baseline creatinine is 1.2-1.4.  Review of records show that creatinine was 1.68 at that time.  Patient states that she has been able to keep down much fluids.  No chest pain or shortness of breath.  She denies fevers but has felt warm at times.  No shivering chills.  Normal urination without dysuria, hematuria, increased frequency or urgency.  No bloody or black stools. ? ? ?  ? ?Home Medications ?Prior to Admission medications   ?Medication Sig Start Date End Date Taking? Authorizing Provider  ?albuterol (VENTOLIN HFA) 108 (90 Base) MCG/ACT inhaler Inhale 1-2 puffs into the lungs every 4 (four) hours as needed for wheezing or shortness of breath. 07/07/21   [provider]  ?amLODipine (NORVASC) 10 MG tablet Take 10 mg by mouth every evening. 10/23/21   [provider]  ?amLODipine (NORVASC) 5 MG tablet TAKE 1 TABLET BY MOUTH EVERY DAY ?Patient not taking: Reported on 12/18/2021 04/10/21   Glendale Chard, MD  ?atorvastatin (LIPITOR) 10 MG tablet Take 10 mg by mouth at bedtime. 06/05/16   [provider]  ?azelastine (OPTIVAR) 0.05 % ophthalmic  solution Place 2 drops into both eyes 2 (two) times daily as needed (allergies). 12/06/21   [provider]  ?BAQSIMI ONE PACK 3 MG/DOSE POWD PLACE 3 MG INTO THE NOSE ONCE AS NEEDED FOR UP TO 1 DOSE. ?Patient taking differently: Place 3 mg into the nose as needed (blood sugar drops below 60). 10/30/21   Philemon Kingdom, MD  ?BD PEN NEEDLE NANO 2ND GEN 32G X 4 MM MISC USE 3X A DAY 01/17/21   Philemon Kingdom, MD  ?Cholecalciferol (VITAMIN D3) 125 MCG (5000 UT) CAPS Take 5,000 Units by mouth daily.    [provider]  ?Continuous Blood Gluc Receiver (DEXCOM G6 RECEIVER) DEVI Use as instructed to check blood sugar 4 times daily 05/25/21   Philemon Kingdom, MD  ?Continuous Blood Gluc Sensor (DEXCOM G6 SENSOR) MISC Use as instructed change every 10 days 05/25/21   Philemon Kingdom, MD  ?Continuous Blood Gluc Transmit (DEXCOM G6 TRANSMITTER) MISC Use as instructed change every 90 days 05/25/21   Philemon Kingdom, MD  ?FIASP 100 UNIT/ML SOLN USE UP TO 60 UNITS A DAY IN THE INSULIN PUMP ?Patient taking differently: Use up to 60 units per day in insulin pump 10/16/21   Philemon Kingdom, MD  ?gabapentin (NEURONTIN) 300 MG capsule TAKE 1 CAPSULE BY MOUTH THREE TIMES A DAY ?Patient taking differently: Take 300 mg by mouth 3 (three) times daily. 07/03/21   Hyatt, Max T, DPM  ?Insulin Disposable Pump (OMNIPOD 5 G6 POD, GEN 5,) MISC 1 each by Does not apply route every 3 (three) days. 07/17/21  Philemon Kingdom, MD  ?lamoTRIgine (LAMICTAL) 200 MG tablet Take 200 mg by mouth daily. 11/27/21   [provider]  ?levonorgestrel (MIRENA) 20 MCG/24HR IUD by Intrauterine route.    [provider]  ?LORazepam (ATIVAN) 1 MG tablet Take 1 mg by mouth every 6 (six) hours as needed for anxiety. 11/27/21   [provider]  ?metFORMIN (GLUCOPHAGE) 500 MG tablet TAKE 1 TABLET BY MOUTH 2 TIMES DAILY WITH A MEAL. ?Patient taking differently: Take 500 mg by mouth 2 (two) times daily with a meal. 05/16/21    Philemon Kingdom, MD  ?mycophenolate (CELLCEPT) 250 MG capsule Take 500-750 mg by mouth See admin instructions. 500 mg every morning  ?750 mg every evening    [provider]  ?pantoprazole (PROTONIX) 40 MG tablet Take 40 mg by mouth 2 (two) times daily.  01/31/19   [provider]  ?predniSONE (DELTASONE) 5 MG tablet Take 5 mg by mouth daily.    [provider]  ?QUEtiapine (SEROQUEL) 50 MG tablet Take 50 mg by mouth at bedtime as needed (sleep). 02/13/21   [provider]  ?tacrolimus (PROGRAF) 0.5 MG capsule Take 2.5-3 mg by mouth See admin instructions. 2.5 mg every morning  ?3 mg every evening 01/25/19   [provider]  ?traZODone (DESYREL) 50 MG tablet Take 50-100 mg by mouth at bedtime as needed for sleep. 11/27/21   [provider]  ?trimethoprim (TRIMPEX) 100 MG tablet Take 100 mg by mouth daily. 12/06/21   [provider]  ?WEGOVY 2.4 MG/0.75ML SOAJ INJECT 2.4 MG INTO THE SKIN ONCE A WEEK. ?Patient taking differently: Inject 2.4 mg into the skin every Saturday. 11/25/21   Glendale Chard, MD  ?   ? ?Allergies    ?Adhesive [tape] and Wound dressing adhesive   ? ?Review of Systems   ?Review of Systems ? ?Physical Exam ?Updated Vital Signs ?BP (!) 150/83   Pulse 88   Temp 99.2 ?F (37.3 ?C) (Oral)   Resp 20   SpO2 100%  ?Physical Exam ?Vitals and nursing note reviewed.  ?Constitutional:   ?   General: She is not in acute distress. ?   Appearance: She is well-developed.  ?HENT:  ?   Head: Normocephalic and atraumatic.  ?   Right Ear: External ear normal.  ?   Left Ear: External ear normal.  ?   Nose: Nose normal.  ?Eyes:  ?   Conjunctiva/sclera: Conjunctivae normal.  ?Cardiovascular:  ?   Rate and Rhythm: Normal rate and regular rhythm.  ?   Heart sounds: No murmur heard. ?Pulmonary:  ?   Effort: No respiratory distress.  ?   Breath sounds: No wheezing, rhonchi or rales.  ?Abdominal:  ?   Palpations: Abdomen is soft.  ?   Tenderness: There is  abdominal tenderness in the right lower quadrant, suprapubic area and left lower quadrant. There is no guarding or rebound. Negative signs include Murphy's sign and McBurney's sign.  ?   Comments: Abdominal pain is more generalized in nature but she does have some tenderness overlying the transplant  ?Musculoskeletal:  ?   Cervical back: Normal range of motion and neck supple.  ?   Right lower leg: No edema.  ?   Left lower leg: No edema.  ?Skin: ?   General: Skin is warm and dry.  ?   Findings: No rash.  ?Neurological:  ?   General: No focal deficit present.  ?   Mental Status: She is alert.  Mental status is at baseline.  ?   Motor: No weakness.  ?Psychiatric:     ?   Mood and Affect: Mood normal.  ? ? ?ED Results / Procedures / Treatments   ?Labs ?(all labs ordered are listed, but only abnormal results are displayed) ?Labs Reviewed  ?COMPREHENSIVE METABOLIC PANEL - Abnormal; Notable for the following components:  ?    Result Value  ? Creatinine, Ser 1.56 (*)   ? GFR, Estimated 41 (*)   ? All other components within normal limits  ?URINALYSIS, ROUTINE W REFLEX MICROSCOPIC - Abnormal; Notable for the following components:  ? APPearance HAZY (*)   ? Hgb urine dipstick SMALL (*)   ? Ketones, ur 20 (*)   ? Protein, ur 100 (*)   ? Bacteria, UA RARE (*)   ? All other components within normal limits  ?CBC WITH DIFFERENTIAL/PLATELET  ?PREGNANCY, URINE  ? ? ?EKG ?None ? ?Radiology ?CT ABDOMEN PELVIS WO CONTRAST ? ?Result Date: 12/21/2021 ?CLINICAL DATA:  Abdominal pain, nausea/vomiting, renal transplant EXAM: CT ABDOMEN AND PELVIS WITHOUT CONTRAST TECHNIQUE: Multidetector CT imaging of the abdomen and pelvis was performed following the standard protocol without IV contrast. RADIATION DOSE REDUCTION: This exam was performed according to the departmental dose-optimization program which includes automated exposure control, adjustment of the mA and/or kV according to patient size and/or use of iterative reconstruction technique.  COMPARISON:  [facility] CT abdomen/pelvis dated 05/25/2021 FINDINGS: Lower chest: Mild linear scarring/atelectasis lung bases. Hepatobiliary: Unenhanced liver is unremarkable. Gallbladder is unremarkable. No i

## 2021-12-23 ENCOUNTER — Encounter: Payer: Self-pay | Admitting: Internal Medicine

## 2021-12-23 ENCOUNTER — Ambulatory Visit (INDEPENDENT_AMBULATORY_CARE_PROVIDER_SITE_OTHER): Payer: 59 | Admitting: Internal Medicine

## 2021-12-23 ENCOUNTER — Other Ambulatory Visit: Payer: Self-pay

## 2021-12-23 VITALS — BP 120/70 | HR 76 | Temp 97.8°F | Ht 63.0 in | Wt 170.0 lb

## 2021-12-23 DIAGNOSIS — H6121 Impacted cerumen, right ear: Secondary | ICD-10-CM | POA: Diagnosis not present

## 2021-12-23 DIAGNOSIS — R1114 Bilious vomiting: Secondary | ICD-10-CM

## 2021-12-23 DIAGNOSIS — N182 Chronic kidney disease, stage 2 (mild): Secondary | ICD-10-CM | POA: Diagnosis not present

## 2021-12-23 DIAGNOSIS — Z794 Long term (current) use of insulin: Secondary | ICD-10-CM | POA: Diagnosis not present

## 2021-12-23 DIAGNOSIS — E1122 Type 2 diabetes mellitus with diabetic chronic kidney disease: Secondary | ICD-10-CM | POA: Diagnosis not present

## 2021-12-23 DIAGNOSIS — K3184 Gastroparesis: Secondary | ICD-10-CM

## 2021-12-23 DIAGNOSIS — E6609 Other obesity due to excess calories: Secondary | ICD-10-CM

## 2021-12-23 DIAGNOSIS — Z6830 Body mass index (BMI) 30.0-30.9, adult: Secondary | ICD-10-CM

## 2021-12-23 LAB — POCT URINALYSIS DIPSTICK
Blood, UA: NEGATIVE
Glucose, UA: NEGATIVE
Ketones, UA: POSITIVE
Leukocytes, UA: NEGATIVE
Nitrite, UA: NEGATIVE
Protein, UA: POSITIVE — AB
Spec Grav, UA: >=1.03 — AB (ref 1.010–1.025)
Urobilinogen, UA: 0.2 E.U./dL
pH, UA: 5.5 (ref 5.0–8.0)

## 2021-12-23 MED ORDER — ONDANSETRON 4 MG PO TBDP: 4.0000 mg | ORAL_TABLET | Freq: Three times a day (TID) | ORAL | 0 refills | Status: DC | PRN

## 2021-12-23 MED ORDER — MECLIZINE HCL 12.5 MG PO TABS: 12.5000 mg | ORAL_TABLET | Freq: Three times a day (TID) | ORAL | 0 refills | Status: DC | PRN

## 2021-12-23 NOTE — Patient Instructions (Signed)
Gastroparesis  Gastroparesis is a condition in which food takes longer than normal to empty from the stomach. This condition is also known as delayed gastric emptying. It is usually a long-term (chronic) condition. There is no cure, but there are treatments and things that you can do at home to help relieve symptoms. Treating the underlying condition that causesgastroparesis can also help relieve symptoms. What are the causes? In many cases, the cause of this condition is not known. Possible causes include: A hormone (endocrine) disorder, such as hypothyroidism or diabetes. A nervous system disease, such as Parkinson's disease or multiple sclerosis. Cancer, infection, or surgery that affects the stomach or vagus nerve. The vagus nerve runs from your chest, through your neck, and to the lower part of your brain. A connective tissue disorder, such as scleroderma. Certain medicines. What increases the risk? You are more likely to develop this condition if: You have certain disorders or diseases. These may include: An endocrine disorder. An eating disorder. Amyloidosis. Scleroderma. Parkinson's disease. Multiple sclerosis. Cancer or infection of the stomach or the vagus nerve. You have had surgery on your stomach or vagus nerve. You take certain medicines. You are female. What are the signs or symptoms? Symptoms of this condition include: Feeling full after eating very little or a loss of appetite. Nausea, vomiting, or heartburn. Bloating of your abdomen. Inconsistent blood sugar (glucose) levels on blood tests. Unexplained weight loss. Acid from the stomach coming up into the esophagus (gastroesophageal reflux). Sudden tightening (spasm) of the stomach, which can be painful. Symptoms may come and go. Some people may not notice any symptoms. How is this diagnosed? This condition is diagnosed with tests, such as: Tests that check how long it takes food to move through the stomach and  intestines. These tests include: Upper gastrointestinal (GI) series. For this test, you drink a liquid that shows up well on X-rays, and then X-rays are taken of your intestines. Gastric emptying scintigraphy. For this test, you eat food that contains a small amount of radioactive material, and then scans are taken. Wireless capsule GI monitoring system. For this test, you swallow a pill (capsule) that records information about how foods and fluid move through your stomach. Gastric manometry. For this test, a tube is passed down your throat and into your stomach to measure electrical and muscular activity. Endoscopy. For this test, a long, thin tube with a camera and light on the end is passed down your throat and into your stomach to check for problems in your stomach lining. Ultrasound. This test uses sound waves to create images of the inside of your body. This can help rule out gallbladder disease or pancreatitis as a cause of your symptoms. How is this treated? There is no cure for this condition, but treatment and home care may relieve symptoms. Treatment may include: Treating the underlying cause. Managing your symptoms by making changes to your diet and exercise habits. Taking medicines to control nausea and vomiting and to stimulate stomach muscles. Getting food through a feeding tube in the hospital. This may be done in severe cases. Having surgery to insert a device called a gastric electrical stimulator into your body. This device helps improve stomach emptying and control nausea and vomiting. Follow these instructions at home: Take over-the-counter and prescription medicines only as told by your health care provider. Follow instructions from your health care provider about eating or drinking restrictions. Your health care provider may recommend that you: Eat smaller meals more often. Eat low-fat   foods. Eat low-fiber forms of high-fiber foods. For example, eat cooked vegetables instead  of raw vegetables. Have only liquid foods instead of solid foods. Liquid foods are easier to digest. Drink enough fluid to keep your urine pale yellow. Exercise as often as told by your health care provider. Keep all follow-up visits. This is important. Contact a health care provider if you: Notice that your symptoms do not improve with treatment. Have new symptoms. Get help right away if you: Have severe pain in your abdomen that does not improve with treatment. Have nausea that is severe or does not go away. Vomit every time you drink fluids. Summary Gastroparesis is a long-term (chronic) condition in which food takes longer than normal to empty from the stomach. Symptoms include nausea, vomiting, heartburn, bloating of your abdomen, and loss of appetite. Eating smaller portions, low-fat foods, and low-fiber forms of high-fiber foods may help you manage your symptoms. Get help right away if you have severe pain in your abdomen. This information is not intended to replace advice given to you by your health care provider. Make sure you discuss any questions you have with your healthcare provider. Document Revised: 01/28/2020 Document Reviewed: 01/28/2020 Elsevier Patient Education  2022 [employer]  

## 2021-12-23 NOTE — Progress Notes (Signed)
?[name],acting as a Education administrator for [name], MD.,have documented all relevant documentation on the behalf of [name], MD,as directed by  [name], MD while in the presence of [name], MD.  ?This visit occurred during the SARS-CoV-2 public health emergency.  Safety protocols were in place, including screening questions prior to the visit, additional usage of staff PPE, and extensive cleaning of exam room while observing appropriate contact time as indicated for disinfecting solutions. ? ?Subjective:  ?  ? Patient ID: [name] , female    DOB: Jun 11, 1976 , 46 y.o.   MRN: [id number] ? ? ?Chief Complaint  ?Patient presents with  ? ER F/U  ? ? ?HPI ? ?Patient presents today for a ER f/u. She initially presented to [facility] ER on 3/17 c/o chest tightness and headache. She was felt to be low risk for ACS, headache improved with migraine cocktail.  Her sx recurred on Saturday the 18th. She states she felt off balance. She states she had to drive her Mom to a funeral in [place] and then back to [place]. Admits she didn't eat much all day. She is not sure how she really got home.  ? ?She went back to ER on 3/19 for further evaluation of  nausea and vomiting. She reports they gave her some zofran, reglan and benadryl. CT scan performed, results significant for RLQ renal transplant without hydronephrosis, no evidence of bowel obstruction, normal appendix and mild sigmoid diverticulosis, without evidence of diverticulitis.  She was also given iv fluids for dehydration. She has not had any more episodes of vomiting. She is still feeling quite fatigued.  ? ? ?  ?  ? ?Past Medical History:  ?Diagnosis Date  ? Arthritis   ? Diabetes mellitus   ? HTN (hypertension)   ? Hyperlipidemia   ? Pyelonephritis 09/30/11  ? Renal failure   ? S/P kidney transplant   ?  ? ?Family History  ?Problem Relation Age of Onset  ? Diabetes Mother   ? Arthritis Mother   ? Arthritis Father   ? Prostate cancer  Father   ? Hypertension Father   ? Breast cancer Maternal Aunt   ? Breast cancer Paternal Aunt   ? ? ? ?Current Outpatient Medications:  ?  albuterol (VENTOLIN HFA) 108 (90 Base) MCG/ACT inhaler, Inhale 1-2 puffs into the lungs every 4 (four) hours as needed for wheezing or shortness of breath., Disp: , Rfl:  ?  amLODipine (NORVASC) 10 MG tablet, Take 10 mg by mouth every evening., Disp: , Rfl:  ?  atorvastatin (LIPITOR) 10 MG tablet, Take 10 mg by mouth at bedtime., Disp: , Rfl:  ?  azelastine (OPTIVAR) 0.05 % ophthalmic solution, Place 2 drops into both eyes 2 (two) times daily as needed (allergies)., Disp: , Rfl:  ?  BAQSIMI ONE PACK 3 MG/DOSE POWD, PLACE 3 MG INTO THE NOSE ONCE AS NEEDED FOR UP TO 1 DOSE. (Patient taking differently: Place 3 mg into the nose as needed (blood sugar drops below 60).), Disp: 1 each, Rfl: 6 ?  BD PEN NEEDLE NANO 2ND GEN 32G X 4 MM MISC, USE 3X A DAY, Disp: 300 each, Rfl: 3 ?  Cholecalciferol (VITAMIN D3) 125 MCG (5000 UT) CAPS, Take 5,000 Units by mouth daily., Disp: , Rfl:  ?  Continuous Blood Gluc Receiver ([place]) DEVI, Use as instructed to check blood sugar 4 times daily, Disp: 1 each, Rfl: 0 ?  Continuous Blood Gluc  Sensor (DEXCOM G6 SENSOR) MISC, Use as instructed change every 10 days, Disp: 9 each, Rfl: 3 ?  Continuous Blood Gluc Transmit (DEXCOM G6 TRANSMITTER) MISC, Use as instructed change every 90 days, Disp: 1 each, Rfl: 3 ?  diphenhydrAMINE (BENADRYL) 25 MG tablet, Take 50 mg by mouth every 6 (six) hours as needed for itching or allergies., Disp: , Rfl:  ?  famotidine (PEPCID) 20 MG tablet, Take 1 tablet (20 mg total) by mouth 2 (two) times daily., Disp: 30 tablet, Rfl: 0 ?  FIASP 100 UNIT/ML SOLN, USE UP TO 60 UNITS A DAY IN THE INSULIN PUMP (Patient taking differently: Use up to 60 units per day in insulin pump), Disp: 60 mL, Rfl: 3 ?  gabapentin (NEURONTIN) 300 MG capsule, TAKE 1 CAPSULE BY MOUTH THREE TIMES A DAY (Patient taking differently: Take 300 mg  by mouth 3 (three) times daily.), Disp: 90 capsule, Rfl: 3 ?  Insulin Disposable Pump (OMNIPOD 5 G6 POD, GEN 5,) MISC, 1 each by Does not apply route every 3 (three) days., Disp: 30 each, Rfl: 3 ?  levonorgestrel (MIRENA) 20 MCG/24HR IUD, by Intrauterine route., Disp: , Rfl:  ?  LORazepam (ATIVAN) 1 MG tablet, Take 1 mg by mouth every 6 (six) hours as needed for anxiety., Disp: , Rfl:  ?  meclizine (ANTIVERT) 12.5 MG tablet, Take 1 tablet (12.5 mg total) by mouth 3 (three) times daily as needed for dizziness., Disp: 30 tablet, Rfl: 0 ?  metFORMIN (GLUCOPHAGE) 500 MG tablet, TAKE 1 TABLET BY MOUTH 2 TIMES DAILY WITH A MEAL. (Patient taking differently: Take 500 mg by mouth 2 (two) times daily with a meal.), Disp: 180 tablet, Rfl: 1 ?  mycophenolate (CELLCEPT) 250 MG capsule, Take 500-750 mg by mouth See admin instructions. 500 mg every morning  750 mg every evening, Disp: , Rfl:  ?  pantoprazole (PROTONIX) 40 MG tablet, Take 40 mg by mouth 2 (two) times daily. , Disp: , Rfl:  ?  predniSONE (DELTASONE) 5 MG tablet, Take 5 mg by mouth daily., Disp: , Rfl:  ?  QUEtiapine (SEROQUEL) 50 MG tablet, Take 50 mg by mouth at bedtime as needed (sleep)., Disp: , Rfl:  ?  tacrolimus (PROGRAF) 0.5 MG capsule, Take 2.5-3 mg by mouth See admin instructions. 2.5 mg every morning  3 mg every evening, Disp: , Rfl:  ?  traZODone (DESYREL) 50 MG tablet, Take 50-100 mg by mouth at bedtime as needed for sleep., Disp: , Rfl:  ?  trimethoprim (TRIMPEX) 100 MG tablet, Take 100 mg by mouth daily., Disp: , Rfl:  ?  WEGOVY 2.4 MG/0.75ML SOAJ, INJECT 2.4 MG INTO THE SKIN ONCE A WEEK. (Patient taking differently: Inject 2.4 mg into the skin every Saturday.), Disp: 9 mL, Rfl: 1 ?  ondansetron (ZOFRAN-ODT) 4 MG disintegrating tablet, Take 1 tablet (4 mg total) by mouth every 8 (eight) hours as needed for nausea or vomiting., Disp: 20 tablet, Rfl: 0  ? ?Allergies  ?Allergen Reactions  ? Adhesive [Tape] Other (See Comments)  ?  "plastic tape"  irritates skin   Ok with paper tape  ? Strawberry (Diagnostic)   ? Wound Dressing Adhesive   ?  Other reaction(s): Other ?"plastic tape" irritates skin   Ok with paper tape  ?  ? ?Review of Systems  ?Constitutional: Negative.   ?Respiratory: Negative.    ?Cardiovascular: Negative.   ?Gastrointestinal: Negative.   ?Neurological: Negative.   ?Psychiatric/Behavioral: Negative.     ? ?Today's Vitals  ?  12/23/21 3532 12/23/21 9924 12/23/21 0950 12/23/21 0952  ?BP: 122/70 124/70 124/76 120/70  ?Pulse: 76 76 72 76  ?Temp: 97.8 ?F (36.6 ?C)     ?Weight: 170 lb (77.1 kg)     ?Height: '5\' 3"'$  (1.6 m)     ?PainSc: 0-No pain     ? ?Body mass index is 30.11 kg/m?.  ?Wt Readings from Last 3 Encounters:  ?12/31/21 167 lb 9.6 oz (76 kg)  ?12/23/21 170 lb (77.1 kg)  ?11/23/21 179 lb (81.2 kg)  ?  ?Objective:  ?Physical Exam ?Vitals and nursing note reviewed.  ?Constitutional:   ?   Appearance: Normal appearance. She is ill-appearing.  ?HENT:  ?   Head: Normocephalic and atraumatic.  ?   Right Ear: Ear canal and external ear normal. There is impacted cerumen.  ?   Left Ear: Tympanic membrane, ear canal and external ear normal. There is no impacted cerumen.  ?   Nose:  ?   Comments: Masked  ?   Mouth/Throat:  ?   Comments: Masked  ?Eyes:  ?   Extraocular Movements: Extraocular movements intact.  ?Cardiovascular:  ?   Rate and Rhythm: Normal rate and regular rhythm.  ?   Heart sounds: Normal heart sounds.  ?Pulmonary:  ?   Effort: Pulmonary effort is normal.  ?   Breath sounds: Normal breath sounds.  ?Musculoskeletal:  ?   Cervical back: Normal range of motion.  ?Skin: ?   General: Skin is warm.  ?Neurological:  ?   General: No focal deficit present.  ?   Mental Status: She is alert.  ?Psychiatric:     ?   Mood and Affect: Mood normal.     ?   Behavior: Behavior normal.  ?   ?Assessment And Plan:  ?   ?1. Bilious vomiting with nausea ?Comments: ER records were reviewed in full detail. Her sx are resolving. She is encouraged to slowly  incorporate more solid foods into her diet. Encouraged to stay well hydrated.  ? ?2. Gastroparesis ?Comments: She reports h/o gastroparesis - diagnosed may years ago. Sx are likely aggravated by [facility]. May need t

## 2021-12-31 ENCOUNTER — Encounter: Payer: Self-pay | Admitting: Internal Medicine

## 2021-12-31 ENCOUNTER — Ambulatory Visit (INDEPENDENT_AMBULATORY_CARE_PROVIDER_SITE_OTHER): Payer: 59 | Admitting: Internal Medicine

## 2021-12-31 VITALS — BP 120/64 | HR 64 | Temp 98.6°F | Ht 63.0 in | Wt 167.6 lb

## 2021-12-31 DIAGNOSIS — I129 Hypertensive chronic kidney disease with stage 1 through stage 4 chronic kidney disease, or unspecified chronic kidney disease: Secondary | ICD-10-CM

## 2021-12-31 DIAGNOSIS — N182 Chronic kidney disease, stage 2 (mild): Secondary | ICD-10-CM

## 2021-12-31 DIAGNOSIS — Z794 Long term (current) use of insulin: Secondary | ICD-10-CM

## 2021-12-31 DIAGNOSIS — Z940 Kidney transplant status: Secondary | ICD-10-CM

## 2021-12-31 DIAGNOSIS — Z6829 Body mass index (BMI) 29.0-29.9, adult: Secondary | ICD-10-CM

## 2021-12-31 DIAGNOSIS — Z0000 Encounter for general adult medical examination without abnormal findings: Secondary | ICD-10-CM

## 2021-12-31 DIAGNOSIS — E1122 Type 2 diabetes mellitus with diabetic chronic kidney disease: Secondary | ICD-10-CM | POA: Diagnosis not present

## 2021-12-31 DIAGNOSIS — D849 Immunodeficiency, unspecified: Secondary | ICD-10-CM

## 2021-12-31 NOTE — Patient Instructions (Signed)

## 2021-12-31 NOTE — Progress Notes (Signed)
?[name],acting as a Education administrator for Joyce Greenland, MD.,have documented all relevant documentation on the behalf of Joyce Greenland, MD,as directed by  Joyce Greenland, MD while in the presence of Joyce Greenland, MD.  ?This visit occurred during the SARS-CoV-2 public health emergency.  Safety protocols were in place, including screening questions prior to the visit, additional usage of staff PPE, and extensive cleaning of exam room while observing appropriate contact time as indicated for disinfecting solutions. ? ?Subjective:  ?  ? Patient ID: [name] , female    DOB: [birth year] , 46 y.o.   MRN: [id number] ? ? ?Chief Complaint  ?Patient presents with  ? Annual Exam  ? ? ?HPI ? ?Patient presents today for a physical. She reports compliance with her medications. She is seen by Dr.Ross for her GYN care. She reports she has an upcoming appointment for a pap smear. She does not have any questions or concerns.  ? ?Diabetes ?She presents for her follow-up diabetic visit. She has type 2 diabetes mellitus. Her disease course has been stable. There are no hypoglycemic associated symptoms. Pertinent negatives for diabetes include no blurred vision and no chest pain. There are no hypoglycemic complications. Diabetic complications include nephropathy. Risk factors for coronary artery disease include diabetes mellitus, hypertension, dyslipidemia, sedentary lifestyle and obesity.  ?Hypertension ?This is a chronic problem. The current episode started more than 1 year ago. The problem has been gradually improving since onset. The problem is controlled. Pertinent negatives include no blurred vision, chest pain, palpitations or shortness of breath.   ? ?Past Medical History:  ?Diagnosis Date  ? Arthritis   ? Diabetes mellitus   ? HTN (hypertension)   ? Hyperlipidemia   ? Pyelonephritis 09/30/11  ? Renal failure   ? S/P kidney transplant   ?  ? ?Family History  ?Problem Relation Age of Onset  ? Diabetes Mother   ?  Arthritis Mother   ? Arthritis Father   ? Prostate cancer Father   ? Hypertension Father   ? Breast cancer Maternal Aunt   ? Breast cancer Paternal Aunt   ? ? ? ?Current Outpatient Medications:  ?  albuterol (VENTOLIN HFA) 108 (90 Base) MCG/ACT inhaler, Inhale 1-2 puffs into the lungs every 4 (four) hours as needed for wheezing or shortness of breath., Disp: , Rfl:  ?  amLODipine (NORVASC) 10 MG tablet, Take 10 mg by mouth every evening., Disp: , Rfl:  ?  atorvastatin (LIPITOR) 10 MG tablet, Take 10 mg by mouth at bedtime., Disp: , Rfl:  ?  azelastine (OPTIVAR) 0.05 % ophthalmic solution, Place 2 drops into both eyes 2 (two) times daily as needed (allergies)., Disp: , Rfl:  ?  BAQSIMI ONE PACK 3 MG/DOSE POWD, PLACE 3 MG INTO THE NOSE ONCE AS NEEDED FOR UP TO 1 DOSE. (Patient taking differently: Place 3 mg into the nose as needed (blood sugar drops below 60).), Disp: 1 each, Rfl: 6 ?  BD PEN NEEDLE NANO 2ND GEN 32G X 4 MM MISC, USE 3X A DAY, Disp: 300 each, Rfl: 3 ?  Cholecalciferol (VITAMIN D3) 125 MCG (5000 UT) CAPS, Take 5,000 Units by mouth daily., Disp: , Rfl:  ?  Continuous Blood Gluc Receiver ([place]) DEVI, Use as instructed to check blood sugar 4 times daily, Disp: 1 each, Rfl: 0 ?  Continuous Blood Gluc Sensor (DEXCOM G6 SENSOR) MISC, Use as instructed change every 10 days, Disp: 9 each, Rfl: 3 ?  Continuous Blood Gluc Transmit (DEXCOM G6 TRANSMITTER) MISC, Use as instructed change every 90 days, Disp: 1 each, Rfl: 3 ?  diphenhydrAMINE (BENADRYL) 25 MG tablet, Take 50 mg by mouth every 6 (six) hours as needed for itching or allergies., Disp: , Rfl:  ?  famotidine (PEPCID) 20 MG tablet, Take 1 tablet (20 mg total) by mouth 2 (two) times daily., Disp: 30 tablet, Rfl: 0 ?  FIASP 100 UNIT/ML SOLN, USE UP TO 60 UNITS A DAY IN THE INSULIN PUMP (Patient taking differently: Use up to 60 units per day in insulin pump), Disp: 60 mL, Rfl: 3 ?  gabapentin (NEURONTIN) 300 MG capsule, TAKE 1 CAPSULE BY MOUTH  THREE TIMES A DAY (Patient taking differently: Take 300 mg by mouth 3 (three) times daily.), Disp: 90 capsule, Rfl: 3 ?  Insulin Disposable Pump (OMNIPOD 5 G6 POD, GEN 5,) MISC, 1 each by Does not apply route every 3 (three) days., Disp: 30 each, Rfl: 3 ?  levonorgestrel (MIRENA) 20 MCG/24HR IUD, by Intrauterine route., Disp: , Rfl:  ?  LORazepam (ATIVAN) 1 MG tablet, Take 1 mg by mouth every 6 (six) hours as needed for anxiety., Disp: , Rfl:  ?  meclizine (ANTIVERT) 12.5 MG tablet, Take 1 tablet (12.5 mg total) by mouth 3 (three) times daily as needed for dizziness., Disp: 30 tablet, Rfl: 0 ?  metFORMIN (GLUCOPHAGE) 500 MG tablet, TAKE 1 TABLET BY MOUTH 2 TIMES DAILY WITH A MEAL. (Patient taking differently: Take 500 mg by mouth 2 (two) times daily with a meal.), Disp: 180 tablet, Rfl: 1 ?  mycophenolate (CELLCEPT) 250 MG capsule, Take 500-750 mg by mouth See admin instructions. 500 mg every morning  750 mg every evening, Disp: , Rfl:  ?  ondansetron (ZOFRAN-ODT) 4 MG disintegrating tablet, Take 1 tablet (4 mg total) by mouth every 8 (eight) hours as needed for nausea or vomiting., Disp: 20 tablet, Rfl: 0 ?  pantoprazole (PROTONIX) 40 MG tablet, Take 40 mg by mouth 2 (two) times daily. , Disp: , Rfl:  ?  predniSONE (DELTASONE) 5 MG tablet, Take 5 mg by mouth daily., Disp: , Rfl:  ?  QUEtiapine (SEROQUEL) 50 MG tablet, Take 50 mg by mouth at bedtime as needed (sleep)., Disp: , Rfl:  ?  tacrolimus (PROGRAF) 0.5 MG capsule, Take 2.5-3 mg by mouth See admin instructions. 2.5 mg every morning  3 mg every evening, Disp: , Rfl:  ?  traZODone (DESYREL) 50 MG tablet, Take 50-100 mg by mouth at bedtime as needed for sleep., Disp: , Rfl:  ?  trimethoprim (TRIMPEX) 100 MG tablet, Take 100 mg by mouth daily., Disp: , Rfl:  ?  WEGOVY 2.4 MG/0.75ML SOAJ, INJECT 2.4 MG INTO THE SKIN ONCE A WEEK. (Patient taking differently: Inject 2.4 mg into the skin every Saturday.), Disp: 9 mL, Rfl: 1  ? ?Allergies  ?Allergen Reactions  ?  Adhesive [Tape] Other (See Comments)  ?  "plastic tape" irritates skin   Ok with paper tape  ? Strawberry (Diagnostic)   ? Wound Dressing Adhesive   ?  Other reaction(s): Other ?"plastic tape" irritates skin   Ok with paper tape  ?  ?The patient states she uses IUD for birth control. Last LMP was No LMP recorded. (Menstrual status: IUD).. Negative for Dysmenorrhea. Negative for: breast discharge, breast lump(s), breast pain and breast self exam. Associated symptoms include abnormal vaginal bleeding. Pertinent negatives include abnormal bleeding (hematology), anxiety, decreased libido, depression, difficulty falling sleep, dyspareunia, history of infertility,  nocturia, sexual dysfunction, sleep disturbances, urinary incontinence, urinary urgency, vaginal discharge and vaginal itching. Diet regular.   ? . The patient's tobacco use is:  ?Social History  ? ?Tobacco Use  ?Smoking Status Never  ?Smokeless Tobacco Never  ?Marland Kitchen She has been exposed to passive smoke. The patient's alcohol use is:  ?Social History  ? ?Substance and Sexual Activity  ?Alcohol Use No  ? ?Review of Systems  ?Constitutional: Negative.   ?HENT: Negative.    ?Eyes: Negative.  Negative for blurred vision.  ?Respiratory: Negative.  Negative for shortness of breath.   ?Cardiovascular: Negative.  Negative for chest pain and palpitations.  ?Gastrointestinal: Negative.   ?Endocrine: Negative.   ?Genitourinary: Negative.   ?Musculoskeletal: Negative.   ?Skin: Negative.   ?Allergic/Immunologic: Negative.   ?Neurological: Negative.   ?Hematological: Negative.   ?Psychiatric/Behavioral: Negative.     ? ?Today's Vitals  ? 12/31/21 1126  ?BP: 120/64  ?Pulse: 64  ?Temp: 98.6 ?F (37 ?C)  ?Weight: 167 lb 9.6 oz (76 kg)  ?Height: '5\' 3"'$  (1.6 m)  ?PainSc: 0-No pain  ? ?Body mass index is 29.69 kg/m?.  ?Wt Readings from Last 3 Encounters:  ?12/31/21 167 lb 9.6 oz (76 kg)  ?12/23/21 170 lb (77.1 kg)  ?11/23/21 179 lb (81.2 kg)  ?  ? ?Objective:  ?Physical Exam ?Vitals and  nursing note reviewed.  ?Constitutional:   ?   Appearance: Normal appearance.  ?HENT:  ?   Head: Normocephalic and atraumatic.  ?   Right Ear: Tympanic membrane, ear canal and external ear normal.  ?   Left

## 2022-01-01 ENCOUNTER — Encounter: Payer: Self-pay | Admitting: Internal Medicine

## 2022-01-01 ENCOUNTER — Other Ambulatory Visit: Payer: Self-pay | Admitting: Internal Medicine

## 2022-01-01 LAB — LIPASE: Lipase: 35 U/L (ref 14–72)

## 2022-01-01 LAB — LIPID PANEL W/O CHOL/HDL RATIO
Cholesterol, Total: 115 mg/dL (ref 100–199)
HDL: 45 mg/dL (ref >39)
LDL Chol Calc (NIH): 53 mg/dL (ref 0–99)
Triglycerides: 88 mg/dL (ref 0–149)
VLDL Cholesterol Cal: 17 mg/dL (ref 5–40)

## 2022-01-06 ENCOUNTER — Encounter: Payer: Self-pay | Admitting: Neurology

## 2022-01-06 ENCOUNTER — Ambulatory Visit (INDEPENDENT_AMBULATORY_CARE_PROVIDER_SITE_OTHER): Payer: 59 | Admitting: Neurology

## 2022-01-06 VITALS — BP 126/75 | HR 86 | Ht 63.0 in | Wt 162.5 lb

## 2022-01-06 DIAGNOSIS — R531 Weakness: Secondary | ICD-10-CM | POA: Insufficient documentation

## 2022-01-06 DIAGNOSIS — M542 Cervicalgia: Secondary | ICD-10-CM | POA: Insufficient documentation

## 2022-01-06 DIAGNOSIS — R200 Anesthesia of skin: Secondary | ICD-10-CM | POA: Diagnosis not present

## 2022-01-06 DIAGNOSIS — R251 Tremor, unspecified: Secondary | ICD-10-CM | POA: Insufficient documentation

## 2022-01-06 MED ORDER — PROPRANOLOL HCL 20 MG PO TABS: 10.0000 mg | ORAL_TABLET | Freq: Three times a day (TID) | ORAL | 11 refills | Status: DC | PRN

## 2022-01-06 NOTE — Progress Notes (Signed)
? ?Chief Complaint  ?Patient presents with  ? New Patient (Initial Visit)  ?  Rm 15. Alone. ?NP proficient paper referral for Resting tremor.  ? ? ? ? ?ASSESSMENT AND PLAN ? ?ELDA DUNKERSON is a 46 y.o. female   ?Left arm tremor ?Worsening left neck pain, radiating pain to left shoulder, history of left AV fistula, ? On examination, she has bilateral hand intrinsic muscle atrophy, right worse than left, but worsening left fourth and fifth finger sensory loss. There is no parkinsonian features on examination, action tremor, left worse than right, ? Likely essential tremor but affecting left arm worst, in the setting of previous left arm AV fistula, ? Also need to rule out left cervical radiculopathy, MRI of cervical spine ? Laboratory evaluation TSH ? Inderal 10 mg 3 times daily as needed ? ? ?DIAGNOSTIC DATA (LABS, IMAGING, TESTING) ?- I reviewed patient records, labs, notes, testing and imaging myself where available. ? ? ?MEDICAL HISTORY: ? ?DESHAY KIRSTEIN, is a 46 year old female seen in request by Dr. Iran Planas, for evaluation of left hand tremor, initial evaluation was on January 06, 2022 ? ?I reviewed and summarized the referring note.PMHx ?HTN ?HLD ?DM  ?S/p kidney transplant in 2011, peritoneal dialysis for 2000-2008, HD 2008-2011 ?Anxiety, chronic insomnia ? ?Patient had a long history of diabetes, previously was not under control, leading to kidney failure, was on peritoneal dialysis from 2000-2008, then hemodialysis from 2008-2011, eventually had kidney transplant in 2011, since then, she has been doing very well, she had left arm AV fistula, ? ?She also had history of bilateral ulnar decompression surgery in the past, with residual right hand weakness, curling of fourth and fifth finger, but improved compared to presurgery, she also has a left hand fourth and fifth finger numbness, despite previous decompression surgery of left ulnar across the elbow, she actually complaining of worsening left hand  paresthesia on the ulnar side over the past few months ? ?She is right-handed, noticed left hand tremor while typing, it does interrupt her job performance, she has chronic neck pain, worsening neck pain recently, radiating pain to left shoulder ? ?She denies gait abnormality, denies bilateral foot and lower extremity paresthesia ?  ? ?PHYSICAL EXAM: ?  ?Vitals:  ? 01/06/22 0733  ?BP: 126/75  ?Pulse: 86  ?Weight: 162 lb 8 oz (73.7 kg)  ?Height: '5\' 3"'$  (1.6 m)  ? ?Not recorded ?  ? ? ?Body mass index is 28.79 kg/m?. ? ?PHYSICAL EXAMNIATION: ? ?Gen: NAD, conversant, well nourised, well groomed                     ?Cardiovascular: Regular rate rhythm, no peripheral edema, warm, nontender. ?Eyes: Conjunctivae clear without exudates or hemorrhage ?Neck: Supple, no carotid bruits. ?Pulmonary: Clear to auscultation bilaterally  ? ?NEUROLOGICAL EXAM: ? ?MENTAL STATUS: ?Speech: ?   Speech is normal; fluent and spontaneous with normal comprehension.  ?Cognition: ?    Orientation to time, place and person ?    Normal recent and remote memory ?    Normal Attention span and concentration ?    Normal Language, naming, repeating,spontaneous speech ?    Fund of knowledge ?  ?CRANIAL NERVES: ?CN II: Visual fields are full to confrontation. Pupils are round equal and briskly reactive to light. ?CN III, IV, VI: extraocular movement are normal. No ptosis. ?CN V: Facial sensation is intact to light touch ?CN VII: Face is symmetric with normal eye closure  ?CN VIII: Hearing is  normal to causal conversation. ?CN IX, X: Phonation is normal. ?CN XI: Head turning and shoulder shrug are intact ? ?MOTOR: Bilateral hand intrinsic muscle atrophy, right worse than left, finger abduction weakness, right side is moderate, left side is mild, mild left hand pronation supination tremor when mimic typing position ? ?REFLEXES: ?Reflexes are 2 and symmetric at the biceps, triceps, knees, and ankles. Plantar responses are flexor. ? ?SENSORY: Decreased  light touch, pinprick at left fourth and fifth finger, ? ?COORDINATION: ?There is no trunk or limb dysmetria noted. ? ?GAIT/STANCE: ?Posture is normal. Gait is steady with normal steps, base, arm swing, and turning. Heel and toe walking are normal. Tandem gait is normal.  ?Romberg is absent. ? ?REVIEW OF SYSTEMS:  ?Full 14 system review of systems performed and notable only for as above ?All other review of systems were negative. ? ? ?ALLERGIES: ?Allergies  ?Allergen Reactions  ? Adhesive [Tape] Other (See Comments)  ?  "plastic tape" irritates skin   Ok with paper tape  ? Strawberry (Diagnostic)   ? Wound Dressing Adhesive   ?  Other reaction(s): Other ?"plastic tape" irritates skin   Ok with paper tape  ? ? ?HOME MEDICATIONS: ?Current Outpatient Medications  ?Medication Sig Dispense Refill  ? albuterol (VENTOLIN HFA) 108 (90 Base) MCG/ACT inhaler Inhale 1-2 puffs into the lungs every 4 (four) hours as needed for wheezing or shortness of breath.    ? amLODipine (NORVASC) 10 MG tablet Take 10 mg by mouth every evening.    ? atorvastatin (LIPITOR) 10 MG tablet Take 10 mg by mouth at bedtime.    ? azelastine (OPTIVAR) 0.05 % ophthalmic solution Place 2 drops into both eyes 2 (two) times daily as needed (allergies).    ? BAQSIMI ONE PACK 3 MG/DOSE POWD PLACE 3 MG INTO THE NOSE ONCE AS NEEDED FOR UP TO 1 DOSE. (Patient taking differently: Place 3 mg into the nose as needed (blood sugar drops below 60).) 1 each 6  ? BD PEN NEEDLE NANO 2ND GEN 32G X 4 MM MISC USE 3X A DAY 300 each 3  ? Cholecalciferol (VITAMIN D3) 125 MCG (5000 UT) CAPS Take 5,000 Units by mouth daily.    ? Continuous Blood Gluc Receiver (DEXCOM G6 RECEIVER) DEVI Use as instructed to check blood sugar 4 times daily 1 each 0  ? Continuous Blood Gluc Sensor (DEXCOM G6 SENSOR) MISC Use as instructed change every 10 days 9 each 3  ? Continuous Blood Gluc Transmit (DEXCOM G6 TRANSMITTER) MISC Use as instructed change every 90 days 1 each 3  ? diphenhydrAMINE  (BENADRYL) 25 MG tablet Take 50 mg by mouth every 6 (six) hours as needed for itching or allergies.    ? famotidine (PEPCID) 20 MG tablet Take 1 tablet (20 mg total) by mouth 2 (two) times daily. 30 tablet 0  ? FIASP 100 UNIT/ML SOLN USE UP TO 60 UNITS A DAY IN THE INSULIN PUMP (Patient taking differently: Use up to 60 units per day in insulin pump) 60 mL 3  ? gabapentin (NEURONTIN) 300 MG capsule TAKE 1 CAPSULE BY MOUTH THREE TIMES A DAY (Patient taking differently: Take 300 mg by mouth 3 (three) times daily.) 90 capsule 3  ? Insulin Disposable Pump (OMNIPOD 5 G6 POD, GEN 5,) MISC 1 each by Does not apply route every 3 (three) days. 30 each 3  ? levonorgestrel (MIRENA) 20 MCG/24HR IUD by Intrauterine route.    ? LORazepam (ATIVAN) 1 MG tablet Take 1 mg  by mouth every 6 (six) hours as needed for anxiety.    ? meclizine (ANTIVERT) 12.5 MG tablet Take 1 tablet (12.5 mg total) by mouth 3 (three) times daily as needed for dizziness. 30 tablet 0  ? metFORMIN (GLUCOPHAGE) 500 MG tablet TAKE 1 TABLET BY MOUTH 2 TIMES DAILY WITH A MEAL. (Patient taking differently: Take 500 mg by mouth 2 (two) times daily with a meal.) 180 tablet 1  ? mycophenolate (CELLCEPT) 250 MG capsule Take 500-750 mg by mouth See admin instructions. 500 mg every morning  ?750 mg every evening    ? ondansetron (ZOFRAN-ODT) 4 MG disintegrating tablet Take 1 tablet (4 mg total) by mouth every 8 (eight) hours as needed for nausea or vomiting. 20 tablet 0  ? pantoprazole (PROTONIX) 40 MG tablet Take 40 mg by mouth 2 (two) times daily.     ? predniSONE (DELTASONE) 5 MG tablet Take 5 mg by mouth daily.    ? QUEtiapine (SEROQUEL) 50 MG tablet Take 50 mg by mouth at bedtime as needed (sleep).    ? tacrolimus (PROGRAF) 0.5 MG capsule Take 2.5-3 mg by mouth See admin instructions. 2.5 mg every morning  ?3 mg every evening    ? traZODone (DESYREL) 50 MG tablet Take 50-100 mg by mouth at bedtime as needed for sleep.    ? trimethoprim (TRIMPEX) 100 MG tablet Take  100 mg by mouth daily.    ? WEGOVY 2.4 MG/0.75ML SOAJ INJECT 2.4 MG INTO THE SKIN ONCE A WEEK. (Patient taking differently: Inject 2.4 mg into the skin every Saturday.) 9 mL 1  ? ?No current facility-administe

## 2022-01-07 LAB — THYROID PANEL WITH TSH
Free Thyroxine Index: 2.5 (ref 1.2–4.9)
T3 Uptake Ratio: 30 % (ref 24–39)
T4, Total: 8.4 ug/dL (ref 4.5–12.0)
TSH: 2.39 u[IU]/mL (ref 0.450–4.500)

## 2022-01-11 ENCOUNTER — Telehealth: Payer: Self-pay | Admitting: Neurology

## 2022-01-11 ENCOUNTER — Other Ambulatory Visit: Payer: Self-pay | Admitting: Internal Medicine

## 2022-01-11 NOTE — Telephone Encounter (Signed)
[employer] pending uploaded notes  

## 2022-01-12 ENCOUNTER — Encounter: Payer: Self-pay | Admitting: Internal Medicine

## 2022-01-14 NOTE — Telephone Encounter (Signed)
[employer] did not approve the MRI Cervical spine. ? ?"Based on the imaging guidelines this request cannot be approved because: ?Imaging requires six weeks of provider directed treatment to be completed. Supported treatments include (but are not limited to) drugs for swelling or pain, an in office work out (PT), and/or oral or injected steroids. This must have been completed in the past three months without improved symptoms. Contact (via office visit, phone, emial, or messaging) must occur after the treatment is completed. ? ?There is an option to do a peer to peer. They informed me the peer to peer would expire on May 3. The case number is [id number] and the phone number is [phone]. ?

## 2022-01-18 NOTE — Telephone Encounter (Signed)
MRI cervical ? ? ?[id number] ?

## 2022-01-18 NOTE — Telephone Encounter (Signed)
Please connect me peer to peer Review ?

## 2022-01-18 NOTE — Telephone Encounter (Signed)
I called [employer] to set up the peer-to-peer call. ? ?Dr. Wayne Both will call Dr. Krista Blue on her mobile number to complete this case. ? ?SCHEDULED FOR TODAY AT 12:45PM. ? ? ?

## 2022-01-19 NOTE — Telephone Encounter (Signed)
Noted, thank you LVM for pt to call back to schedule  ?[employer] Josem Kaufmann: [id number] (exp. 01/18/22 to 03/04/22)  ?

## 2022-01-25 NOTE — Telephone Encounter (Signed)
Patient returned my call she is scheduled at [facility] for 01/27/22  ?

## 2022-01-26 NOTE — Telephone Encounter (Signed)
Patient r/s for 02/03/22.  ?

## 2022-01-27 ENCOUNTER — Other Ambulatory Visit: Payer: 59

## 2022-02-02 ENCOUNTER — Ambulatory Visit (INDEPENDENT_AMBULATORY_CARE_PROVIDER_SITE_OTHER): Payer: 59

## 2022-02-02 VITALS — BP 104/80 | HR 90 | Temp 98.2°F | Ht 63.0 in | Wt 159.8 lb

## 2022-02-02 DIAGNOSIS — Z23 Encounter for immunization: Secondary | ICD-10-CM

## 2022-02-02 NOTE — Progress Notes (Signed)
Pt presents today for second shingrix.  

## 2022-02-03 ENCOUNTER — Ambulatory Visit (INDEPENDENT_AMBULATORY_CARE_PROVIDER_SITE_OTHER): Payer: 59

## 2022-02-03 DIAGNOSIS — R200 Anesthesia of skin: Secondary | ICD-10-CM

## 2022-02-03 DIAGNOSIS — M542 Cervicalgia: Secondary | ICD-10-CM | POA: Diagnosis not present

## 2022-02-03 DIAGNOSIS — R251 Tremor, unspecified: Secondary | ICD-10-CM

## 2022-02-03 DIAGNOSIS — R531 Weakness: Secondary | ICD-10-CM | POA: Diagnosis not present

## 2022-02-18 ENCOUNTER — Encounter: Payer: Self-pay | Admitting: Internal Medicine

## 2022-02-18 ENCOUNTER — Other Ambulatory Visit: Payer: Self-pay

## 2022-02-18 DIAGNOSIS — E1122 Type 2 diabetes mellitus with diabetic chronic kidney disease: Secondary | ICD-10-CM

## 2022-02-18 MED ORDER — DEXCOM G6 TRANSMITTER MISC: 3 refills | Status: DC

## 2022-03-03 ENCOUNTER — Other Ambulatory Visit: Payer: Self-pay | Admitting: Internal Medicine

## 2022-03-29 ENCOUNTER — Encounter: Payer: Self-pay | Admitting: Internal Medicine
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
## 2022-04-16 ENCOUNTER — Ambulatory Visit: Payer: 59 | Admitting: Internal Medicine

## 2022-04-21 NOTE — Progress Notes (Deleted)
No chief complaint on file.   HISTORY OF PRESENT ILLNESS:  04/21/22 ALL:  [name] is a 46 y.o. female here today for follow up for tremor. She was seen in consult for left hand tremor by Dr Krista Blue 01/2022. MRI cervical spine was normal. She was started on propranolol '10mg'$  TID.    HISTORY (copied from Dr Rhea Belton previous note)  [name], is a 46 year old female seen in request by Dr. Iran Planas, for evaluation of left hand tremor, initial evaluation was on January 06, 2022   I reviewed and summarized the referring note.PMHx HTN HLD DM  S/p kidney transplant in 2011, peritoneal dialysis for 2000-2008, HD 2008-2011 Anxiety, chronic insomnia   Patient had a long history of diabetes, previously was not under control, leading to kidney failure, was on peritoneal dialysis from 2000-2008, then hemodialysis from 2008-2011, eventually had kidney transplant in 2011, since then, she has been doing very well, she had left arm AV fistula,  She also had history of bilateral ulnar decompression surgery in the past, with residual right hand weakness, curling of fourth and fifth finger, but improved compared to presurgery, she also has a left hand fourth and fifth finger numbness, despite previous decompression surgery of left ulnar across the elbow, she actually complaining of worsening left hand paresthesia on the ulnar side over the past few months  She is right-handed, noticed left hand tremor while typing, it does interrupt her job performance, she has chronic neck pain, worsening neck pain recently, radiating pain to left shoulder   She denies gait abnormality, denies bilateral foot and lower extremity paresthesia  REVIEW OF SYSTEMS: Out of a complete 14 system review of symptoms, the patient complains only of the following symptoms, and all other reviewed systems are negative.   ALLERGIES: Allergies  Allergen Reactions   Adhesive [Tape] Other (See Comments)    "plastic tape"  irritates skin   Ok with paper tape   Strawberry (Diagnostic)    Wound Dressing Adhesive     Other reaction(s): Other "plastic tape" irritates skin   Ok with paper tape     HOME MEDICATIONS: Outpatient Medications Prior to Visit  Medication Sig Dispense Refill   albuterol (VENTOLIN HFA) 108 (90 Base) MCG/ACT inhaler Inhale 1-2 puffs into the lungs every 4 (four) hours as needed for wheezing or shortness of breath.     amLODipine (NORVASC) 10 MG tablet Take 10 mg by mouth every evening.     atorvastatin (LIPITOR) 10 MG tablet Take 10 mg by mouth at bedtime.     azelastine (OPTIVAR) 0.05 % ophthalmic solution Place 2 drops into both eyes 2 (two) times daily as needed (allergies).     BAQSIMI ONE PACK 3 MG/DOSE POWD PLACE 3 MG INTO THE NOSE ONCE AS NEEDED FOR UP TO 1 DOSE. (Patient taking differently: Place 3 mg into the nose as needed (blood sugar drops below 60).) 1 each 6   BD PEN NEEDLE NANO 2ND GEN 32G X 4 MM MISC USE 3X A DAY 300 each 3   Cholecalciferol (VITAMIN D3) 125 MCG (5000 UT) CAPS Take 5,000 Units by mouth daily.     Continuous Blood Gluc Receiver (DEXCOM G6 RECEIVER) DEVI Use as instructed to check blood sugar 4 times daily 1 each 0   Continuous Blood Gluc Sensor (DEXCOM G6 SENSOR) MISC Use as instructed change every 10 days 9 each 3   Continuous Blood Gluc Transmit (DEXCOM G6 TRANSMITTER) MISC Use as instructed change  every 90 days 1 each 3   diphenhydrAMINE (BENADRYL) 25 MG tablet Take 50 mg by mouth every 6 (six) hours as needed for itching or allergies.     famotidine (PEPCID) 20 MG tablet Take 1 tablet (20 mg total) by mouth 2 (two) times daily. 30 tablet 0   FIASP 100 UNIT/ML SOLN USE UP TO 60 UNITS A DAY IN THE INSULIN PUMP (Patient taking differently: Use up to 60 units per day in insulin pump) 60 mL 3   gabapentin (NEURONTIN) 300 MG capsule TAKE 1 CAPSULE BY MOUTH THREE TIMES A DAY (Patient taking differently: Take 300 mg by mouth 3 (three) times daily.) 90 capsule 3    Insulin Disposable Pump (OMNIPOD 5 G6 POD, GEN 5,) MISC 1 each by Does not apply route every 3 (three) days. 30 each 3   levonorgestrel (MIRENA) 20 MCG/24HR IUD by Intrauterine route.     LORazepam (ATIVAN) 1 MG tablet Take 1 mg by mouth every 6 (six) hours as needed for anxiety.     meclizine (ANTIVERT) 12.5 MG tablet Take 1 tablet (12.5 mg total) by mouth 3 (three) times daily as needed for dizziness. 30 tablet 0   metFORMIN (GLUCOPHAGE) 500 MG tablet TAKE 1 TABLET BY MOUTH 2 TIMES DAILY WITH A MEAL. (Patient taking differently: Take 500 mg by mouth 2 (two) times daily with a meal.) 180 tablet 1   mycophenolate (CELLCEPT) 250 MG capsule Take 500-750 mg by mouth See admin instructions. 500 mg every morning  750 mg every evening     ondansetron (ZOFRAN-ODT) 4 MG disintegrating tablet Take 1 tablet (4 mg total) by mouth every 8 (eight) hours as needed for nausea or vomiting. 20 tablet 0   pantoprazole (PROTONIX) 40 MG tablet Take 40 mg by mouth 2 (two) times daily.      predniSONE (DELTASONE) 5 MG tablet Take 5 mg by mouth daily.     propranolol (INDERAL) 20 MG tablet Take 0.5 tablets (10 mg total) by mouth 3 (three) times daily as needed. 90 tablet 11   QUEtiapine (SEROQUEL) 50 MG tablet Take 50 mg by mouth at bedtime as needed (sleep).     tacrolimus (PROGRAF) 0.5 MG capsule Take 2.5-3 mg by mouth See admin instructions. 2.5 mg every morning  3 mg every evening     traZODone (DESYREL) 50 MG tablet Take 50-100 mg by mouth at bedtime as needed for sleep.     trimethoprim (TRIMPEX) 100 MG tablet Take 100 mg by mouth daily.     WEGOVY 2.4 MG/0.75ML SOAJ INJECT 2.4 MG INTO THE SKIN ONCE A WEEK. (Patient taking differently: Inject 2.4 mg into the skin every Saturday.) 9 mL 1   No facility-administered medications prior to visit.     PAST MEDICAL HISTORY: Past Medical History:  Diagnosis Date   Arthritis    Diabetes mellitus    HTN (hypertension)    Hyperlipidemia    Pyelonephritis 09/30/11    Renal failure    S/P kidney transplant      PAST SURGICAL HISTORY: Past Surgical History:  Procedure Laterality Date   CARPAL TUNNEL RELEASE Right 11/02/2018   Procedure: RIGHT CARPAL TUNNEL RELEASE;  Surgeon: Leanora Cover, MD;  Location: [facility];  Service: Orthopedics;  Laterality: Right;   CESAREAN SECTION  1996   KIDNEY TRANSPLANT  03/2010   right   loop av graft  05/2009   left upper arm   PERITONEAL CATHETER INSERTION  ~ 2003   TUBAL LIGATION  2000   ULNAR NERVE TRANSPOSITION Right 11/02/2018   Procedure: ULNAR NERVE DECOMPRESSION;  Surgeon: Leanora Cover, MD;  Location: [facility];  Service: Orthopedics;  Laterality: Right;     FAMILY HISTORY: Family History  Problem Relation Age of Onset   Diabetes Mother    Arthritis Mother    Arthritis Father    Prostate cancer Father    Hypertension Father    Breast cancer Maternal Aunt    Breast cancer Paternal Aunt      SOCIAL HISTORY: Social History   Socioeconomic History   Marital status: Married    Spouse name: Not on file   Number of children: Not on file   Years of education: Not on file   Highest education level: Not on file  Occupational History   Not on file  Tobacco Use   Smoking status: Never   Smokeless tobacco: Never  Vaping Use   Vaping Use: Never used  Substance and Sexual Activity   Alcohol use: No   Drug use: No   Sexual activity: Yes    Birth control/protection: I.U.D.  Other Topics Concern   Not on file  Social History Narrative   Not on file   Social Determinants of Health   Financial Resource Strain: Not on file  Food Insecurity: Not on file  Transportation Needs: Not on file  Physical Activity: Not on file  Stress: Not on file  Social Connections: Not on file  Intimate Partner Violence: Not on file     PHYSICAL EXAM  There were no vitals filed for this visit. There is no height or weight on file to calculate BMI.  Generalized: Well developed, in  no acute distress  Cardiology: normal rate and rhythm, no murmur auscultated  Respiratory: clear to auscultation bilaterally    Neurological examination  Mentation: Alert oriented to time, place, history taking. Follows all commands speech and language fluent Cranial nerve II-XII: Pupils were equal round reactive to light. Extraocular movements were full, visual field were full on confrontational test. Facial sensation and strength were normal. Uvula tongue midline. Head turning and shoulder shrug  were normal and symmetric. Motor: The motor testing reveals 5 over 5 strength of all 4 extremities. Good symmetric motor tone is noted throughout.  Sensory: Sensory testing is intact to soft touch on all 4 extremities. No evidence of extinction is noted.  Coordination: Cerebellar testing reveals good finger-nose-finger and heel-to-shin bilaterally.  Gait and station: Gait is normal. Tandem gait is normal. Romberg is negative. No drift is seen.  Reflexes: Deep tendon reflexes are symmetric and normal bilaterally.    DIAGNOSTIC DATA (LABS, IMAGING, TESTING) - I reviewed patient records, labs, notes, testing and imaging myself where available.  Lab Results  Component Value Date   WBC 5.6 12/21/2021   HGB 12.5 12/21/2021   HCT 39.3 12/21/2021   MCV 90.3 12/21/2021   PLT 206 12/21/2021      Component Value Date/Time   NA 137 12/21/2021 0106   NA 142 12/30/2020 0000   K 3.8 12/21/2021 0106   CL 102 12/21/2021 0106   CO2 23 12/21/2021 0106   GLUCOSE 92 12/21/2021 0106   BUN 11 12/21/2021 0106   BUN 13 12/30/2020 0000   CREATININE 1.56 (H) 12/21/2021 0106   CALCIUM 9.5 12/21/2021 0106   PROT 7.0 12/21/2021 0106   PROT 6.4 07/15/2021 1535   ALBUMIN 4.1 12/21/2021 0106   ALBUMIN 4.2 07/15/2021 1535   AST 20 12/21/2021 0106  ALT 15 12/21/2021 0106   ALKPHOS 82 12/21/2021 0106   BILITOT 1.0 12/21/2021 0106   BILITOT 0.3 07/15/2021 1535   GFRNONAA 41 (L) 12/21/2021 0106   GFRAA 56  03/07/2020 0000   Lab Results  Component Value Date   CHOL 115 01/01/2022   HDL 45 01/01/2022   LDLCALC 53 01/01/2022   TRIG 88 01/01/2022   CHOLHDL 2.5 01/13/2021   Lab Results  Component Value Date   HGBA1C 5.7 (A) 11/20/2021   No results found for: "VITAMINB12" Lab Results  Component Value Date   TSH 2.390 01/06/2022        No data to display               No data to display           ASSESSMENT AND PLAN  46 y.o. year old female  has a past medical history of Arthritis, Diabetes mellitus, HTN (hypertension), Hyperlipidemia, Pyelonephritis (09/30/11), Renal failure, and S/P kidney transplant. here with    No diagnosis found.  Hortencia Conradi ***.  Healthy lifestyle habits encouraged. *** will follow up with PCP as directed. *** will return to see me in ***, sooner if needed. *** verbalizes understanding and agreement with this plan.   No orders of the defined types were placed in this encounter.    No orders of the defined types were placed in this encounter.    Debbora Presto, MSN, FNP-C 04/21/2022, 2:42 PM  [facility] [address] [place], [place] [address] [phone]

## 2022-04-22 ENCOUNTER — Encounter: Payer: Self-pay | Admitting: Family Medicine

## 2022-04-22 ENCOUNTER — Ambulatory Visit: Payer: 59 | Admitting: Family Medicine

## 2022-04-22 DIAGNOSIS — R251 Tremor, unspecified: Secondary | ICD-10-CM

## 2022-05-10 ENCOUNTER — Other Ambulatory Visit: Payer: Self-pay

## 2022-05-10 ENCOUNTER — Encounter: Payer: Self-pay | Admitting: Internal Medicine

## 2022-05-10 DIAGNOSIS — N182 Chronic kidney disease, stage 2 (mild): Secondary | ICD-10-CM

## 2022-05-10 MED ORDER — DEXCOM G6 SENSOR MISC: 3 refills | Status: DC

## 2022-05-18 ENCOUNTER — Other Ambulatory Visit: Payer: Self-pay | Admitting: Internal Medicine

## 2022-05-20 ENCOUNTER — Other Ambulatory Visit: Payer: Self-pay | Admitting: Internal Medicine

## 2022-05-20 DIAGNOSIS — Z1231 Encounter for screening mammogram for malignant neoplasm of breast: Secondary | ICD-10-CM

## 2022-05-21 DIAGNOSIS — D123 Benign neoplasm of transverse colon: Secondary | ICD-10-CM | POA: Diagnosis not present

## 2022-05-21 DIAGNOSIS — D122 Benign neoplasm of ascending colon: Secondary | ICD-10-CM | POA: Diagnosis not present

## 2022-05-25 LAB — COMPREHENSIVE METABOLIC PANEL
Albumin: 4.2 (ref 3.5–5.0)
Calcium: 9.4 (ref 8.7–10.7)
Globulin: 2
eGFR: 47

## 2022-05-25 LAB — CBC AND DIFFERENTIAL
HCT: 35 — AB (ref 36–46)
Hemoglobin: 11.7 — AB (ref 12.0–16.0)

## 2022-05-25 LAB — BASIC METABOLIC PANEL
BUN: 14 (ref 4–21)
CO2: 110 — AB (ref 13–22)
Chloride: 110 — AB (ref 99–108)
Creatinine: 1.4 — AB (ref 0.5–1.1)
Glucose: 99
Potassium: 4.2 mEq/L (ref 3.5–5.1)
Sodium: 143 (ref 137–147)

## 2022-05-25 LAB — IRON,TIBC AND FERRITIN PANEL
%SAT: 26
Ferritin: 178
Iron: 50
TIBC: 189
UIBC: 139

## 2022-05-25 LAB — HEPATIC FUNCTION PANEL
ALT: 14 U/L (ref 7–35)
AST: 15 (ref 13–35)
Alkaline Phosphatase: 96 (ref 25–125)
Bilirubin, Total: 0.4

## 2022-05-25 LAB — HEMOGLOBIN A1C: Hemoglobin A1C: 5.2

## 2022-06-02 ENCOUNTER — Other Ambulatory Visit (HOSPITAL_COMMUNITY): Payer: Self-pay

## 2022-06-04 ENCOUNTER — Ambulatory Visit: Payer: 59

## 2022-06-08 ENCOUNTER — Other Ambulatory Visit: Payer: Self-pay | Admitting: Internal Medicine

## 2022-06-08 ENCOUNTER — Encounter: Payer: Self-pay | Admitting: Internal Medicine

## 2022-06-09 ENCOUNTER — Other Ambulatory Visit: Payer: Self-pay | Admitting: Internal Medicine

## 2022-06-09 DIAGNOSIS — Z6829 Body mass index (BMI) 29.0-29.9, adult: Secondary | ICD-10-CM

## 2022-06-09 DIAGNOSIS — I129 Hypertensive chronic kidney disease with stage 1 through stage 4 chronic kidney disease, or unspecified chronic kidney disease: Secondary | ICD-10-CM

## 2022-06-09 DIAGNOSIS — E1122 Type 2 diabetes mellitus with diabetic chronic kidney disease: Secondary | ICD-10-CM

## 2022-06-13 ENCOUNTER — Other Ambulatory Visit: Payer: Self-pay | Admitting: Internal Medicine

## 2022-06-15 ENCOUNTER — Ambulatory Visit
Admission: RE | Admit: 2022-06-15 | Discharge: 2022-06-15 | Disposition: A | Discharge status: Home / Self Care | Payer: 59 | Source: Ambulatory Visit | Attending: Internal Medicine | Admitting: Internal Medicine

## 2022-06-15 DIAGNOSIS — Z1231 Encounter for screening mammogram for malignant neoplasm of breast: Secondary | ICD-10-CM

## 2022-07-06 ENCOUNTER — Encounter: Payer: Self-pay | Admitting: Internal Medicine

## 2022-07-06 ENCOUNTER — Ambulatory Visit (INDEPENDENT_AMBULATORY_CARE_PROVIDER_SITE_OTHER): Payer: 59 | Admitting: Internal Medicine

## 2022-07-06 VITALS — BP 110/60 | HR 86 | Temp 98.0°F | Ht 63.0 in | Wt 133.4 lb

## 2022-07-06 DIAGNOSIS — E1122 Type 2 diabetes mellitus with diabetic chronic kidney disease: Secondary | ICD-10-CM

## 2022-07-06 DIAGNOSIS — I129 Hypertensive chronic kidney disease with stage 1 through stage 4 chronic kidney disease, or unspecified chronic kidney disease: Secondary | ICD-10-CM | POA: Diagnosis not present

## 2022-07-06 DIAGNOSIS — Z6823 Body mass index (BMI) 23.0-23.9, adult: Secondary | ICD-10-CM | POA: Diagnosis not present

## 2022-07-06 DIAGNOSIS — Z794 Long term (current) use of insulin: Secondary | ICD-10-CM

## 2022-07-06 DIAGNOSIS — N182 Chronic kidney disease, stage 2 (mild): Secondary | ICD-10-CM | POA: Diagnosis not present

## 2022-07-06 NOTE — Patient Instructions (Signed)
Hypertension, Adult ?Hypertension is another name for high blood pressure. High blood pressure forces your heart to work harder to pump blood. This can cause problems over time. ?There are two numbers in a blood pressure reading. There is a top number (systolic) over a bottom number (diastolic). It is best to have a blood pressure that is below 120/80. ?What are the causes? ?The cause of this condition is not known. Some other conditions can lead to high blood pressure. ?What increases the risk? ?Some lifestyle factors can make you more likely to develop high blood pressure: ?Smoking. ?Not getting enough exercise or physical activity. ?Being overweight. ?Having too much fat, sugar, calories, or salt (sodium) in your diet. ?Drinking too much alcohol. ?Other risk factors include: ?Having any of these conditions: ?Heart disease. ?Diabetes. ?High cholesterol. ?Kidney disease. ?Obstructive sleep apnea. ?Having a family history of high blood pressure and high cholesterol. ?Age. The risk increases with age. ?Stress. ?What are the signs or symptoms? ?High blood pressure may not cause symptoms. Very high blood pressure (hypertensive crisis) may cause: ?Headache. ?Fast or uneven heartbeats (palpitations). ?Shortness of breath. ?Nosebleed. ?Vomiting or feeling like you may vomit (nauseous). ?Changes in how you see. ?Very bad chest pain. ?Feeling dizzy. ?Seizures. ?How is this treated? ?This condition is treated by making healthy lifestyle changes, such as: ?Eating healthy foods. ?Exercising more. ?Drinking less alcohol. ?Your doctor may prescribe medicine if lifestyle changes do not help enough and if: ?Your top number is above 130. ?Your bottom number is above 80. ?Your personal target blood pressure may vary. ?Follow these instructions at home: ?Eating and drinking ? ?If told, follow the DASH eating plan. To follow this plan: ?Fill one half of your plate at each meal with fruits and vegetables. ?Fill one fourth of your plate  at each meal with whole grains. Whole grains include whole-wheat pasta, brown rice, and whole-grain bread. ?Eat or drink low-fat dairy products, such as skim milk or low-fat yogurt. ?Fill one fourth of your plate at each meal with low-fat (lean) proteins. Low-fat proteins include fish, chicken without skin, eggs, beans, and tofu. ?Avoid fatty meat, cured and processed meat, or chicken with skin. ?Avoid pre-made or processed food. ?Limit the amount of salt in your diet to less than 1,500 mg each day. ?Do not drink alcohol if: ?Your doctor tells you not to drink. ?You are pregnant, may be pregnant, or are planning to become pregnant. ?If you drink alcohol: ?Limit how much you have to: ?0-1 drink a day for women. ?0-2 drinks a day for men. ?Know how much alcohol is in your drink. In the [place] one drink equals one 12 oz bottle of beer (355 mL), one 5 oz glass of wine (148 mL), or one 1? oz glass of hard liquor (44 mL). ?Lifestyle ? ?Work with your doctor to stay at a healthy weight or to lose weight. Ask your doctor what the best weight is for you. ?Get at least 30 minutes of exercise that causes your heart to beat faster (aerobic exercise) most days of the week. This may include walking, swimming, or biking. ?Get at least 30 minutes of exercise that strengthens your muscles (resistance exercise) at least 3 days a week. This may include lifting weights or doing Pilates. ?Do not smoke or use any products that contain nicotine or tobacco. If you need help quitting, ask your doctor. ?Check your blood pressure at home as told by your doctor. ?Keep all follow-up visits. ?Medicines ?Take over-the-counter and prescription medicines   only as told by your doctor. Follow directions carefully. ?Do not skip doses of blood pressure medicine. The medicine does not work as well if you skip doses. Skipping doses also puts you at risk for problems. ?Ask your doctor about side effects or reactions to medicines that you should watch  for. ?Contact a doctor if: ?You think you are having a reaction to the medicine you are taking. ?You have headaches that keep coming back. ?You feel dizzy. ?You have swelling in your ankles. ?You have trouble with your vision. ?Get help right away if: ?You get a very bad headache. ?You start to feel mixed up (confused). ?You feel weak or numb. ?You feel faint. ?You have very bad pain in your: ?Chest. ?Belly (abdomen). ?You vomit more than once. ?You have trouble breathing. ?These symptoms may be an emergency. Get help right away. Call 911. ?Do not wait to see if the symptoms will go away. ?Do not drive yourself to the hospital. ?Summary ?Hypertension is another name for high blood pressure. ?High blood pressure forces your heart to work harder to pump blood. ?For most people, a normal blood pressure is less than 120/80. ?Making healthy choices can help lower blood pressure. If your blood pressure does not get lower with healthy choices, you may need to take medicine. ?This information is not intended to replace advice given to you by your health care provider. Make sure you discuss any questions you have with your health care provider. ?Document Revised: 07/09/2021 Document Reviewed: 07/09/2021 ?Elsevier Patient Education ? 2023 Elsevier Inc. ? ?

## 2022-07-06 NOTE — Progress Notes (Signed)
[name],acting as a Education administrator for [name], MD.,have documented all relevant documentation on the behalf of [name], MD,as directed by  [name], MD while in the presence of [name], MD.    Subjective:     Patient ID: [name] , female    DOB: June 02, 1976 , 46 y.o.   MRN: [id number]   Chief Complaint  Patient presents with   Hypertension   Weight Check    HPI  The patient is here today for a htn and obesity f/u. She reports compliance with meds.   Hypertension This is a chronic problem. The current episode started more than 1 year ago. The problem is controlled. Risk factors for coronary artery disease include diabetes mellitus, dyslipidemia and obesity. The current treatment provides moderate improvement. Hypertensive end-organ damage includes kidney disease. Identifiable causes of hypertension include chronic renal disease.     Past Medical History:  Diagnosis Date   Arthritis    Diabetes mellitus    HTN (hypertension)    Hyperlipidemia    Pyelonephritis 09/30/11   Renal failure    S/P kidney transplant      Family History  Problem Relation Age of Onset   Diabetes Mother    Arthritis Mother    Arthritis Father    Prostate cancer Father    Hypertension Father    Breast cancer Maternal Aunt    Breast cancer Paternal Aunt      Current Outpatient Medications:    albuterol (VENTOLIN HFA) 108 (90 Base) MCG/ACT inhaler, Inhale 1-2 puffs into the lungs every 4 (four) hours as needed for wheezing or shortness of breath., Disp: , Rfl:    amLODipine (NORVASC) 10 MG tablet, Take 10 mg by mouth every evening., Disp: , Rfl:    atorvastatin (LIPITOR) 10 MG tablet, Take 10 mg by mouth at bedtime., Disp: , Rfl:    azelastine (OPTIVAR) 0.05 % ophthalmic solution, Place 2 drops into both eyes 2 (two) times daily as needed (allergies)., Disp: , Rfl:    BAQSIMI ONE PACK 3 MG/DOSE POWD, PLACE 3 MG INTO THE NOSE ONCE AS NEEDED FOR UP TO 1  DOSE., Disp: 1 each, Rfl: 6   BD PEN NEEDLE NANO 2ND GEN 32G X 4 MM MISC, USE 3X A DAY, Disp: 300 each, Rfl: 3   Cholecalciferol (VITAMIN D3) 125 MCG (5000 UT) CAPS, Take 5,000 Units by mouth daily., Disp: , Rfl:    Continuous Blood Gluc Receiver (Joyce) DEVI, Use as instructed to check blood sugar 4 times daily, Disp: 1 each, Rfl: 0   Continuous Blood Gluc Sensor (DEXCOM G6 SENSOR) MISC, Use as instructed change every 10 days, Disp: 9 each, Rfl: 3   Continuous Blood Gluc Transmit (DEXCOM G6 TRANSMITTER) MISC, Use as instructed change every 90 days, Disp: 1 each, Rfl: 3   diphenhydrAMINE (BENADRYL) 25 MG tablet, Take 50 mg by mouth every 6 (six) hours as needed for itching or allergies., Disp: , Rfl:    FIASP 100 UNIT/ML SOLN, USE UP TO 60 UNITS A DAY IN THE INSULIN PUMP (Patient taking differently: Use up to 60 units per day in insulin pump), Disp: 60 mL, Rfl: 3   Insulin Disposable Pump (OMNIPOD 5 G6 POD, GEN 5,) MISC, 1 each by Does not apply route every 3 (three) days., Disp: 30 each, Rfl: 3   levonorgestrel (MIRENA) 20 MCG/24HR IUD, by Intrauterine route., Disp: , Rfl:    LORazepam (ATIVAN) 1 MG tablet,  Take 1 mg by mouth every 6 (six) hours as needed for anxiety., Disp: , Rfl:    metFORMIN (GLUCOPHAGE) 500 MG tablet, TAKE 1 TABLET BY MOUTH 2 TIMES DAILY WITH A MEAL. (Patient taking differently: Take 500 mg by mouth 2 (two) times daily with a meal.), Disp: 180 tablet, Rfl: 1   mycophenolate (CELLCEPT) 250 MG capsule, Take 500-750 mg by mouth See admin instructions. 500 mg every morning  750 mg every evening, Disp: , Rfl:    ondansetron (ZOFRAN-ODT) 4 MG disintegrating tablet, Take 1 tablet (4 mg total) by mouth every 8 (eight) hours as needed for nausea or vomiting., Disp: 20 tablet, Rfl: 0   pantoprazole (PROTONIX) 40 MG tablet, Take 40 mg by mouth 2 (two) times daily. , Disp: , Rfl:    predniSONE (DELTASONE) 5 MG tablet, Take 5 mg by mouth daily., Disp: , Rfl:    propranolol  (INDERAL) 20 MG tablet, Take 0.5 tablets (10 mg total) by mouth 3 (three) times daily as needed., Disp: 90 tablet, Rfl: 11   QUEtiapine (SEROQUEL) 50 MG tablet, Take 50 mg by mouth at bedtime as needed (sleep)., Disp: , Rfl:    Semaglutide-Weight Management (WEGOVY) 2.4 MG/0.75ML SOAJ, Inject 2.4 mg into the skin every Saturday., Disp: 9 mL, Rfl: 1   tacrolimus (PROGRAF) 0.5 MG capsule, Take 2.5-3 mg by mouth See admin instructions. 2.5 mg every morning  3 mg every evening, Disp: , Rfl:    traZODone (DESYREL) 50 MG tablet, Take 50-100 mg by mouth at bedtime as needed for sleep., Disp: , Rfl:    trimethoprim (TRIMPEX) 100 MG tablet, Take 100 mg by mouth daily., Disp: , Rfl:    meclizine (ANTIVERT) 12.5 MG tablet, Take 1 tablet (12.5 mg total) by mouth 3 (three) times daily as needed for dizziness. (Patient not taking: Reported on 07/06/2022), Disp: 30 tablet, Rfl: 0   Allergies  Allergen Reactions   Adhesive [Tape] Other (See Comments)    "plastic tape" irritates skin   Ok with paper tape   Strawberry (Diagnostic)    Wound Dressing Adhesive     Other reaction(s): Other "plastic tape" irritates skin   Ok with paper tape     Review of Systems  Constitutional: Negative.   Eyes: Negative.   Respiratory: Negative.    Cardiovascular: Negative.   Musculoskeletal: Negative.   Skin: Negative.   Neurological: Negative.   Psychiatric/Behavioral: Negative.       Today's Vitals   07/06/22 0821  BP: 110/60  Pulse: 86  Temp: 98 F (36.7 C)  Weight: 133 lb 6.4 oz (60.5 kg)  Height: '5\' 3"'$  (1.6 m)  PainSc: 0-No pain   Body mass index is 23.63 kg/m.  Wt Readings from Last 3 Encounters:  07/06/22 133 lb 6.4 oz (60.5 kg)  02/02/22 159 lb 12.8 oz (72.5 kg)  01/06/22 162 lb 8 oz (73.7 kg)     Objective:  Physical Exam Vitals and nursing note reviewed.  Constitutional:      Appearance: Normal appearance.  HENT:     Head: Normocephalic and atraumatic.  Eyes:     Extraocular Movements:  Extraocular movements intact.  Cardiovascular:     Rate and Rhythm: Normal rate and regular rhythm.     Heart sounds: Normal heart sounds.  Pulmonary:     Effort: Pulmonary effort is normal.     Breath sounds: Normal breath sounds.  Musculoskeletal:     Cervical back: Normal range of motion.  Skin:  General: Skin is warm.  Neurological:     General: No focal deficit present.     Mental Status: She is alert.  Psychiatric:        Mood and Affect: Mood normal.        Behavior: Behavior normal.         Assessment And Plan:     1. Hypertensive nephropathy Comments: Chronic, well controlled w/ amlodipine '10mg'$  daily. She will c/w current meds and follow low sodium diet. Her most recent labs from Dr. Arty Baumgartner were reviewed in full detail.   2. Type 2 diabetes mellitus with stage 2 chronic kidney disease, with long-term current use of insulin (HCC) Comments: Chronic, Endo input appreciated. Recent GFR is 47, I think she can d/c metformin. Her a1c was 5.2 on 06/09/22. I think her dose of semaglutide should be reduced, she has surpassed her goal weight. I will defer final decision to Endo.   3. BMI 23.0-23.9, adult Comments: She has surpassed her goal weight of 150. I think she should change to qoweek dosing of 2.4 until she runs out, and then change to 1.'7mg'$  weekly.    Patient was given opportunity to ask questions. Patient verbalized understanding of the plan and was able to repeat key elements of the plan. All questions were answered to their satisfaction.   I, [name], MD, have reviewed all documentation for this visit. The documentation on 07/06/22 for the exam, diagnosis, procedures, and orders are all accurate and complete.   IF YOU HAVE BEEN REFERRED TO A SPECIALIST, IT MAY TAKE 1-2 WEEKS TO SCHEDULE/PROCESS THE REFERRAL. IF YOU HAVE NOT HEARD FROM US/SPECIALIST IN TWO WEEKS, PLEASE GIVE [place] A CALL AT [phone].   THE PATIENT IS ENCOURAGED TO PRACTICE SOCIAL  DISTANCING DUE TO THE COVID-19 PANDEMIC.

## 2022-07-12 ENCOUNTER — Encounter: Payer: Self-pay | Admitting: Internal Medicine

## 2022-07-12 ENCOUNTER — Ambulatory Visit (INDEPENDENT_AMBULATORY_CARE_PROVIDER_SITE_OTHER): Payer: 59 | Admitting: Internal Medicine

## 2022-07-12 ENCOUNTER — Other Ambulatory Visit: Payer: Self-pay

## 2022-07-12 VITALS — BP 124/80 | HR 75 | Ht 63.0 in | Wt 138.2 lb

## 2022-07-12 DIAGNOSIS — Z6830 Body mass index (BMI) 30.0-30.9, adult: Secondary | ICD-10-CM

## 2022-07-12 DIAGNOSIS — E1122 Type 2 diabetes mellitus with diabetic chronic kidney disease: Secondary | ICD-10-CM | POA: Diagnosis not present

## 2022-07-12 DIAGNOSIS — E1169 Type 2 diabetes mellitus with other specified complication: Secondary | ICD-10-CM

## 2022-07-12 DIAGNOSIS — N182 Chronic kidney disease, stage 2 (mild): Secondary | ICD-10-CM

## 2022-07-12 DIAGNOSIS — Z794 Long term (current) use of insulin: Secondary | ICD-10-CM

## 2022-07-12 DIAGNOSIS — E785 Hyperlipidemia, unspecified: Secondary | ICD-10-CM

## 2022-07-12 DIAGNOSIS — E6609 Other obesity due to excess calories: Secondary | ICD-10-CM | POA: Diagnosis not present

## 2022-07-12 LAB — POCT GLYCOSYLATED HEMOGLOBIN (HGB A1C): Hemoglobin A1C: 5.2 % (ref 4.0–5.6)

## 2022-07-12 MED ORDER — DEXCOM G6 RECEIVER DEVI: 0 refills | Status: DC

## 2022-07-12 MED ORDER — WEGOVY 1.7 MG/0.75ML ~~LOC~~ SOAJ: 1.7000 mg | SUBCUTANEOUS | 3 refills | Status: DC
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
## 2022-07-12 NOTE — Patient Instructions (Addendum)
Please decrease: - Wegovy 1.7 mg weekly  Please use the following insulin pump settings: - basal rates: 12 am: 0.75 units/h  - ICR: 1:10 - target: 110 - ISF: 30 - Active insulin time: 4 hours - bolus wizard: on  Please return in 4-6 months.

## 2022-07-16 ENCOUNTER — Encounter: Payer: Self-pay | Admitting: Internal Medicine

## 2022-07-28 ENCOUNTER — Ambulatory Visit: Payer: 59 | Admitting: Skilled Nursing Facility1

## 2022-08-17 ENCOUNTER — Encounter: Payer: Self-pay | Admitting: Internal Medicine

## 2022-08-17 ENCOUNTER — Other Ambulatory Visit: Payer: Self-pay | Admitting: Internal Medicine

## 2022-10-06 ENCOUNTER — Encounter: Payer: Self-pay | Admitting: Internal Medicine

## 2022-10-07 ENCOUNTER — Other Ambulatory Visit (HOSPITAL_COMMUNITY): Payer: Self-pay

## 2022-10-07 ENCOUNTER — Telehealth: Payer: Self-pay | Admitting: Pharmacy Technician

## 2022-10-07 NOTE — Telephone Encounter (Signed)
Pharmacy Patient Advocate Encounter   Received notification from Pt/CMA that prior authorization for Wegovy, Omnipod and Dexcom  is required/requested. Pt advice msg- pt ins changed and requires PA   Per test claim: Dexcom doesn't need PA.  PA submitted on 10/07/22 to (ins) [employer] [place] via [facility]), [id number] (omnipod) Status is pending

## 2022-10-12 NOTE — Telephone Encounter (Signed)
Patient Advocate Encounter  Received a fax from [place] regarding Prior Authorization for Wegovy 1.'75mg'$ /0.45m [place] SOAJ.   Authorization has been DENIED due to this medication will not be covered when used with insulin at the same time. In this case, the member is using this medication with insulin at the same time.  Determination letter attached to patient chart

## 2022-10-14 ENCOUNTER — Ambulatory Visit: Payer: Self-pay | Admitting: Podiatry

## 2022-10-14 ENCOUNTER — Other Ambulatory Visit (HOSPITAL_COMMUNITY): Payer: Self-pay

## 2022-10-14 NOTE — Telephone Encounter (Signed)
Pharmacy Patient Advocate Encounter  Prior Authorization for Omnipod has been approved.    Effective dates: 10/07/22 through 10/06/23  Already filled 10/12/22

## 2022-10-18 DIAGNOSIS — D631 Anemia in chronic kidney disease: Secondary | ICD-10-CM | POA: Diagnosis not present

## 2022-10-18 DIAGNOSIS — I129 Hypertensive chronic kidney disease with stage 1 through stage 4 chronic kidney disease, or unspecified chronic kidney disease: Secondary | ICD-10-CM | POA: Diagnosis not present

## 2022-10-18 DIAGNOSIS — N189 Chronic kidney disease, unspecified: Secondary | ICD-10-CM | POA: Diagnosis not present

## 2022-10-18 DIAGNOSIS — Z940 Kidney transplant status: Secondary | ICD-10-CM | POA: Diagnosis not present

## 2022-10-19 ENCOUNTER — Telehealth: Payer: Self-pay

## 2022-10-19 NOTE — Telephone Encounter (Signed)
Patient Advocate Encounter  Prior Authorization for Omnipod 5 G6 Pod (Gen 5)  has been approved through [place]: [id number]    Effective: 10-07-2022 to 10-06-2023

## 2022-10-26 ENCOUNTER — Other Ambulatory Visit (HOSPITAL_COMMUNITY): Payer: Self-pay

## 2022-10-28 ENCOUNTER — Other Ambulatory Visit (HOSPITAL_COMMUNITY): Payer: Self-pay

## 2022-11-01 ENCOUNTER — Other Ambulatory Visit: Payer: Self-pay | Admitting: Internal Medicine

## 2022-11-01 DIAGNOSIS — Z79899 Other long term (current) drug therapy: Secondary | ICD-10-CM | POA: Diagnosis not present

## 2022-11-01 DIAGNOSIS — Z940 Kidney transplant status: Secondary | ICD-10-CM | POA: Diagnosis not present

## 2022-11-01 DIAGNOSIS — I10 Essential (primary) hypertension: Secondary | ICD-10-CM | POA: Diagnosis not present

## 2022-11-01 DIAGNOSIS — D849 Immunodeficiency, unspecified: Secondary | ICD-10-CM | POA: Diagnosis not present

## 2022-11-02 ENCOUNTER — Encounter: Payer: Self-pay | Admitting: Internal Medicine

## 2022-11-02 ENCOUNTER — Other Ambulatory Visit: Payer: Self-pay

## 2022-11-02 MED ORDER — SEMAGLUTIDE (1 MG/DOSE) 4 MG/3ML ~~LOC~~ SOPN: 1.0000 mg | PEN_INJECTOR | SUBCUTANEOUS | 2 refills | Status: DC

## 2022-11-04 ENCOUNTER — Other Ambulatory Visit (HOSPITAL_COMMUNITY): Payer: Self-pay

## 2022-11-04 ENCOUNTER — Telehealth: Payer: Self-pay | Admitting: Pharmacy Technician

## 2022-11-04 NOTE — Telephone Encounter (Signed)
Pharmacy Patient Advocate Encounter   Received notification from Pt advice request that prior authorization for [facility] '5mg'$  is required/requested.   PA submitted on 11/04/22 to (ins) [employer] via [employer]  Prior Authorization has been approved.    Effective dates: 11/04/22 through 11/03/23  Msg RMA to send new RX.

## 2022-11-05 ENCOUNTER — Other Ambulatory Visit: Payer: Self-pay | Admitting: Internal Medicine

## 2022-11-05 MED ORDER — TIRZEPATIDE 5 MG/0.5ML ~~LOC~~ SOAJ: 5.0000 mg | SUBCUTANEOUS | 3 refills | Status: DC

## 2022-11-15 DIAGNOSIS — D2271 Melanocytic nevi of right lower limb, including hip: Secondary | ICD-10-CM | POA: Diagnosis not present

## 2022-11-15 DIAGNOSIS — L853 Xerosis cutis: Secondary | ICD-10-CM | POA: Diagnosis not present

## 2022-11-15 DIAGNOSIS — L668 Other cicatricial alopecia: Secondary | ICD-10-CM | POA: Diagnosis not present

## 2022-11-24 DIAGNOSIS — D485 Neoplasm of uncertain behavior of skin: Secondary | ICD-10-CM | POA: Diagnosis not present

## 2022-11-24 DIAGNOSIS — D2271 Melanocytic nevi of right lower limb, including hip: Secondary | ICD-10-CM | POA: Diagnosis not present

## 2022-11-26 ENCOUNTER — Encounter: Payer: Self-pay | Admitting: Internal Medicine

## 2022-12-08 DIAGNOSIS — D485 Neoplasm of uncertain behavior of skin: Secondary | ICD-10-CM | POA: Diagnosis not present
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
## 2022-12-16 ENCOUNTER — Ambulatory Visit: Payer: BC Managed Care – PPO | Admitting: Internal Medicine

## 2022-12-16 ENCOUNTER — Encounter: Payer: Self-pay | Admitting: Internal Medicine

## 2022-12-16 VITALS — BP 110/80 | HR 66 | Ht 64.0 in | Wt 122.8 lb

## 2022-12-16 DIAGNOSIS — E6609 Other obesity due to excess calories: Secondary | ICD-10-CM | POA: Diagnosis not present

## 2022-12-16 DIAGNOSIS — Z794 Long term (current) use of insulin: Secondary | ICD-10-CM | POA: Diagnosis not present

## 2022-12-16 DIAGNOSIS — Z6830 Body mass index (BMI) 30.0-30.9, adult: Secondary | ICD-10-CM

## 2022-12-16 DIAGNOSIS — N182 Chronic kidney disease, stage 2 (mild): Secondary | ICD-10-CM | POA: Diagnosis not present

## 2022-12-16 DIAGNOSIS — E785 Hyperlipidemia, unspecified: Secondary | ICD-10-CM

## 2022-12-16 DIAGNOSIS — E1122 Type 2 diabetes mellitus with diabetic chronic kidney disease: Secondary | ICD-10-CM | POA: Diagnosis not present

## 2022-12-16 DIAGNOSIS — E1169 Type 2 diabetes mellitus with other specified complication: Secondary | ICD-10-CM

## 2022-12-16 LAB — POCT GLYCOSYLATED HEMOGLOBIN (HGB A1C): Hemoglobin A1C: 5.2 % (ref 4.0–5.6)

## 2022-12-16 MED ORDER — TIRZEPATIDE 2.5 MG/0.5ML ~~LOC~~ SOAJ: 2.5000 mg | SUBCUTANEOUS | 3 refills | Status: DC

## 2022-12-16 NOTE — Patient Instructions (Addendum)
Please change: - Mounjaro 2.5 mg weekly  Please use the following insulin pump settings: - basal rates: 12 am: 0.75 >> 0.65 units/h  - ICR: 1:15  - target: 110  - ISF: 30 >> 40 - Active insulin time: 4 hours - bolus wizard: on  Please return in 4-6 months.

## 2022-12-27 DIAGNOSIS — J301 Allergic rhinitis due to pollen: Secondary | ICD-10-CM | POA: Diagnosis not present

## 2022-12-27 DIAGNOSIS — L501 Idiopathic urticaria: Secondary | ICD-10-CM | POA: Diagnosis not present

## 2022-12-27 DIAGNOSIS — R052 Subacute cough: Secondary | ICD-10-CM | POA: Diagnosis not present

## 2022-12-27 DIAGNOSIS — H1045 Other chronic allergic conjunctivitis: Secondary | ICD-10-CM | POA: Diagnosis not present

## 2023-01-03 ENCOUNTER — Ambulatory Visit (INDEPENDENT_AMBULATORY_CARE_PROVIDER_SITE_OTHER): Payer: BC Managed Care – PPO | Admitting: Internal Medicine

## 2023-01-03 ENCOUNTER — Encounter: Payer: Self-pay | Admitting: Internal Medicine

## 2023-01-03 VITALS — BP 124/78 | HR 70 | Temp 98.1°F | Ht 64.0 in | Wt 119.8 lb

## 2023-01-03 DIAGNOSIS — Z794 Long term (current) use of insulin: Secondary | ICD-10-CM

## 2023-01-03 DIAGNOSIS — E1165 Type 2 diabetes mellitus with hyperglycemia: Secondary | ICD-10-CM | POA: Diagnosis not present

## 2023-01-03 DIAGNOSIS — I129 Hypertensive chronic kidney disease with stage 1 through stage 4 chronic kidney disease, or unspecified chronic kidney disease: Secondary | ICD-10-CM

## 2023-01-03 DIAGNOSIS — R634 Abnormal weight loss: Secondary | ICD-10-CM

## 2023-01-03 DIAGNOSIS — N182 Chronic kidney disease, stage 2 (mild): Secondary | ICD-10-CM | POA: Diagnosis not present

## 2023-01-03 DIAGNOSIS — Z79899 Other long term (current) drug therapy: Secondary | ICD-10-CM | POA: Diagnosis not present

## 2023-01-03 DIAGNOSIS — D849 Immunodeficiency, unspecified: Secondary | ICD-10-CM

## 2023-01-03 DIAGNOSIS — E1122 Type 2 diabetes mellitus with diabetic chronic kidney disease: Secondary | ICD-10-CM | POA: Diagnosis not present

## 2023-01-03 DIAGNOSIS — Z0000 Encounter for general adult medical examination without abnormal findings: Secondary | ICD-10-CM

## 2023-01-03 DIAGNOSIS — Z6820 Body mass index (BMI) 20.0-20.9, adult: Secondary | ICD-10-CM

## 2023-01-03 NOTE — Patient Instructions (Addendum)
Simple Nutrition  Hypertension, Adult Hypertension is another name for high blood pressure. High blood pressure forces your heart to work harder to pump blood. This can cause problems over time. There are two numbers in a blood pressure reading. There is a top number (systolic) over a bottom number (diastolic). It is best to have a blood pressure that is below 120/80. What are the causes? The cause of this condition is not known. Some other conditions can lead to high blood pressure. What increases the risk? Some lifestyle factors can make you more likely to develop high blood pressure: Smoking. Not getting enough exercise or physical activity. Being overweight. Having too much fat, sugar, calories, or salt (sodium) in your diet. Drinking too much alcohol. Other risk factors include: Having any of these conditions: Heart disease. Diabetes. High cholesterol. Kidney disease. Obstructive sleep apnea. Having a family history of high blood pressure and high cholesterol. Age. The risk increases with age. Stress. What are the signs or symptoms? High blood pressure may not cause symptoms. Very high blood pressure (hypertensive crisis) may cause: Headache. Fast or uneven heartbeats (palpitations). Shortness of breath. Nosebleed. Vomiting or feeling like you may vomit (nauseous). Changes in how you see. Very bad chest pain. Feeling dizzy. Seizures. How is this treated? This condition is treated by making healthy lifestyle changes, such as: Eating healthy foods. Exercising more. Drinking less alcohol. Your doctor may prescribe medicine if lifestyle changes do not help enough and if: Your top number is above 130. Your bottom number is above 80. Your personal target blood pressure may vary. Follow these instructions at home: Eating and drinking  If told, follow the DASH eating plan. To follow this plan: Fill one half of your plate at each meal with fruits and vegetables. Fill one  fourth of your plate at each meal with whole grains. Whole grains include whole-wheat pasta, brown rice, and whole-grain bread. Eat or drink low-fat dairy products, such as skim milk or low-fat yogurt. Fill one fourth of your plate at each meal with low-fat (lean) proteins. Low-fat proteins include fish, chicken without skin, eggs, beans, and tofu. Avoid fatty meat, cured and processed meat, or chicken with skin. Avoid pre-made or processed food. Limit the amount of salt in your diet to less than 1,500 mg each day. Do not drink alcohol if: Your doctor tells you not to drink. You are pregnant, may be pregnant, or are planning to become pregnant. If you drink alcohol: Limit how much you have to: 0-1 drink a day for women. 0-2 drinks a day for men. Know how much alcohol is in your drink. In the [place] one drink equals one 12 oz bottle of beer (355 mL), one 5 oz glass of wine (148 mL), or one 1 oz glass of hard liquor (44 mL). Lifestyle  Work with your doctor to stay at a healthy weight or to lose weight. Ask your doctor what the best weight is for you. Get at least 30 minutes of exercise that causes your heart to beat faster (aerobic exercise) most days of the week. This may include walking, swimming, or biking. Get at least 30 minutes of exercise that strengthens your muscles (resistance exercise) at least 3 days a week. This may include lifting weights or doing Pilates. Do not smoke or use any products that contain nicotine or tobacco. If you need help quitting, ask your doctor. Check your blood pressure at home as told by your doctor. Keep all follow-up visits. Medicines Take over-the-counter  and prescription medicines only as told by your doctor. Follow directions carefully. Do not skip doses of blood pressure medicine. The medicine does not work as well if you skip doses. Skipping doses also puts you at risk for problems. Ask your doctor about side effects or reactions to medicines that  you should watch for. Contact a doctor if: You think you are having a reaction to the medicine you are taking. You have headaches that keep coming back. You feel dizzy. You have swelling in your ankles. You have trouble with your vision. Get help right away if: You get a very bad headache. You start to feel mixed up (confused). You feel weak or numb. You feel faint. You have very bad pain in your: Chest. Belly (abdomen). You vomit more than once. You have trouble breathing. These symptoms may be an emergency. Get help right away. Call 911. Do not wait to see if the symptoms will go away. Do not drive yourself to the hospital. Summary Hypertension is another name for high blood pressure. High blood pressure forces your heart to work harder to pump blood. For most people, a normal blood pressure is less than 120/80. Making healthy choices can help lower blood pressure. If your blood pressure does not get lower with healthy choices, you may need to take medicine. This information is not intended to replace advice given to you by your health care provider. Make sure you discuss any questions you have with your health care provider. Document Revised: 07/09/2021 Document Reviewed: 07/09/2021 Elsevier Patient Education  [place].

## 2023-01-03 NOTE — Progress Notes (Unsigned)
I,Victoria T Hamilton,acting as a scribe for Gwynneth Aliment, MD.,have documented all relevant documentation on the behalf of Gwynneth Aliment, MD,as directed by  Gwynneth Aliment, MD while in the presence of Gwynneth Aliment, MD.   Subjective:     Patient ID: [name] , female    DOB: [birth year] , 47 y.o.   MRN: [id number]   Chief Complaint  Patient presents with   Annual Exam   Hypertension   Diabetes    HPI  Patient presents today for a physical. She reports compliance with her medications. She is seen by Dr.Ross for her GYN care. She is also seen by Endocrinology for diabetes management. She does not have any questions or concerns.   Hypertension This is a chronic problem. The current episode started more than 1 year ago. The problem has been gradually improving since onset. The problem is controlled. Pertinent negatives include no blurred vision, chest pain, palpitations or shortness of breath.  Diabetes She presents for her follow-up diabetic visit. She has type 2 diabetes mellitus. Her disease course has been stable. There are no hypoglycemic associated symptoms. Pertinent negatives for diabetes include no blurred vision and no chest pain. There are no hypoglycemic complications. Diabetic complications include nephropathy. Risk factors for coronary artery disease include diabetes mellitus, hypertension, dyslipidemia, sedentary lifestyle and obesity.     Past Medical History:  Diagnosis Date   Arthritis    Diabetes mellitus    HTN (hypertension)    Hyperlipidemia    Pyelonephritis 09/30/11   Renal failure    S/P kidney transplant      Family History  Problem Relation Age of Onset   Diabetes Mother    Arthritis Mother    Arthritis Father    Prostate cancer Father    Hypertension Father    Breast cancer Maternal Aunt    Breast cancer Paternal Aunt      Current Outpatient Medications:    albuterol (VENTOLIN HFA) 108 (90 Base) MCG/ACT inhaler, Inhale 1-2 puffs  into the lungs every 4 (four) hours as needed for wheezing or shortness of breath., Disp: , Rfl:    amLODipine (NORVASC) 10 MG tablet, Take 10 mg by mouth every evening., Disp: , Rfl:    atorvastatin (LIPITOR) 10 MG tablet, Take 10 mg by mouth at bedtime., Disp: , Rfl:    azelastine (OPTIVAR) 0.05 % ophthalmic solution, Place 2 drops into both eyes 2 (two) times daily as needed (allergies)., Disp: , Rfl:    BAQSIMI ONE PACK 3 MG/DOSE POWD, PLACE 3 MG INTO THE NOSE ONCE AS NEEDED FOR UP TO 1 DOSE., Disp: 1 each, Rfl: 6   BD PEN NEEDLE NANO 2ND GEN 32G X 4 MM MISC, USE 3X A DAY, Disp: 300 each, Rfl: 3   Cholecalciferol (VITAMIN D3) 125 MCG (5000 UT) CAPS, Take 5,000 Units by mouth daily., Disp: , Rfl:    Continuous Blood Gluc Receiver (DEXCOM G6 RECEIVER) DEVI, Use as instructed to check blood sugar 4 times daily, Disp: 1 each, Rfl: 0   Continuous Blood Gluc Sensor (DEXCOM G6 SENSOR) MISC, Use as instructed change every 10 days, Disp: 9 each, Rfl: 3   diphenhydrAMINE (BENADRYL) 25 MG tablet, Take 50 mg by mouth every 6 (six) hours as needed for itching or allergies., Disp: , Rfl:    Insulin Aspart, w/Niacinamide, (FIASP) 100 UNIT/ML SOLN, Use up to 60 units per day in insulin pump, Disp: 50 mL, Rfl: 4   Insulin Disposable Pump (OMNIPOD  5 G6 POD, GEN 5,) MISC, APPLY EVERY 3 (THREE) DAYS., Disp: 30 each, Rfl: 3   levonorgestrel (MIRENA) 20 MCG/24HR IUD, by Intrauterine route., Disp: , Rfl:    LORazepam (ATIVAN) 1 MG tablet, Take 1 mg by mouth every 6 (six) hours as needed for anxiety., Disp: , Rfl:    mycophenolate (CELLCEPT) 250 MG capsule, Take 500-750 mg by mouth See admin instructions. 500 mg every morning  750 mg every evening, Disp: , Rfl:    predniSONE (DELTASONE) 5 MG tablet, Take 5 mg by mouth daily., Disp: , Rfl:    tacrolimus (PROGRAF) 0.5 MG capsule, Take 2.5-3 mg by mouth See admin instructions. 2.5 mg every morning  3 mg every evening, Disp: , Rfl:    tirzepatide (MOUNJARO) 2.5 MG/0.5ML  Pen, Inject 2.5 mg into the skin once a week., Disp: 6 mL, Rfl: 3   traZODone (DESYREL) 50 MG tablet, Take 50-100 mg by mouth at bedtime as needed for sleep., Disp: , Rfl:    trimethoprim (TRIMPEX) 100 MG tablet, Take 100 mg by mouth daily., Disp: , Rfl:    Continuous Blood Gluc Transmit (DEXCOM G6 TRANSMITTER) MISC, Use as instructed change every 90 days, Disp: 1 each, Rfl: 3   Allergies  Allergen Reactions   Adhesive [Tape] Other (See Comments)    "plastic tape" irritates skin   Ok with paper tape   Strawberry (Diagnostic)    Wound Dressing Adhesive     Other reaction(s): Other "plastic tape" irritates skin   Ok with paper tape      The patient states she uses IUD for birth control. Last LMP was No LMP recorded (exact date). (Menstrual status: IUD).. Negative for Dysmenorrhea. Negative for: breast discharge, breast lump(s), breast pain and breast self exam. Associated symptoms include abnormal vaginal bleeding. Pertinent negatives include abnormal bleeding (hematology), anxiety, decreased libido, depression, difficulty falling sleep, dyspareunia, history of infertility, nocturia, sexual dysfunction, sleep disturbances, urinary incontinence, urinary urgency, vaginal discharge and vaginal itching. Diet regular.    . The patient's tobacco use is:  Social History   Tobacco Use  Smoking Status Never  Smokeless Tobacco Never  . She has been exposed to passive smoke. The patient's alcohol use is:  Social History   Substance and Sexual Activity  Alcohol Use No   Review of Systems  Constitutional: Negative.   HENT: Negative.    Eyes: Negative.  Negative for blurred vision.  Respiratory: Negative.  Negative for shortness of breath.   Cardiovascular: Negative.  Negative for chest pain and palpitations.  Gastrointestinal: Negative.   Endocrine: Negative.   Genitourinary: Negative.   Musculoskeletal: Negative.   Skin: Negative.   Allergic/Immunologic: Negative.   Neurological: Negative.    Hematological: Negative.   Psychiatric/Behavioral: Negative.       Today's Vitals   01/03/23 1109  BP: 124/78  Pulse: 70  Temp: 98.1 F (36.7 C)  SpO2: 98%  Weight: 119 lb 12.8 oz (54.3 kg)  Height: 5\' 4"  (1.626 m)   Body mass index is 20.56 kg/m.  Wt Readings from Last 3 Encounters:  01/03/23 119 lb 12.8 oz (54.3 kg)  12/16/22 122 lb 12.8 oz (55.7 kg)  07/12/22 138 lb 3.2 oz (62.7 kg)    Objective:  Physical Exam Vitals and nursing note reviewed.  Constitutional:      Appearance: Normal appearance.  HENT:     Head: Normocephalic and atraumatic.     Right Ear: Tympanic membrane, ear canal and external ear normal.  Left Ear: Tympanic membrane, ear canal and external ear normal.     Nose:     Comments: Masked     Mouth/Throat:     Comments: Masked  Eyes:     Extraocular Movements: Extraocular movements intact.     Conjunctiva/sclera: Conjunctivae normal.     Pupils: Pupils are equal, round, and reactive to light.  Cardiovascular:     Rate and Rhythm: Normal rate and regular rhythm.     Pulses: Normal pulses.          Dorsalis pedis pulses are 2+ on the right side and 2+ on the left side.     Heart sounds: Normal heart sounds.  Pulmonary:     Effort: Pulmonary effort is normal.     Breath sounds: Normal breath sounds.  Chest:  Breasts:    Tanner Score is 5.     Right: Normal.     Left: Normal.  Abdominal:     General: Abdomen is flat. Bowel sounds are normal.     Palpations: Abdomen is soft.     Comments: Pump LLQ  Genitourinary:    Comments: deferred Musculoskeletal:        General: Normal range of motion.     Cervical back: Normal range of motion and neck supple.  Feet:     Right foot:     Protective Sensation: 5 sites tested.  5 sites sensed.     Skin integrity: Dry skin present.     Toenail Condition: Right toenails are normal.     Left foot:     Protective Sensation: 5 sites tested.  5 sites sensed.     Skin integrity: Dry skin present.      Toenail Condition: Left toenails are normal.  Skin:    General: Skin is warm and dry.     Comments: Scattered tattoos  Neurological:     General: No focal deficit present.     Mental Status: She is alert and oriented to person, place, and time.  Psychiatric:        Mood and Affect: Mood normal.        Behavior: Behavior normal.      Assessment And Plan:     1. Encounter for general adult medical examination w/o abnormal findings Comments: A full exam was performed. Importance of monthly self breast exams was discussed with the patient.  PATIENT IS ADVISED TO GET 30-45 MINUTES REGULAR EXERCISE NO LESS THAN FOUR TO FIVE DAYS PER WEEK - BOTH WEIGHTBEARING EXERCISES AND AEROBIC ARE RECOMMENDED.  PATIENT IS ADVISED TO FOLLOW A HEALTHY DIET WITH AT LEAST SIX FRUITS/VEGGIES PER DAY, DECREASE INTAKE OF RED MEAT, AND TO INCREASE FISH INTAKE TO TWO DAYS PER WEEK.  MEATS/FISH SHOULD NOT BE FRIED, BAKED OR BROILED IS PREFERABLE.  IT IS ALSO IMPORTANT TO CUT BACK ON YOUR SUGAR INTAKE. PLEASE AVOID ANYTHING WITH ADDED SUGAR, CORN SYRUP OR OTHER SWEETENERS. IF YOU MUST USE A SWEETENER, YOU CAN TRY STEVIA. IT IS ALSO IMPORTANT TO AVOID ARTIFICIALLY SWEETENERS AND DIET BEVERAGES. LASTLY, I SUGGEST WEARING SPF 50 SUNSCREEN ON EXPOSED PARTS AND ESPECIALLY WHEN IN THE DIRECT SUNLIGHT FOR AN EXTENDED PERIOD OF TIME.  PLEASE AVOID FAST FOOD RESTAURANTS AND INCREASE YOUR WATER INTAKE. - CMP14+EGFR - CBC - Lipid panel  2. Hypertensive nephropathy Comments: Chronic, well controlled. She will c/w amlodipine 10mg  daily. EKG performed, NSR w/o acute changes. Encouraged to follow low sodium diet. F/u 6 months. - POCT Urinalysis Dipstick (81002) - Microalbumin /  creatinine urine ratio - EKG 12-Lead  3. Type 2 diabetes mellitus with stage 2 chronic kidney disease, with long-term current use of insulin Comments: Chronic, diabetic foot exam was performed. Endocrinology input appreciated. Last a1c 5.04 December 2022. I will  defer insulin mgmt to Endo.  I DISCUSSED WITH THE PATIENT AT LENGTH REGARDING THE GOALS OF GLYCEMIC CONTROL AND POSSIBLE LONG-TERM COMPLICATIONS.  I  ALSO STRESSED THE IMPORTANCE OF COMPLIANCE WITH HOME GLUCOSE MONITORING, DIETARY RESTRICTIONS INCLUDING AVOIDANCE OF SUGARY DRINKS/PROCESSED FOODS,  ALONG WITH REGULAR EXERCISE.  I  ALSO STRESSED THE IMPORTANCE OF ANNUAL EYE EXAMS, SELF FOOT CARE AND COMPLIANCE WITH OFFICE VISITS.  - POCT Urinalysis Dipstick (81002) - Microalbumin / creatinine urine ratio - EKG 12-Lead  4. Weight loss, unintentional Comments: She has lost 19 lbs since October 2023. Her BMI is now 20. I will check prealbumin levels. She is encouraged to optimize her protein intake. - Prealbumin  5. Immunosuppression Comments: She is on prednisone, Cellsept and tacrolimus.  She is s/p renal transplant.  6. BMI 20.0-20.9, adult Comments: She is encouraged to aim for at least 150 minutes of exercise. Advised to increase caloric intake, reminded terzapetide can be linked to muscle wasting.  Patient was given opportunity to ask questions. Patient verbalized understanding of the plan and was able to repeat key elements of the plan. All questions were answered to their satisfaction.   I, Gwynneth Aliment, MD, have reviewed all documentation for this visit. The documentation on 01/08/23 for the exam, diagnosis, procedures, and orders are all accurate and complete.  THE PATIENT IS ENCOURAGED TO PRACTICE SOCIAL DISTANCING DUE TO THE COVID-19 PANDEMIC.

## 2023-01-04 DIAGNOSIS — J301 Allergic rhinitis due to pollen: Secondary | ICD-10-CM | POA: Diagnosis not present

## 2023-01-04 DIAGNOSIS — I129 Hypertensive chronic kidney disease with stage 1 through stage 4 chronic kidney disease, or unspecified chronic kidney disease: Secondary | ICD-10-CM | POA: Diagnosis not present

## 2023-01-04 DIAGNOSIS — N182 Chronic kidney disease, stage 2 (mild): Secondary | ICD-10-CM | POA: Diagnosis not present

## 2023-01-04 DIAGNOSIS — Z794 Long term (current) use of insulin: Secondary | ICD-10-CM | POA: Diagnosis not present

## 2023-01-04 DIAGNOSIS — E1122 Type 2 diabetes mellitus with diabetic chronic kidney disease: Secondary | ICD-10-CM | POA: Diagnosis not present

## 2023-01-04 LAB — CMP14+EGFR
ALT: 34 IU/L — ABNORMAL HIGH (ref 0–32)
AST: 22 IU/L (ref 0–40)
Albumin/Globulin Ratio: 2 (ref 1.2–2.2)
Albumin: 4.5 g/dL (ref 3.9–4.9)
Alkaline Phosphatase: 148 IU/L — ABNORMAL HIGH (ref 44–121)
BUN/Creatinine Ratio: 21 (ref 9–23)
BUN: 24 mg/dL (ref 6–24)
Bilirubin Total: 0.3 mg/dL (ref 0.0–1.2)
CO2: 17 mmol/L — ABNORMAL LOW (ref 20–29)
Calcium: 9.2 mg/dL (ref 8.7–10.2)
Chloride: 111 mmol/L — ABNORMAL HIGH (ref 96–106)
Creatinine, Ser: 1.17 mg/dL — ABNORMAL HIGH (ref 0.57–1.00)
Globulin, Total: 2.3 g/dL (ref 1.5–4.5)
Glucose: 99 mg/dL (ref 70–99)
Potassium: 4 mmol/L (ref 3.5–5.2)
Sodium: 141 mmol/L (ref 134–144)
Total Protein: 6.8 g/dL (ref 6.0–8.5)
eGFR: 58 mL/min/{1.73_m2} — ABNORMAL LOW (ref >59)

## 2023-01-04 LAB — LIPID PANEL
Chol/HDL Ratio: 2 ratio (ref 0.0–4.4)
Cholesterol, Total: 102 mg/dL (ref 100–199)
HDL: 52 mg/dL (ref >39)
LDL Chol Calc (NIH): 36 mg/dL (ref 0–99)
Triglycerides: 65 mg/dL (ref 0–149)
VLDL Cholesterol Cal: 14 mg/dL (ref 5–40)

## 2023-01-04 LAB — CBC
Hematocrit: 36.1 % (ref 34.0–46.6)
Hemoglobin: 11.2 g/dL (ref 11.1–15.9)
MCH: 28.3 pg (ref 26.6–33.0)
MCHC: 31 g/dL — ABNORMAL LOW (ref 31.5–35.7)
MCV: 91 fL (ref 79–97)
Platelets: 236 10*3/uL (ref 150–450)
RBC: 3.96 x10E6/uL (ref 3.77–5.28)
RDW: 13.1 % (ref 11.7–15.4)
WBC: 6.1 10*3/uL (ref 3.4–10.8)

## 2023-01-04 LAB — PREALBUMIN: PREALBUMIN: 26 mg/dL (ref 12–34)

## 2023-01-05 ENCOUNTER — Encounter: Payer: Self-pay | Admitting: Internal Medicine

## 2023-01-05 DIAGNOSIS — J3089 Other allergic rhinitis: Secondary | ICD-10-CM | POA: Diagnosis not present

## 2023-01-05 LAB — MICROALBUMIN / CREATININE URINE RATIO
Creatinine, Urine: 57.3 mg/dL
Microalb/Creat Ratio: 95 mg/g creat — ABNORMAL HIGH (ref 0–29)
Microalbumin, Urine: 54.4 ug/mL

## 2023-01-07 ENCOUNTER — Other Ambulatory Visit: Payer: Self-pay

## 2023-01-07 ENCOUNTER — Encounter: Payer: Self-pay | Admitting: Internal Medicine

## 2023-01-07 DIAGNOSIS — E1122 Type 2 diabetes mellitus with diabetic chronic kidney disease: Secondary | ICD-10-CM

## 2023-01-07 MED ORDER — DEXCOM G6 TRANSMITTER MISC
3 refills | Status: DC
Start: 2023-01-07 — End: 2023-04-15

## 2023-01-10 LAB — POCT URINALYSIS DIPSTICK
Bilirubin, UA: NEGATIVE
Glucose, UA: NEGATIVE
Ketones, UA: NEGATIVE
Leukocytes, UA: NEGATIVE
Nitrite, UA: NEGATIVE
Protein, UA: NEGATIVE
Spec Grav, UA: 1.02 (ref 1.010–1.025)
Urobilinogen, UA: 0.2 E.U./dL
pH, UA: 5.5 (ref 5.0–8.0)

## 2023-01-14 ENCOUNTER — Encounter: Payer: Self-pay | Admitting: Internal Medicine

## 2023-01-19 ENCOUNTER — Telehealth: Payer: Self-pay

## 2023-01-19 ENCOUNTER — Other Ambulatory Visit (HOSPITAL_COMMUNITY): Payer: Self-pay

## 2023-01-19 DIAGNOSIS — E1122 Type 2 diabetes mellitus with diabetic chronic kidney disease: Secondary | ICD-10-CM

## 2023-01-19 NOTE — Telephone Encounter (Signed)
PA has been submitted and will be updated in additional encounter created.  

## 2023-01-19 NOTE — Telephone Encounter (Signed)
PA request received via provider for [facility] 2.5MG /0.5ML pen-injectors  PA has been submitted to [facility] and is pending additional questions/determination  Key: [id number]  *patient on new dose

## 2023-01-20 ENCOUNTER — Other Ambulatory Visit (HOSPITAL_COMMUNITY): Payer: Self-pay

## 2023-01-20 DIAGNOSIS — D2271 Melanocytic nevi of right lower limb, including hip: Secondary | ICD-10-CM | POA: Diagnosis not present

## 2023-01-20 DIAGNOSIS — D485 Neoplasm of uncertain behavior of skin: Secondary | ICD-10-CM | POA: Diagnosis not present

## 2023-01-20 NOTE — Telephone Encounter (Signed)
Patient Advocate Encounter  Received a fax asking for additional information regarding prior authorization for Mounjaro 2.5MG /0.5ML pen-injectors.  Form filled out and submitted: 01-20-2023  to number provided on form [phone]

## 2023-01-21 DIAGNOSIS — Z713 Dietary counseling and surveillance: Secondary | ICD-10-CM | POA: Diagnosis not present

## 2023-01-24 ENCOUNTER — Other Ambulatory Visit: Payer: Self-pay | Admitting: Internal Medicine

## 2023-01-24 ENCOUNTER — Other Ambulatory Visit (HOSPITAL_COMMUNITY): Payer: Self-pay

## 2023-01-24 NOTE — Telephone Encounter (Signed)
I was able to find the denial letter. It is being denied due to quantity limitation. They only allow for 2 fills of this strength per 180 days. The expectation is that the dose will increase. See the details below, including what they are looking for to be considered medically necessary. I see the notes that speak to her weight-loss and reason for lowering her dose. What reasoning can I give to support her continuing to take the [facility] and the low dose, vs stopping it?

## 2023-01-25 NOTE — Telephone Encounter (Signed)
We can go back to the 5 mg dose but will need to take less insulin in that case.

## 2023-01-26 ENCOUNTER — Other Ambulatory Visit: Payer: Self-pay | Admitting: Internal Medicine

## 2023-01-26 DIAGNOSIS — E1122 Type 2 diabetes mellitus with diabetic chronic kidney disease: Secondary | ICD-10-CM

## 2023-01-26 MED ORDER — TIRZEPATIDE 5 MG/0.5ML ~~LOC~~ SOAJ
5.0000 mg | SUBCUTANEOUS | 3 refills | Status: DC
Start: 2023-01-26 — End: 2024-02-06
  Filled 2023-02-22: qty 2, 28d supply, fill #0
  Filled 2023-03-18: qty 2, 28d supply, fill #1
  Filled 2023-04-10: qty 2, 28d supply, fill #2
  Filled 2023-05-13 (×2): qty 2, 28d supply, fill #3
  Filled 2023-06-21: qty 2, 28d supply, fill #4
  Filled 2023-07-15 – 2023-07-26 (×2): qty 2, 28d supply, fill #5
  Filled 2023-08-29: qty 2, 28d supply, fill #6
  Filled 2023-10-10: qty 2, 28d supply, fill #7
  Filled 2023-10-31: qty 2, 28d supply, fill #8
  Filled 2023-12-01: qty 2, 28d supply, fill #9
  Filled 2023-12-22 – 2023-12-26 (×2): qty 2, 28d supply, fill #10

## 2023-01-26 NOTE — Telephone Encounter (Signed)
Pt advised of determination and rx for 5 mg dose sent to pharmacy.

## 2023-01-31 DIAGNOSIS — Z713 Dietary counseling and surveillance: Secondary | ICD-10-CM | POA: Diagnosis not present

## 2023-02-04 DIAGNOSIS — J3089 Other allergic rhinitis: Secondary | ICD-10-CM | POA: Diagnosis not present

## 2023-02-04 DIAGNOSIS — J301 Allergic rhinitis due to pollen: Secondary | ICD-10-CM | POA: Diagnosis not present

## 2023-02-04 DIAGNOSIS — J3081 Allergic rhinitis due to animal (cat) (dog) hair and dander: Secondary | ICD-10-CM | POA: Diagnosis not present

## 2023-02-07 DIAGNOSIS — J3081 Allergic rhinitis due to animal (cat) (dog) hair and dander: Secondary | ICD-10-CM | POA: Diagnosis not present

## 2023-02-07 DIAGNOSIS — J301 Allergic rhinitis due to pollen: Secondary | ICD-10-CM | POA: Diagnosis not present

## 2023-02-07 DIAGNOSIS — J3089 Other allergic rhinitis: Secondary | ICD-10-CM | POA: Diagnosis not present

## 2023-02-08 ENCOUNTER — Encounter: Payer: Self-pay | Admitting: Internal Medicine

## 2023-02-09 DIAGNOSIS — J3081 Allergic rhinitis due to animal (cat) (dog) hair and dander: Secondary | ICD-10-CM | POA: Diagnosis not present

## 2023-02-09 DIAGNOSIS — J301 Allergic rhinitis due to pollen: Secondary | ICD-10-CM | POA: Diagnosis not present

## 2023-02-09 DIAGNOSIS — J3089 Other allergic rhinitis: Secondary | ICD-10-CM | POA: Diagnosis not present

## 2023-02-11 DIAGNOSIS — Z713 Dietary counseling and surveillance: Secondary | ICD-10-CM | POA: Diagnosis not present

## 2023-02-11 DIAGNOSIS — J3089 Other allergic rhinitis: Secondary | ICD-10-CM | POA: Diagnosis not present

## 2023-02-11 DIAGNOSIS — J3081 Allergic rhinitis due to animal (cat) (dog) hair and dander: Secondary | ICD-10-CM | POA: Diagnosis not present

## 2023-02-11 DIAGNOSIS — J301 Allergic rhinitis due to pollen: Secondary | ICD-10-CM | POA: Diagnosis not present

## 2023-02-14 DIAGNOSIS — J3081 Allergic rhinitis due to animal (cat) (dog) hair and dander: Secondary | ICD-10-CM | POA: Diagnosis not present

## 2023-02-14 DIAGNOSIS — J301 Allergic rhinitis due to pollen: Secondary | ICD-10-CM | POA: Diagnosis not present

## 2023-02-14 DIAGNOSIS — J3089 Other allergic rhinitis: Secondary | ICD-10-CM | POA: Diagnosis not present

## 2023-02-16 DIAGNOSIS — J301 Allergic rhinitis due to pollen: Secondary | ICD-10-CM | POA: Diagnosis not present

## 2023-02-16 DIAGNOSIS — J3089 Other allergic rhinitis: Secondary | ICD-10-CM | POA: Diagnosis not present

## 2023-02-16 DIAGNOSIS — J3081 Allergic rhinitis due to animal (cat) (dog) hair and dander: Secondary | ICD-10-CM | POA: Diagnosis not present

## 2023-02-18 DIAGNOSIS — J301 Allergic rhinitis due to pollen: Secondary | ICD-10-CM | POA: Diagnosis not present

## 2023-02-18 DIAGNOSIS — J3081 Allergic rhinitis due to animal (cat) (dog) hair and dander: Secondary | ICD-10-CM | POA: Diagnosis not present

## 2023-02-18 DIAGNOSIS — J3089 Other allergic rhinitis: Secondary | ICD-10-CM | POA: Diagnosis not present

## 2023-02-21 ENCOUNTER — Other Ambulatory Visit: Payer: Self-pay | Admitting: Internal Medicine

## 2023-02-21 DIAGNOSIS — J3081 Allergic rhinitis due to animal (cat) (dog) hair and dander: Secondary | ICD-10-CM | POA: Diagnosis not present

## 2023-02-21 DIAGNOSIS — J3089 Other allergic rhinitis: Secondary | ICD-10-CM | POA: Diagnosis not present

## 2023-02-21 DIAGNOSIS — J301 Allergic rhinitis due to pollen: Secondary | ICD-10-CM | POA: Diagnosis not present

## 2023-02-21 MED ORDER — SEMAGLUTIDE (1 MG/DOSE) 4 MG/3ML ~~LOC~~ SOPN: 1.0000 mg | PEN_INJECTOR | SUBCUTANEOUS | 5 refills | Status: DC

## 2023-02-22 ENCOUNTER — Other Ambulatory Visit (HOSPITAL_COMMUNITY): Payer: Self-pay

## 2023-02-22 ENCOUNTER — Telehealth: Payer: Self-pay | Admitting: Pharmacy Technician

## 2023-02-22 NOTE — Telephone Encounter (Signed)
Pharmacy Patient Advocate Encounter  Received notification from RX request msgs/RMA that prior authorization for Ozempic is required/requested. Due to pt not being able to get her Greggory Keen from the pharmacy.   Per Test Claim: Ozempic does require pa, but we have Mounjaro available.    Called pt to verify if she was okay to pick it up at one of our pharmacies. [facility] is most convenient for her and she knew where it was. Called [facility] to give [facility] info to tx RX.  Relayed info to RMA to cancel Ozempic RX for now.

## 2023-02-23 DIAGNOSIS — J301 Allergic rhinitis due to pollen: Secondary | ICD-10-CM | POA: Diagnosis not present

## 2023-02-23 DIAGNOSIS — J3089 Other allergic rhinitis: Secondary | ICD-10-CM | POA: Diagnosis not present

## 2023-02-23 DIAGNOSIS — J3081 Allergic rhinitis due to animal (cat) (dog) hair and dander: Secondary | ICD-10-CM | POA: Diagnosis not present

## 2023-02-25 DIAGNOSIS — Z713 Dietary counseling and surveillance: Secondary | ICD-10-CM | POA: Diagnosis not present

## 2023-03-02 DIAGNOSIS — Z124 Encounter for screening for malignant neoplasm of cervix: Secondary | ICD-10-CM | POA: Diagnosis not present

## 2023-03-02 DIAGNOSIS — Z6820 Body mass index (BMI) 20.0-20.9, adult: Secondary | ICD-10-CM | POA: Diagnosis not present

## 2023-03-02 DIAGNOSIS — J301 Allergic rhinitis due to pollen: Secondary | ICD-10-CM | POA: Diagnosis not present

## 2023-03-02 DIAGNOSIS — J3081 Allergic rhinitis due to animal (cat) (dog) hair and dander: Secondary | ICD-10-CM | POA: Diagnosis not present

## 2023-03-02 DIAGNOSIS — J3089 Other allergic rhinitis: Secondary | ICD-10-CM | POA: Diagnosis not present

## 2023-03-02 DIAGNOSIS — Z01419 Encounter for gynecological examination (general) (routine) without abnormal findings: Secondary | ICD-10-CM | POA: Diagnosis not present

## 2023-03-07 DIAGNOSIS — J3089 Other allergic rhinitis: Secondary | ICD-10-CM | POA: Diagnosis not present

## 2023-03-07 DIAGNOSIS — J301 Allergic rhinitis due to pollen: Secondary | ICD-10-CM | POA: Diagnosis not present

## 2023-03-07 DIAGNOSIS — J3081 Allergic rhinitis due to animal (cat) (dog) hair and dander: Secondary | ICD-10-CM | POA: Diagnosis not present

## 2023-03-10 DIAGNOSIS — J3089 Other allergic rhinitis: Secondary | ICD-10-CM | POA: Diagnosis not present

## 2023-03-10 DIAGNOSIS — J3081 Allergic rhinitis due to animal (cat) (dog) hair and dander: Secondary | ICD-10-CM | POA: Diagnosis not present

## 2023-03-10 DIAGNOSIS — J301 Allergic rhinitis due to pollen: Secondary | ICD-10-CM | POA: Diagnosis not present

## 2023-03-11 DIAGNOSIS — Z713 Dietary counseling and surveillance: Secondary | ICD-10-CM | POA: Diagnosis not present

## 2023-03-14 DIAGNOSIS — J301 Allergic rhinitis due to pollen: Secondary | ICD-10-CM | POA: Diagnosis not present

## 2023-03-14 DIAGNOSIS — J3089 Other allergic rhinitis: Secondary | ICD-10-CM | POA: Diagnosis not present

## 2023-03-14 DIAGNOSIS — J3081 Allergic rhinitis due to animal (cat) (dog) hair and dander: Secondary | ICD-10-CM | POA: Diagnosis not present

## 2023-03-15 ENCOUNTER — Encounter: Payer: Self-pay | Admitting: Internal Medicine

## 2023-03-16 ENCOUNTER — Other Ambulatory Visit: Payer: Self-pay

## 2023-03-16 LAB — HM PAP SMEAR
HM Pap smear: POSITIVE
HPV 16/18/45 genotyping: NEGATIVE
HPV, high-risk: POSITIVE

## 2023-03-17 DIAGNOSIS — J3081 Allergic rhinitis due to animal (cat) (dog) hair and dander: Secondary | ICD-10-CM | POA: Diagnosis not present

## 2023-03-17 DIAGNOSIS — J301 Allergic rhinitis due to pollen: Secondary | ICD-10-CM | POA: Diagnosis not present

## 2023-03-17 DIAGNOSIS — J3089 Other allergic rhinitis: Secondary | ICD-10-CM | POA: Diagnosis not present

## 2023-03-18 ENCOUNTER — Other Ambulatory Visit (HOSPITAL_COMMUNITY): Payer: Self-pay

## 2023-03-21 DIAGNOSIS — J3081 Allergic rhinitis due to animal (cat) (dog) hair and dander: Secondary | ICD-10-CM | POA: Diagnosis not present

## 2023-03-21 DIAGNOSIS — J3089 Other allergic rhinitis: Secondary | ICD-10-CM | POA: Diagnosis not present

## 2023-03-21 DIAGNOSIS — J301 Allergic rhinitis due to pollen: Secondary | ICD-10-CM | POA: Diagnosis not present

## 2023-03-22 ENCOUNTER — Other Ambulatory Visit: Payer: Self-pay

## 2023-03-22 MED ORDER — FREESTYLE LIBRE 3 SENSOR MISC: 3 refills | Status: DC

## 2023-03-24 ENCOUNTER — Encounter: Payer: Self-pay | Admitting: Internal Medicine

## 2023-03-24 ENCOUNTER — Other Ambulatory Visit: Payer: Self-pay | Admitting: Internal Medicine

## 2023-03-24 DIAGNOSIS — J301 Allergic rhinitis due to pollen: Secondary | ICD-10-CM | POA: Diagnosis not present

## 2023-03-24 DIAGNOSIS — J3089 Other allergic rhinitis: Secondary | ICD-10-CM | POA: Diagnosis not present

## 2023-03-24 MED ORDER — BD PEN NEEDLE NANO 2ND GEN 32G X 4 MM MISC: 3 refills | Status: DC

## 2023-03-24 MED ORDER — FIASP FLEXTOUCH 100 UNIT/ML ~~LOC~~ SOPN: 5.0000 [IU] | PEN_INJECTOR | Freq: Three times a day (TID) | SUBCUTANEOUS | 3 refills | Status: DC

## 2023-03-24 MED ORDER — TRESIBA FLEXTOUCH 100 UNIT/ML ~~LOC~~ SOPN: 20.0000 [IU] | PEN_INJECTOR | Freq: Every day | SUBCUTANEOUS | 3 refills | Status: DC

## 2023-03-24 NOTE — Telephone Encounter (Signed)
Please advise on how much to send per pen. Also I do not see Evaristo Bury on her current medication list.

## 2023-04-01 DIAGNOSIS — J3089 Other allergic rhinitis: Secondary | ICD-10-CM | POA: Diagnosis not present

## 2023-04-01 DIAGNOSIS — J301 Allergic rhinitis due to pollen: Secondary | ICD-10-CM | POA: Diagnosis not present

## 2023-04-01 DIAGNOSIS — J3081 Allergic rhinitis due to animal (cat) (dog) hair and dander: Secondary | ICD-10-CM | POA: Diagnosis not present

## 2023-04-06 DIAGNOSIS — J3089 Other allergic rhinitis: Secondary | ICD-10-CM | POA: Diagnosis not present

## 2023-04-06 DIAGNOSIS — J301 Allergic rhinitis due to pollen: Secondary | ICD-10-CM | POA: Diagnosis not present

## 2023-04-06 DIAGNOSIS — J3081 Allergic rhinitis due to animal (cat) (dog) hair and dander: Secondary | ICD-10-CM | POA: Diagnosis not present

## 2023-04-11 ENCOUNTER — Other Ambulatory Visit (HOSPITAL_COMMUNITY): Payer: Self-pay

## 2023-04-12 DIAGNOSIS — Z940 Kidney transplant status: Secondary | ICD-10-CM | POA: Diagnosis not present

## 2023-04-12 DIAGNOSIS — Z79899 Other long term (current) drug therapy: Secondary | ICD-10-CM | POA: Diagnosis not present

## 2023-04-13 ENCOUNTER — Other Ambulatory Visit (HOSPITAL_COMMUNITY): Payer: Self-pay

## 2023-04-13 ENCOUNTER — Encounter: Payer: Self-pay | Admitting: Internal Medicine

## 2023-04-13 DIAGNOSIS — J3081 Allergic rhinitis due to animal (cat) (dog) hair and dander: Secondary | ICD-10-CM | POA: Diagnosis not present

## 2023-04-13 DIAGNOSIS — J301 Allergic rhinitis due to pollen: Secondary | ICD-10-CM | POA: Diagnosis not present

## 2023-04-13 DIAGNOSIS — J3089 Other allergic rhinitis: Secondary | ICD-10-CM | POA: Diagnosis not present

## 2023-04-14 ENCOUNTER — Encounter: Payer: Self-pay | Admitting: Internal Medicine
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
## 2023-04-15 ENCOUNTER — Other Ambulatory Visit (HOSPITAL_COMMUNITY): Payer: Self-pay

## 2023-04-15 ENCOUNTER — Encounter: Payer: Self-pay | Admitting: Internal Medicine

## 2023-04-15 ENCOUNTER — Ambulatory Visit: Payer: BC Managed Care – PPO | Admitting: Internal Medicine

## 2023-04-15 VITALS — BP 118/68 | HR 75 | Ht 64.0 in | Wt 118.8 lb

## 2023-04-15 DIAGNOSIS — Z7985 Long-term (current) use of injectable non-insulin antidiabetic drugs: Secondary | ICD-10-CM

## 2023-04-15 DIAGNOSIS — E1122 Type 2 diabetes mellitus with diabetic chronic kidney disease: Secondary | ICD-10-CM | POA: Diagnosis not present

## 2023-04-15 DIAGNOSIS — N182 Chronic kidney disease, stage 2 (mild): Secondary | ICD-10-CM | POA: Diagnosis not present

## 2023-04-15 DIAGNOSIS — E6609 Other obesity due to excess calories: Secondary | ICD-10-CM

## 2023-04-15 DIAGNOSIS — E1169 Type 2 diabetes mellitus with other specified complication: Secondary | ICD-10-CM | POA: Diagnosis not present

## 2023-04-15 DIAGNOSIS — E119 Type 2 diabetes mellitus without complications: Secondary | ICD-10-CM

## 2023-04-15 DIAGNOSIS — Z6830 Body mass index (BMI) 30.0-30.9, adult: Secondary | ICD-10-CM

## 2023-04-15 DIAGNOSIS — Z794 Long term (current) use of insulin: Secondary | ICD-10-CM

## 2023-04-15 DIAGNOSIS — E785 Hyperlipidemia, unspecified: Secondary | ICD-10-CM

## 2023-04-15 MED ORDER — TRESIBA FLEXTOUCH 100 UNIT/ML ~~LOC~~ SOPN: 14.0000 [IU] | PEN_INJECTOR | Freq: Every day | SUBCUTANEOUS | Status: DC

## 2023-04-15 NOTE — Patient Instructions (Addendum)
Please continue: - Mounjaro 5 mg every other week   Reduce: - Tresiba 14 units daily  Use: - FiAsp 1-2 units only with large meals  Please return in 4-6 months.

## 2023-04-20 DIAGNOSIS — J3081 Allergic rhinitis due to animal (cat) (dog) hair and dander: Secondary | ICD-10-CM | POA: Diagnosis not present

## 2023-04-20 DIAGNOSIS — J3089 Other allergic rhinitis: Secondary | ICD-10-CM | POA: Diagnosis not present

## 2023-04-20 DIAGNOSIS — Z940 Kidney transplant status: Secondary | ICD-10-CM | POA: Diagnosis not present

## 2023-04-20 DIAGNOSIS — N179 Acute kidney failure, unspecified: Secondary | ICD-10-CM | POA: Diagnosis not present

## 2023-04-20 DIAGNOSIS — N189 Chronic kidney disease, unspecified: Secondary | ICD-10-CM | POA: Diagnosis not present

## 2023-04-20 DIAGNOSIS — I129 Hypertensive chronic kidney disease with stage 1 through stage 4 chronic kidney disease, or unspecified chronic kidney disease: Secondary | ICD-10-CM | POA: Diagnosis not present

## 2023-04-20 DIAGNOSIS — J301 Allergic rhinitis due to pollen: Secondary | ICD-10-CM | POA: Diagnosis not present

## 2023-04-22 DIAGNOSIS — Z713 Dietary counseling and surveillance: Secondary | ICD-10-CM | POA: Diagnosis not present

## 2023-05-02 ENCOUNTER — Other Ambulatory Visit: Payer: Self-pay | Admitting: Internal Medicine

## 2023-05-02 DIAGNOSIS — Z1231 Encounter for screening mammogram for malignant neoplasm of breast: Secondary | ICD-10-CM

## 2023-05-06 DIAGNOSIS — Z713 Dietary counseling and surveillance: Secondary | ICD-10-CM | POA: Diagnosis not present

## 2023-05-06 DIAGNOSIS — J3081 Allergic rhinitis due to animal (cat) (dog) hair and dander: Secondary | ICD-10-CM | POA: Diagnosis not present

## 2023-05-06 DIAGNOSIS — J3089 Other allergic rhinitis: Secondary | ICD-10-CM | POA: Diagnosis not present

## 2023-05-06 DIAGNOSIS — J301 Allergic rhinitis due to pollen: Secondary | ICD-10-CM | POA: Diagnosis not present

## 2023-05-13 ENCOUNTER — Other Ambulatory Visit: Payer: Self-pay

## 2023-05-13 ENCOUNTER — Other Ambulatory Visit (HOSPITAL_COMMUNITY): Payer: Self-pay

## 2023-05-17 ENCOUNTER — Encounter: Payer: Self-pay | Admitting: Internal Medicine

## 2023-05-18 ENCOUNTER — Other Ambulatory Visit: Payer: Self-pay

## 2023-05-20 DIAGNOSIS — Z713 Dietary counseling and surveillance: Secondary | ICD-10-CM | POA: Diagnosis not present

## 2023-05-30 ENCOUNTER — Other Ambulatory Visit: Payer: Self-pay | Admitting: Internal Medicine

## 2023-05-30 DIAGNOSIS — K219 Gastro-esophageal reflux disease without esophagitis: Secondary | ICD-10-CM

## 2023-06-17 DIAGNOSIS — J301 Allergic rhinitis due to pollen: Secondary | ICD-10-CM | POA: Diagnosis not present

## 2023-06-17 DIAGNOSIS — J3089 Other allergic rhinitis: Secondary | ICD-10-CM | POA: Diagnosis not present

## 2023-06-17 DIAGNOSIS — Z713 Dietary counseling and surveillance: Secondary | ICD-10-CM | POA: Diagnosis not present

## 2023-06-20 ENCOUNTER — Ambulatory Visit
Admission: RE | Admit: 2023-06-20 | Discharge: 2023-06-20 | Disposition: A | Discharge status: Home / Self Care | Payer: BC Managed Care – PPO | Source: Ambulatory Visit | Attending: Internal Medicine | Admitting: Internal Medicine

## 2023-06-20 DIAGNOSIS — Z1231 Encounter for screening mammogram for malignant neoplasm of breast: Secondary | ICD-10-CM | POA: Diagnosis not present

## 2023-06-21 ENCOUNTER — Other Ambulatory Visit (HOSPITAL_COMMUNITY): Payer: Self-pay

## 2023-06-22 ENCOUNTER — Other Ambulatory Visit: Payer: Self-pay | Admitting: Internal Medicine

## 2023-06-22 DIAGNOSIS — R928 Other abnormal and inconclusive findings on diagnostic imaging of breast: Secondary | ICD-10-CM

## 2023-06-24 DIAGNOSIS — J3089 Other allergic rhinitis: Secondary | ICD-10-CM | POA: Diagnosis not present

## 2023-06-24 DIAGNOSIS — J3081 Allergic rhinitis due to animal (cat) (dog) hair and dander: Secondary | ICD-10-CM | POA: Diagnosis not present

## 2023-06-24 DIAGNOSIS — J301 Allergic rhinitis due to pollen: Secondary | ICD-10-CM | POA: Diagnosis not present

## 2023-06-27 DIAGNOSIS — R052 Subacute cough: Secondary | ICD-10-CM | POA: Diagnosis not present

## 2023-06-27 DIAGNOSIS — L501 Idiopathic urticaria: Secondary | ICD-10-CM | POA: Diagnosis not present

## 2023-06-27 DIAGNOSIS — H1045 Other chronic allergic conjunctivitis: Secondary | ICD-10-CM | POA: Diagnosis not present

## 2023-06-27 DIAGNOSIS — J301 Allergic rhinitis due to pollen: Secondary | ICD-10-CM | POA: Diagnosis not present

## 2023-06-28 ENCOUNTER — Ambulatory Visit: Payer: BC Managed Care – PPO

## 2023-06-28 ENCOUNTER — Ambulatory Visit
Admission: RE | Admit: 2023-06-28 | Discharge: 2023-06-28 | Disposition: A | Discharge status: Home / Self Care | Payer: BC Managed Care – PPO | Source: Ambulatory Visit | Attending: Internal Medicine | Admitting: Internal Medicine

## 2023-06-28 DIAGNOSIS — R928 Other abnormal and inconclusive findings on diagnostic imaging of breast: Secondary | ICD-10-CM

## 2023-07-01 DIAGNOSIS — J301 Allergic rhinitis due to pollen: Secondary | ICD-10-CM | POA: Diagnosis not present

## 2023-07-01 DIAGNOSIS — J3081 Allergic rhinitis due to animal (cat) (dog) hair and dander: Secondary | ICD-10-CM | POA: Diagnosis not present

## 2023-07-01 DIAGNOSIS — J3089 Other allergic rhinitis: Secondary | ICD-10-CM | POA: Diagnosis not present

## 2023-07-04 DIAGNOSIS — Z713 Dietary counseling and surveillance: Secondary | ICD-10-CM | POA: Diagnosis not present

## 2023-07-05 ENCOUNTER — Ambulatory Visit: Payer: BC Managed Care – PPO | Admitting: Internal Medicine

## 2023-07-08 DIAGNOSIS — J3081 Allergic rhinitis due to animal (cat) (dog) hair and dander: Secondary | ICD-10-CM | POA: Diagnosis not present

## 2023-07-08 DIAGNOSIS — J301 Allergic rhinitis due to pollen: Secondary | ICD-10-CM | POA: Diagnosis not present

## 2023-07-08 DIAGNOSIS — J3089 Other allergic rhinitis: Secondary | ICD-10-CM | POA: Diagnosis not present

## 2023-07-15 DIAGNOSIS — J301 Allergic rhinitis due to pollen: Secondary | ICD-10-CM | POA: Diagnosis not present

## 2023-07-15 DIAGNOSIS — J3081 Allergic rhinitis due to animal (cat) (dog) hair and dander: Secondary | ICD-10-CM | POA: Diagnosis not present

## 2023-07-15 DIAGNOSIS — Z940 Kidney transplant status: Secondary | ICD-10-CM | POA: Diagnosis not present

## 2023-07-15 DIAGNOSIS — Z79899 Other long term (current) drug therapy: Secondary | ICD-10-CM | POA: Diagnosis not present

## 2023-07-15 DIAGNOSIS — J3089 Other allergic rhinitis: Secondary | ICD-10-CM | POA: Diagnosis not present

## 2023-07-18 ENCOUNTER — Encounter: Payer: Self-pay | Admitting: Pharmacist

## 2023-07-18 ENCOUNTER — Other Ambulatory Visit: Payer: Self-pay

## 2023-07-21 ENCOUNTER — Other Ambulatory Visit: Payer: Self-pay

## 2023-07-22 DIAGNOSIS — J301 Allergic rhinitis due to pollen: Secondary | ICD-10-CM | POA: Diagnosis not present

## 2023-07-22 DIAGNOSIS — J3089 Other allergic rhinitis: Secondary | ICD-10-CM | POA: Diagnosis not present

## 2023-07-22 DIAGNOSIS — Z713 Dietary counseling and surveillance: Secondary | ICD-10-CM | POA: Diagnosis not present

## 2023-07-26 ENCOUNTER — Encounter: Payer: Self-pay | Admitting: Internal Medicine

## 2023-07-27 MED ORDER — FREESTYLE LIBRE 3 SENSOR MISC: 3 refills | Status: DC

## 2023-07-29 DIAGNOSIS — J301 Allergic rhinitis due to pollen: Secondary | ICD-10-CM | POA: Diagnosis not present

## 2023-07-29 DIAGNOSIS — J3081 Allergic rhinitis due to animal (cat) (dog) hair and dander: Secondary | ICD-10-CM | POA: Diagnosis not present

## 2023-07-29 DIAGNOSIS — J3089 Other allergic rhinitis: Secondary | ICD-10-CM | POA: Diagnosis not present

## 2023-08-02 DIAGNOSIS — J301 Allergic rhinitis due to pollen: Secondary | ICD-10-CM | POA: Diagnosis not present

## 2023-08-03 DIAGNOSIS — J3089 Other allergic rhinitis: Secondary | ICD-10-CM | POA: Diagnosis not present

## 2023-08-05 DIAGNOSIS — J3089 Other allergic rhinitis: Secondary | ICD-10-CM | POA: Diagnosis not present

## 2023-08-05 DIAGNOSIS — J301 Allergic rhinitis due to pollen: Secondary | ICD-10-CM | POA: Diagnosis not present

## 2023-08-05 DIAGNOSIS — J3081 Allergic rhinitis due to animal (cat) (dog) hair and dander: Secondary | ICD-10-CM | POA: Diagnosis not present

## 2023-08-12 DIAGNOSIS — J301 Allergic rhinitis due to pollen: Secondary | ICD-10-CM | POA: Diagnosis not present

## 2023-08-12 DIAGNOSIS — J3081 Allergic rhinitis due to animal (cat) (dog) hair and dander: Secondary | ICD-10-CM | POA: Diagnosis not present

## 2023-08-12 DIAGNOSIS — Z713 Dietary counseling and surveillance: Secondary | ICD-10-CM | POA: Diagnosis not present

## 2023-08-12 DIAGNOSIS — J3089 Other allergic rhinitis: Secondary | ICD-10-CM | POA: Diagnosis not present

## 2023-08-15 DIAGNOSIS — H25013 Cortical age-related cataract, bilateral: Secondary | ICD-10-CM | POA: Diagnosis not present

## 2023-08-15 DIAGNOSIS — N189 Chronic kidney disease, unspecified: Secondary | ICD-10-CM | POA: Diagnosis not present

## 2023-08-15 DIAGNOSIS — H524 Presbyopia: Secondary | ICD-10-CM | POA: Diagnosis not present

## 2023-08-15 DIAGNOSIS — E113213 Type 2 diabetes mellitus with mild nonproliferative diabetic retinopathy with macular edema, bilateral: Secondary | ICD-10-CM | POA: Diagnosis not present

## 2023-08-15 DIAGNOSIS — D631 Anemia in chronic kidney disease: Secondary | ICD-10-CM | POA: Diagnosis not present

## 2023-08-15 DIAGNOSIS — H52223 Regular astigmatism, bilateral: Secondary | ICD-10-CM | POA: Diagnosis not present

## 2023-08-15 DIAGNOSIS — I129 Hypertensive chronic kidney disease with stage 1 through stage 4 chronic kidney disease, or unspecified chronic kidney disease: Secondary | ICD-10-CM | POA: Diagnosis not present

## 2023-08-15 DIAGNOSIS — H2513 Age-related nuclear cataract, bilateral: Secondary | ICD-10-CM | POA: Diagnosis not present

## 2023-08-15 DIAGNOSIS — H5213 Myopia, bilateral: Secondary | ICD-10-CM | POA: Diagnosis not present

## 2023-08-15 DIAGNOSIS — Z940 Kidney transplant status: Secondary | ICD-10-CM | POA: Diagnosis not present

## 2023-08-15 LAB — HM DIABETES EYE EXAM

## 2023-08-19 DIAGNOSIS — J3089 Other allergic rhinitis: Secondary | ICD-10-CM | POA: Diagnosis not present

## 2023-08-19 DIAGNOSIS — Z23 Encounter for immunization: Secondary | ICD-10-CM | POA: Diagnosis not present

## 2023-08-19 DIAGNOSIS — J3081 Allergic rhinitis due to animal (cat) (dog) hair and dander: Secondary | ICD-10-CM | POA: Diagnosis not present

## 2023-08-19 DIAGNOSIS — J301 Allergic rhinitis due to pollen: Secondary | ICD-10-CM | POA: Diagnosis not present

## 2023-08-26 ENCOUNTER — Encounter: Payer: Self-pay | Admitting: Internal Medicine

## 2023-08-26 DIAGNOSIS — J3089 Other allergic rhinitis: Secondary | ICD-10-CM | POA: Diagnosis not present

## 2023-08-26 DIAGNOSIS — Z713 Dietary counseling and surveillance: Secondary | ICD-10-CM | POA: Diagnosis not present

## 2023-08-26 DIAGNOSIS — J3081 Allergic rhinitis due to animal (cat) (dog) hair and dander: Secondary | ICD-10-CM | POA: Diagnosis not present

## 2023-08-26 DIAGNOSIS — J301 Allergic rhinitis due to pollen: Secondary | ICD-10-CM | POA: Diagnosis not present

## 2023-09-06 ENCOUNTER — Encounter: Payer: Self-pay | Admitting: Internal Medicine

## 2023-09-07 ENCOUNTER — Other Ambulatory Visit: Payer: Self-pay | Admitting: Internal Medicine

## 2023-09-07 MED ORDER — FREESTYLE LIBRE 3 PLUS SENSOR MISC: 1.0000 | 3 refills | Status: DC

## 2023-09-07 NOTE — Addendum Note (Signed)
Addended by: Pollie Meyer on: 09/07/2023 03:35 PM   Modules accepted: Orders

## 2023-09-09 ENCOUNTER — Other Ambulatory Visit: Payer: Self-pay | Admitting: Internal Medicine

## 2023-09-12 ENCOUNTER — Telehealth: Payer: Self-pay

## 2023-09-12 ENCOUNTER — Other Ambulatory Visit (HOSPITAL_COMMUNITY): Payer: Self-pay

## 2023-09-12 NOTE — Telephone Encounter (Signed)
Pharmacy Patient Advocate Encounter   Received notification from Pt Calls Messages that prior authorization for Freestyle libre 3 plus is required/requested.   Insurance verification completed.   The patient is insured through [facility] .   Per test claim:  Dexcom G7 is preferred by the insurance.  If suggested medication is appropriate, Please send in a new RX and discontinue this one. If not, please advise as to why it's not appropriate so that we may request a Prior Authorization. Please note, some preferred medications may still require a PA   Test claim for Dexcom shows that it is successful with a 30 day copay of $90.80.

## 2023-09-12 NOTE — Telephone Encounter (Signed)
Pt needs a PA for [employer] 3 Plus

## 2023-09-15 DIAGNOSIS — J301 Allergic rhinitis due to pollen: Secondary | ICD-10-CM | POA: Diagnosis not present

## 2023-09-15 DIAGNOSIS — J3081 Allergic rhinitis due to animal (cat) (dog) hair and dander: Secondary | ICD-10-CM | POA: Diagnosis not present

## 2023-09-15 DIAGNOSIS — J3089 Other allergic rhinitis: Secondary | ICD-10-CM | POA: Diagnosis not present

## 2023-09-18 ENCOUNTER — Other Ambulatory Visit: Payer: Self-pay | Admitting: Internal Medicine

## 2023-09-20 ENCOUNTER — Other Ambulatory Visit (HOSPITAL_COMMUNITY): Payer: Self-pay

## 2023-09-21 ENCOUNTER — Other Ambulatory Visit: Payer: Self-pay

## 2023-09-21 MED ORDER — DEXCOM G7 SENSOR MISC: 3 refills | Status: DC

## 2023-09-22 DIAGNOSIS — J3089 Other allergic rhinitis: Secondary | ICD-10-CM | POA: Diagnosis not present

## 2023-09-22 DIAGNOSIS — J301 Allergic rhinitis due to pollen: Secondary | ICD-10-CM | POA: Diagnosis not present

## 2023-09-29 DIAGNOSIS — J3089 Other allergic rhinitis: Secondary | ICD-10-CM | POA: Diagnosis not present

## 2023-09-29 DIAGNOSIS — J3081 Allergic rhinitis due to animal (cat) (dog) hair and dander: Secondary | ICD-10-CM | POA: Diagnosis not present

## 2023-09-29 DIAGNOSIS — J301 Allergic rhinitis due to pollen: Secondary | ICD-10-CM | POA: Diagnosis not present

## 2023-10-06 ENCOUNTER — Other Ambulatory Visit: Payer: Self-pay

## 2023-10-06 DIAGNOSIS — E1122 Type 2 diabetes mellitus with diabetic chronic kidney disease: Secondary | ICD-10-CM

## 2023-10-06 MED ORDER — DEXCOM G7 SENSOR MISC: 3 refills | Status: AC

## 2023-10-11 ENCOUNTER — Other Ambulatory Visit: Payer: Self-pay

## 2023-10-11 DIAGNOSIS — N182 Chronic kidney disease, stage 2 (mild): Secondary | ICD-10-CM

## 2023-10-11 MED ORDER — FREESTYLE LIBRE 3 PLUS SENSOR MISC: 3 refills | Status: DC

## 2023-10-11 NOTE — Telephone Encounter (Signed)
 Freestyle Libre 3+ sensors refill request complete, patient made aware via mychart

## 2023-10-13 DIAGNOSIS — J301 Allergic rhinitis due to pollen: Secondary | ICD-10-CM | POA: Diagnosis not present

## 2023-10-13 DIAGNOSIS — J3089 Other allergic rhinitis: Secondary | ICD-10-CM | POA: Diagnosis not present

## 2023-10-14 DIAGNOSIS — Z713 Dietary counseling and surveillance: Secondary | ICD-10-CM | POA: Diagnosis not present

## 2023-10-17 ENCOUNTER — Ambulatory Visit: Payer: BC Managed Care – PPO | Admitting: Internal Medicine

## 2023-10-17 ENCOUNTER — Encounter: Payer: Self-pay | Admitting: Internal Medicine

## 2023-10-17 VITALS — BP 118/68 | HR 76 | Ht 64.0 in | Wt 114.8 lb

## 2023-10-17 DIAGNOSIS — E785 Hyperlipidemia, unspecified: Secondary | ICD-10-CM | POA: Diagnosis not present

## 2023-10-17 DIAGNOSIS — Z794 Long term (current) use of insulin: Secondary | ICD-10-CM

## 2023-10-17 DIAGNOSIS — E1169 Type 2 diabetes mellitus with other specified complication: Secondary | ICD-10-CM

## 2023-10-17 DIAGNOSIS — E1122 Type 2 diabetes mellitus with diabetic chronic kidney disease: Secondary | ICD-10-CM

## 2023-10-17 DIAGNOSIS — N182 Chronic kidney disease, stage 2 (mild): Secondary | ICD-10-CM | POA: Diagnosis not present

## 2023-10-17 LAB — POCT GLYCOSYLATED HEMOGLOBIN (HGB A1C): Hemoglobin A1C: 5.5 % (ref 4.0–5.6)

## 2023-10-17 MED ORDER — TRESIBA FLEXTOUCH 100 UNIT/ML ~~LOC~~ SOPN: 8.0000 [IU] | PEN_INJECTOR | Freq: Every day | SUBCUTANEOUS | Status: DC
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
## 2023-10-17 NOTE — Patient Instructions (Addendum)
 Please decrease Tresiba to 8 units daily.  Continue: - Mounjaro 5 mg every other week.  Please stop at the lab.  Please return in 4-6 months.

## 2023-10-20 DIAGNOSIS — J3081 Allergic rhinitis due to animal (cat) (dog) hair and dander: Secondary | ICD-10-CM | POA: Diagnosis not present

## 2023-10-20 DIAGNOSIS — J301 Allergic rhinitis due to pollen: Secondary | ICD-10-CM | POA: Diagnosis not present

## 2023-10-20 DIAGNOSIS — J3089 Other allergic rhinitis: Secondary | ICD-10-CM | POA: Diagnosis not present

## 2023-10-21 ENCOUNTER — Encounter: Payer: Self-pay | Admitting: Internal Medicine

## 2023-10-24 DIAGNOSIS — N2581 Secondary hyperparathyroidism of renal origin: Secondary | ICD-10-CM | POA: Diagnosis not present

## 2023-10-24 DIAGNOSIS — I129 Hypertensive chronic kidney disease with stage 1 through stage 4 chronic kidney disease, or unspecified chronic kidney disease: Secondary | ICD-10-CM | POA: Diagnosis not present

## 2023-10-24 DIAGNOSIS — Z940 Kidney transplant status: Secondary | ICD-10-CM | POA: Diagnosis not present

## 2023-10-24 DIAGNOSIS — N189 Chronic kidney disease, unspecified: Secondary | ICD-10-CM | POA: Diagnosis not present

## 2023-10-24 DIAGNOSIS — Z79899 Other long term (current) drug therapy: Secondary | ICD-10-CM | POA: Diagnosis not present

## 2023-10-25 LAB — C-PEPTIDE: C-Peptide: 1.66 ng/mL (ref 0.80–3.85)

## 2023-10-25 LAB — IA-2 ANTIBODY: IA-2 Antibody: <5.4 U/mL (ref <5.4)

## 2023-10-25 LAB — GLUTAMIC ACID DECARBOXYLASE AUTO ABS: Glutamic Acid Decarb Ab: <5 [IU]/mL (ref <5)

## 2023-10-25 LAB — ZNT8 ANTIBODIES: ZNT8 Antibodies: <10 U/mL (ref <15)

## 2023-10-25 LAB — GLUCOSE, FASTING: Glucose, Bld: 106 mg/dL — ABNORMAL HIGH (ref 65–99)

## 2023-10-27 DIAGNOSIS — J3089 Other allergic rhinitis: Secondary | ICD-10-CM | POA: Diagnosis not present

## 2023-10-27 DIAGNOSIS — J301 Allergic rhinitis due to pollen: Secondary | ICD-10-CM | POA: Diagnosis not present

## 2023-10-28 DIAGNOSIS — Z713 Dietary counseling and surveillance: Secondary | ICD-10-CM | POA: Diagnosis not present

## 2023-11-03 DIAGNOSIS — J3089 Other allergic rhinitis: Secondary | ICD-10-CM | POA: Diagnosis not present

## 2023-11-03 DIAGNOSIS — J301 Allergic rhinitis due to pollen: Secondary | ICD-10-CM | POA: Diagnosis not present

## 2023-11-03 DIAGNOSIS — J3081 Allergic rhinitis due to animal (cat) (dog) hair and dander: Secondary | ICD-10-CM | POA: Diagnosis not present

## 2023-11-10 DIAGNOSIS — J301 Allergic rhinitis due to pollen: Secondary | ICD-10-CM | POA: Diagnosis not present

## 2023-11-10 DIAGNOSIS — J3089 Other allergic rhinitis: Secondary | ICD-10-CM | POA: Diagnosis not present

## 2023-11-11 DIAGNOSIS — Z713 Dietary counseling and surveillance: Secondary | ICD-10-CM | POA: Diagnosis not present

## 2023-11-17 DIAGNOSIS — D225 Melanocytic nevi of trunk: Secondary | ICD-10-CM | POA: Diagnosis not present

## 2023-11-17 DIAGNOSIS — L821 Other seborrheic keratosis: Secondary | ICD-10-CM | POA: Diagnosis not present

## 2023-11-22 DIAGNOSIS — J301 Allergic rhinitis due to pollen: Secondary | ICD-10-CM | POA: Diagnosis not present

## 2023-11-22 DIAGNOSIS — J3081 Allergic rhinitis due to animal (cat) (dog) hair and dander: Secondary | ICD-10-CM | POA: Diagnosis not present

## 2023-11-22 DIAGNOSIS — J3089 Other allergic rhinitis: Secondary | ICD-10-CM | POA: Diagnosis not present

## 2023-11-28 DIAGNOSIS — J301 Allergic rhinitis due to pollen: Secondary | ICD-10-CM | POA: Diagnosis not present

## 2023-11-28 DIAGNOSIS — J3081 Allergic rhinitis due to animal (cat) (dog) hair and dander: Secondary | ICD-10-CM | POA: Diagnosis not present

## 2023-11-28 DIAGNOSIS — J3089 Other allergic rhinitis: Secondary | ICD-10-CM | POA: Diagnosis not present

## 2023-12-15 DIAGNOSIS — J3089 Other allergic rhinitis: Secondary | ICD-10-CM | POA: Diagnosis not present

## 2023-12-15 DIAGNOSIS — J301 Allergic rhinitis due to pollen: Secondary | ICD-10-CM | POA: Diagnosis not present

## 2023-12-15 DIAGNOSIS — J3081 Allergic rhinitis due to animal (cat) (dog) hair and dander: Secondary | ICD-10-CM | POA: Diagnosis not present

## 2023-12-22 ENCOUNTER — Other Ambulatory Visit (HOSPITAL_COMMUNITY): Payer: Self-pay

## 2023-12-26 ENCOUNTER — Other Ambulatory Visit (HOSPITAL_COMMUNITY): Payer: Self-pay

## 2023-12-26 DIAGNOSIS — J3089 Other allergic rhinitis: Secondary | ICD-10-CM | POA: Diagnosis not present

## 2023-12-26 DIAGNOSIS — L501 Idiopathic urticaria: Secondary | ICD-10-CM | POA: Diagnosis not present

## 2023-12-26 DIAGNOSIS — J301 Allergic rhinitis due to pollen: Secondary | ICD-10-CM | POA: Diagnosis not present

## 2023-12-26 DIAGNOSIS — R052 Subacute cough: Secondary | ICD-10-CM | POA: Diagnosis not present

## 2023-12-26 DIAGNOSIS — H1045 Other chronic allergic conjunctivitis: Secondary | ICD-10-CM | POA: Diagnosis not present

## 2023-12-26 DIAGNOSIS — J3081 Allergic rhinitis due to animal (cat) (dog) hair and dander: Secondary | ICD-10-CM | POA: Diagnosis not present

## 2024-01-04 DIAGNOSIS — J3089 Other allergic rhinitis: Secondary | ICD-10-CM | POA: Diagnosis not present

## 2024-01-04 DIAGNOSIS — J301 Allergic rhinitis due to pollen: Secondary | ICD-10-CM | POA: Diagnosis not present

## 2024-01-04 DIAGNOSIS — J3081 Allergic rhinitis due to animal (cat) (dog) hair and dander: Secondary | ICD-10-CM | POA: Diagnosis not present

## 2024-01-09 ENCOUNTER — Encounter: Payer: BC Managed Care – PPO | Admitting: Internal Medicine

## 2024-01-10 ENCOUNTER — Encounter: Payer: Self-pay | Admitting: Internal Medicine

## 2024-01-10 ENCOUNTER — Ambulatory Visit: Payer: BC Managed Care – PPO | Admitting: Internal Medicine

## 2024-01-10 VITALS — BP 118/82 | HR 70 | Temp 97.8°F | Ht 64.0 in | Wt 112.4 lb

## 2024-01-10 DIAGNOSIS — E1122 Type 2 diabetes mellitus with diabetic chronic kidney disease: Secondary | ICD-10-CM

## 2024-01-10 DIAGNOSIS — Z794 Long term (current) use of insulin: Secondary | ICD-10-CM

## 2024-01-10 DIAGNOSIS — I129 Hypertensive chronic kidney disease with stage 1 through stage 4 chronic kidney disease, or unspecified chronic kidney disease: Secondary | ICD-10-CM

## 2024-01-10 DIAGNOSIS — E785 Hyperlipidemia, unspecified: Secondary | ICD-10-CM | POA: Diagnosis not present

## 2024-01-10 DIAGNOSIS — D849 Immunodeficiency, unspecified: Secondary | ICD-10-CM

## 2024-01-10 DIAGNOSIS — Z940 Kidney transplant status: Secondary | ICD-10-CM

## 2024-01-10 DIAGNOSIS — Z681 Body mass index (BMI) 19 or less, adult: Secondary | ICD-10-CM

## 2024-01-10 DIAGNOSIS — Z0000 Encounter for general adult medical examination without abnormal findings: Secondary | ICD-10-CM | POA: Diagnosis not present

## 2024-01-10 DIAGNOSIS — Z79891 Long term (current) use of opiate analgesic: Secondary | ICD-10-CM | POA: Diagnosis not present

## 2024-01-10 DIAGNOSIS — N182 Chronic kidney disease, stage 2 (mild): Secondary | ICD-10-CM | POA: Diagnosis not present

## 2024-01-10 DIAGNOSIS — R634 Abnormal weight loss: Secondary | ICD-10-CM | POA: Diagnosis not present

## 2024-01-10 NOTE — Progress Notes (Signed)
 Penney Bowling, CMA,acting as a Neurosurgeon for Smiley Dung, MD.,have documented all relevant documentation on the behalf of Smiley Dung, MD,as directed by  Smiley Dung, MD while in the presence of Smiley Dung, MD.  Subjective:    Patient ID: [name] , female    DOB: 1976/03/18 , 48 y.o.   MRN: [id number]  Chief Complaint  Patient presents with   Annual Exam    Patient presents today for annual exam. She reports compliance with medications. Denies headache, chest pain & sob. GYN: Dr Avanell Bob. Letter sent to GYN for most recent pap.     Hypertension   Diabetes    HPI Discussed the use of AI scribe software for clinical note transcription with the patient, who gave verbal consent to proceed.  History of Present Illness [name] is a 48 year old female with diabetes and a history of kidney transplant who presents for medication management and nutritional concerns.  She is managing her diabetes with Horace Lye, having recently reduced the dosage to eight units per day. She is not using Fiasp, the short-acting insulin. For glucose monitoring, she uses the [employer] due to insurance issues with Dexcom, and she prefers the [place] 3 Plus. Her A1c in January was 5.5, indicating good glycemic control.  She is working with a nutritionist to address her low BMI of 19 and recent weight loss of six pounds since July. She is consuming protein shakes, specifically Premier and Breakfast Essentials, to increase her protein intake. She notes a discrepancy in her weight measurements between home and the clinic, believing she weighs more than recorded at the clinic.  Her medication regimen includes amlodipine 10 mg for blood pressure, Baqsimi for potential hypoglycemia, Cellcept, prednisone, Prograf, and Mounjaro 5 mg every other week. She is not currently on atorvastatin. She also uses albuterol as needed, although she has not required it since starting allergy shots.  Her last  visit to the nephrologist was in December or early January, with a follow-up scheduled for May 7th. She mentions being released from regular follow-ups due to being far out from her kidney transplant.   Hypertension This is a chronic problem. The current episode started more than 1 year ago. The problem has been gradually improving since onset. The problem is controlled. Pertinent negatives include no blurred vision, chest pain, palpitations or shortness of breath.  Diabetes She presents for her follow-up diabetic visit. She has type 2 diabetes mellitus. Her disease course has been stable. There are no hypoglycemic associated symptoms. Pertinent negatives for diabetes include no blurred vision and no chest pain. There are no hypoglycemic complications. Diabetic complications include nephropathy. Risk factors for coronary artery disease include diabetes mellitus, hypertension, dyslipidemia, sedentary lifestyle and obesity.     Past Medical History:  Diagnosis Date   Arthritis    Diabetes mellitus    HTN (hypertension)    Hyperlipidemia    Pyelonephritis 09/30/11   Renal failure    S/P kidney transplant      Family History  Problem Relation Age of Onset   Diabetes Mother    Arthritis Mother    Arthritis Father    Prostate cancer Father    Hypertension Father    Breast cancer Maternal Aunt    Breast cancer Paternal Aunt      Current Outpatient Medications:    albuterol (VENTOLIN HFA) 108 (90 Base) MCG/ACT inhaler, Inhale 1-2 puffs into the lungs every 4 (four) hours as needed for  wheezing or shortness of breath., Disp: , Rfl:    amLODipine (NORVASC) 10 MG tablet, Take 10 mg by mouth every evening., Disp: , Rfl:    azelastine (OPTIVAR) 0.05 % ophthalmic solution, Place 2 drops into both eyes 2 (two) times daily as needed (allergies)., Disp: , Rfl:    BAQSIMI ONE PACK 3 MG/DOSE POWD, PLACE 3 MG INTO THE NOSE ONCE AS NEEDED FOR UP TO 1 DOSE., Disp: 1 each, Rfl: 6   Cholecalciferol  (VITAMIN D3) 125 MCG (5000 UT) CAPS, Take 5,000 Units by mouth daily., Disp: , Rfl:    Continuous Glucose Sensor (DEXCOM G7 SENSOR) MISC, Change sensor every 10 days, Disp: 9 each, Rfl: 3   Continuous Glucose Sensor (DEXCOM G7 SENSOR) MISC, Use to check glucose continuously, change sensor every 10 days, Disp: 9 each, Rfl: 3   Continuous Glucose Sensor (FREESTYLE LIBRE 3 PLUS SENSOR) MISC, INJECT 1 DEVICE INTO THE SKIN CONTINUOUS. CHANGE EVERY 15 DAYS, Disp: 6 each, Rfl: 3   diphenhydrAMINE (BENADRYL) 25 MG tablet, Take 50 mg by mouth every 6 (six) hours as needed for itching or allergies., Disp: , Rfl:    insulin degludec (TRESIBA FLEXTOUCH) 100 UNIT/ML FlexTouch Pen, Inject 8 Units into the skin daily., Disp: , Rfl:    Insulin Pen Needle (BD PEN NEEDLE NANO 2ND GEN) 32G X 4 MM MISC, USE 3X A DAY, Disp: 300 each, Rfl: 3   levonorgestrel (MIRENA) 20 MCG/24HR IUD, by Intrauterine route., Disp: , Rfl:    LORazepam (ATIVAN) 1 MG tablet, Take 1 mg by mouth every 6 (six) hours as needed for anxiety., Disp: , Rfl:    mycophenolate (CELLCEPT) 250 MG capsule, Take 500-750 mg by mouth See admin instructions. 500 mg every morning  750 mg every evening, Disp: , Rfl:    predniSONE (DELTASONE) 5 MG tablet, Take 5 mg by mouth daily., Disp: , Rfl:    tacrolimus (PROGRAF) 0.5 MG capsule, Take 2.5-3 mg by mouth See admin instructions. 2.5 mg every morning  3 mg every evening, Disp: , Rfl:    tirzepatide (MOUNJARO) 5 MG/0.5ML Pen, Inject 5 mg into the skin once a week., Disp: 6 mL, Rfl: 3   traZODone (DESYREL) 50 MG tablet, Take 50-100 mg by mouth at bedtime as needed for sleep., Disp: , Rfl:    trimethoprim (TRIMPEX) 100 MG tablet, Take 100 mg by mouth daily., Disp: , Rfl:    Allergies  Allergen Reactions   Adhesive [Tape] Other (See Comments)    "plastic tape" irritates skin   Ok with paper tape   Strawberry (Diagnostic)    Wound Dressing Adhesive     Other reaction(s): Other "plastic tape" irritates skin   Ok  with paper tape      The patient states she uses IUD for birth control. No LMP recorded. (Menstrual status: IUD).. Negative for Dysmenorrhea. Negative for: breast discharge, breast lump(s), breast pain and breast self exam. Associated symptoms include abnormal vaginal bleeding. Pertinent negatives include abnormal bleeding (hematology), anxiety, decreased libido, depression, difficulty falling sleep, dyspareunia, history of infertility, nocturia, sexual dysfunction, sleep disturbances, urinary incontinence, urinary urgency, vaginal discharge and vaginal itching. Diet regular.The patient states her exercise level is  intermittent.  . The patient's tobacco use is:  Social History   Tobacco Use  Smoking Status Never  Smokeless Tobacco Never  . She has been exposed to passive smoke. The patient's alcohol use is:  Social History   Substance and Sexual Activity  Alcohol Use No  Review of Systems  Constitutional: Negative.   HENT: Negative.    Eyes: Negative.  Negative for blurred vision.  Respiratory: Negative.  Negative for shortness of breath.   Cardiovascular: Negative.  Negative for chest pain and palpitations.  Gastrointestinal: Negative.   Endocrine: Negative.   Genitourinary: Negative.   Musculoskeletal: Negative.   Skin: Negative.   Allergic/Immunologic: Negative.   Neurological: Negative.   Hematological: Negative.   Psychiatric/Behavioral: Negative.       Today's Vitals   01/10/24 0840  BP: 118/82  Pulse: 70  Temp: 97.8 F (36.6 C)  SpO2: 98%  Weight: 112 lb 6.4 oz (51 kg)  Height: 5\' 4"  (1.626 m)   Body mass index is 19.29 kg/m.  Wt Readings from Last 3 Encounters:  01/10/24 112 lb 6.4 oz (51 kg)  10/17/23 114 lb 12.8 oz (52.1 kg)  04/15/23 118 lb 12.8 oz (53.9 kg)    Wt Readings from Last 3 Encounters:  01/10/24 112 lb 6.4 oz (51 kg)  10/17/23 114 lb 12.8 oz (52.1 kg)  04/15/23 118 lb 12.8 oz (53.9 kg)     Objective:  Physical Exam Vitals and  nursing note reviewed.  Constitutional:      Comments: She is quite thin  HENT:     Head: Normocephalic and atraumatic.     Right Ear: Tympanic membrane, ear canal and external ear normal.     Left Ear: Tympanic membrane, ear canal and external ear normal.  Eyes:     Extraocular Movements: Extraocular movements intact.     Conjunctiva/sclera: Conjunctivae normal.     Pupils: Pupils are equal, round, and reactive to light.  Cardiovascular:     Rate and Rhythm: Normal rate and regular rhythm.     Pulses: Normal pulses.          Dorsalis pedis pulses are 2+ on the right side and 2+ on the left side.     Heart sounds: Normal heart sounds.  Pulmonary:     Effort: Pulmonary effort is normal.     Breath sounds: Normal breath sounds.  Chest:  Breasts:    Right: Normal.     Left: Normal.  Abdominal:     General: Abdomen is flat. Bowel sounds are normal.     Palpations: Abdomen is soft.     Comments: Pump LLQ  Genitourinary:    Comments: deferred Musculoskeletal:        General: Normal range of motion.     Cervical back: Normal range of motion and neck supple.  Feet:     Right foot:     Protective Sensation: 5 sites tested.  5 sites sensed.     Skin integrity: Dry skin present.     Toenail Condition: Right toenails are normal.     Left foot:     Protective Sensation: 5 sites tested.  5 sites sensed.     Skin integrity: Dry skin present.     Toenail Condition: Left toenails are normal.  Skin:    General: Skin is warm and dry.     Comments: Scattered tattoos on anterior chest, abdomen, b/l UE  Neurological:     General: No focal deficit present.     Mental Status: She is alert and oriented to person, place, and time. Mental status is at baseline.  Psychiatric:        Mood and Affect: Mood normal.        Behavior: Behavior normal.  Assessment And Plan:     Encounter for general adult medical examination w/o abnormal findings Assessment & Plan: A full exam was  performed.  Importance of monthly self breast exams was discussed with the patient.  She is advised to get 30-45 minutes of regular exercise, no less than four to five days per week. Both weight-bearing and aerobic exercises are recommended.  She is advised to follow a healthy diet with at least six fruits/veggies per day, decrease intake of red meat and other saturated fats and to increase fish intake to twice weekly.  Meats/fish should not be fried -- baked, boiled or broiled is preferable. It is also important to cut back on your sugar intake.  Be sure to read labels - try to avoid anything with added sugar, high fructose corn syrup or other sweeteners.  If you must use a sweetener, you can try stevia or monkfruit.  It is also important to avoid artificially sweetened foods/beverages and diet drinks. Lastly, wear SPF 50 sunscreen on exposed skin and when in direct sunlight for an extended period of time.  Be sure to avoid fast food restaurants and aim for at least 60 ounces of water daily.      Orders: -     CBC -     Lipid panel -     TSH  Hypertensive nephropathy Assessment & Plan: Chronic, controlled. EKG performed, NSR w/ . She will continue with amlodipine 10mg  daily. She is encouraged to follow low sodium diet. She will continue to follow up with Renal.   Orders: -     EKG 12-Lead -     Renal function panel -     Microalbumin / creatinine urine ratio  Type 2 diabetes mellitus with stage 2 chronic kidney disease, with long-term current use of insulin (HCC) Assessment & Plan: Chronic, diabetic foot exam was performed.  Diabetes well-controlled with [place] and [employer]. A1c was 5.5 in January. Prefers Libre 3 Plus. - Continue Horace Lye as prescribed. - Consider changing dosing for Mounjaro to every 3 week dosing due to continued weight loss. Encouraged to consider adding more protein/calories to her current diet. It appears she has had some muscle atrophy due to inadequate po intake.   - She is also followed by Endo  - Continue using Freestyle Libre for glucose monitoring. - Ensure Baqsimi is available for hypoglycemia management.  Orders: -     Renal function panel -     Microalbumin / creatinine urine ratio  Weight loss, unintentional -     Prealbumin  Body mass index (BMI) 19.9 or less, adult Assessment & Plan: BMI 19 with 6-pound weight loss. Insufficient protein intake may lead to muscle atrophy. Considering collagen addition. - Check prealbumin level to assess protein needs. - Continue working with a nutritionist. - Consider adding collagen to protein shakes for additional protein.   Immunosuppression (HCC) Assessment & Plan: She is maintained on triple immunosuppression s/p kidney transplant. She will continue with prednisone, Cellcept and Tacrolimus.     Kidney transplant status Assessment & Plan: She had kidney transplant June 2011.   On Cellcept, prednisone, and Prograf. Released from regular follow-ups. Upcoming nephrology appointment on May 7th. - Fax lab results to Dr. Rennie Casco at [facility]. - Attend follow-up appointment with nephrologist on May 7th.     Return for 1 year physical, 6 month bp. Patient was given opportunity to ask questions. Patient verbalized understanding of the plan and was able to repeat key elements of  the plan. All questions were answered to their satisfaction.     I, Smiley Dung, MD, have reviewed all documentation for this visit. The documentation on 01/10/24 for the exam, diagnosis, procedures, and orders are all accurate and complete.

## 2024-01-10 NOTE — Assessment & Plan Note (Signed)

## 2024-01-10 NOTE — Assessment & Plan Note (Signed)
 She is maintained on triple immunosuppression s/p kidney transplant. She will continue with prednisone, Cellcept and Tacrolimus.

## 2024-01-10 NOTE — Assessment & Plan Note (Signed)
 Chronic, controlled. EKG performed, NSR w/ . She will continue with amlodipine 10mg  daily. She is encouraged to follow low sodium diet. She will continue to follow up with Renal.

## 2024-01-10 NOTE — Assessment & Plan Note (Signed)
 BMI 19 with 6-pound weight loss. Insufficient protein intake may lead to muscle atrophy. Considering collagen addition. - Check prealbumin level to assess protein needs. - Continue working with a nutritionist. - Consider adding collagen to protein shakes for additional protein.

## 2024-01-10 NOTE — Patient Instructions (Signed)

## 2024-01-11 LAB — MICROALBUMIN / CREATININE URINE RATIO
Creatinine, Urine: 149.5 mg/dL
Microalb/Creat Ratio: 591 mg/g{creat} — ABNORMAL HIGH (ref 0–29)
Microalbumin, Urine: 882.8 ug/mL

## 2024-01-11 LAB — RENAL FUNCTION PANEL
Albumin: 4 g/dL (ref 3.9–4.9)
BUN/Creatinine Ratio: 18 (ref 9–23)
BUN: 23 mg/dL (ref 6–24)
CO2: 18 mmol/L — ABNORMAL LOW (ref 20–29)
Calcium: 8.7 mg/dL (ref 8.7–10.2)
Chloride: 114 mmol/L — ABNORMAL HIGH (ref 96–106)
Creatinine, Ser: 1.25 mg/dL — ABNORMAL HIGH (ref 0.57–1.00)
Glucose: 99 mg/dL (ref 70–99)
Phosphorus: 3.1 mg/dL (ref 3.0–4.3)
Potassium: 4.5 mmol/L (ref 3.5–5.2)
Sodium: 146 mmol/L — ABNORMAL HIGH (ref 134–144)
eGFR: 53 mL/min/{1.73_m2} — ABNORMAL LOW (ref >59)

## 2024-01-11 LAB — LIPID PANEL
Chol/HDL Ratio: 2.3 ratio (ref 0.0–4.4)
Cholesterol, Total: 116 mg/dL (ref 100–199)
HDL: 50 mg/dL (ref >39)
LDL Chol Calc (NIH): 51 mg/dL (ref 0–99)
Triglycerides: 73 mg/dL (ref 0–149)
VLDL Cholesterol Cal: 15 mg/dL (ref 5–40)

## 2024-01-11 LAB — CBC
Hematocrit: 36.6 % (ref 34.0–46.6)
Hemoglobin: 11.2 g/dL (ref 11.1–15.9)
MCH: 28.2 pg (ref 26.6–33.0)
MCHC: 30.6 g/dL — ABNORMAL LOW (ref 31.5–35.7)
MCV: 92 fL (ref 79–97)
Platelets: 213 10*3/uL (ref 150–450)
RBC: 3.97 x10E6/uL (ref 3.77–5.28)
RDW: 13.4 % (ref 11.7–15.4)
WBC: 3.8 10*3/uL (ref 3.4–10.8)

## 2024-01-11 LAB — TSH: TSH: 4.99 u[IU]/mL — ABNORMAL HIGH (ref 0.450–4.500)

## 2024-01-11 LAB — PREALBUMIN: PREALBUMIN: 22 mg/dL (ref 12–34)

## 2024-01-19 DIAGNOSIS — J3089 Other allergic rhinitis: Secondary | ICD-10-CM | POA: Diagnosis not present

## 2024-01-19 DIAGNOSIS — J3081 Allergic rhinitis due to animal (cat) (dog) hair and dander: Secondary | ICD-10-CM | POA: Diagnosis not present

## 2024-01-19 DIAGNOSIS — J301 Allergic rhinitis due to pollen: Secondary | ICD-10-CM | POA: Diagnosis not present

## 2024-01-19 NOTE — Assessment & Plan Note (Signed)
 She had kidney transplant June 2011.   On Cellcept, prednisone, and Prograf. Released from regular follow-ups. Upcoming nephrology appointment on May 7th. - Fax lab results to Dr. Rennie Casco at [facility]. - Attend follow-up appointment with nephrologist on May 7th.

## 2024-01-19 NOTE — Assessment & Plan Note (Signed)
 Chronic, diabetic foot exam was performed.  Diabetes well-controlled with [place] and [employer]. A1c was 5.5 in January. Prefers Libre 3 Plus. - Continue Horace Lye as prescribed. - Consider changing dosing for Mounjaro to every 3 week dosing due to continued weight loss. Encouraged to consider adding more protein/calories to her current diet. It appears she has had some muscle atrophy due to inadequate po intake.  - She is also followed by Endo  - Continue using Freestyle Libre for glucose monitoring. - Ensure Baqsimi is available for hypoglycemia management.

## 2024-01-26 DIAGNOSIS — J3089 Other allergic rhinitis: Secondary | ICD-10-CM | POA: Diagnosis not present

## 2024-01-26 DIAGNOSIS — J301 Allergic rhinitis due to pollen: Secondary | ICD-10-CM | POA: Diagnosis not present

## 2024-01-27 DIAGNOSIS — Z79899 Other long term (current) drug therapy: Secondary | ICD-10-CM | POA: Diagnosis not present

## 2024-01-27 DIAGNOSIS — Z940 Kidney transplant status: Secondary | ICD-10-CM | POA: Diagnosis not present

## 2024-01-31 DIAGNOSIS — J301 Allergic rhinitis due to pollen: Secondary | ICD-10-CM | POA: Diagnosis not present

## 2024-02-01 DIAGNOSIS — J3089 Other allergic rhinitis: Secondary | ICD-10-CM | POA: Diagnosis not present

## 2024-02-06 ENCOUNTER — Other Ambulatory Visit: Payer: Self-pay | Admitting: Internal Medicine

## 2024-02-06 DIAGNOSIS — E1122 Type 2 diabetes mellitus with diabetic chronic kidney disease: Secondary | ICD-10-CM

## 2024-02-08 ENCOUNTER — Other Ambulatory Visit: Payer: Self-pay

## 2024-02-08 ENCOUNTER — Other Ambulatory Visit (HOSPITAL_COMMUNITY): Payer: Self-pay

## 2024-02-08 DIAGNOSIS — N189 Chronic kidney disease, unspecified: Secondary | ICD-10-CM | POA: Diagnosis not present

## 2024-02-08 DIAGNOSIS — Z940 Kidney transplant status: Secondary | ICD-10-CM | POA: Diagnosis not present

## 2024-02-08 DIAGNOSIS — D631 Anemia in chronic kidney disease: Secondary | ICD-10-CM | POA: Diagnosis not present

## 2024-02-08 DIAGNOSIS — I129 Hypertensive chronic kidney disease with stage 1 through stage 4 chronic kidney disease, or unspecified chronic kidney disease: Secondary | ICD-10-CM | POA: Diagnosis not present

## 2024-02-08 MED ORDER — MOUNJARO 5 MG/0.5ML ~~LOC~~ SOAJ
SUBCUTANEOUS | 3 refills | Status: DC
Start: 2024-02-08 — End: 2024-08-28
  Filled 2024-02-08: qty 2, 28d supply, fill #0
  Filled 2024-03-19: qty 2, 28d supply, fill #1
  Filled 2024-04-11: qty 2, 28d supply, fill #2
  Filled 2024-05-10: qty 2, 28d supply, fill #3
  Filled 2024-06-06 – 2024-06-20 (×2): qty 2, 28d supply, fill #4
  Filled 2024-07-16: qty 2, 28d supply, fill #5
  Filled 2024-08-10: qty 2, 28d supply, fill #6

## 2024-02-09 DIAGNOSIS — J301 Allergic rhinitis due to pollen: Secondary | ICD-10-CM | POA: Diagnosis not present

## 2024-02-09 DIAGNOSIS — J3089 Other allergic rhinitis: Secondary | ICD-10-CM | POA: Diagnosis not present

## 2024-02-13 DIAGNOSIS — E113511 Type 2 diabetes mellitus with proliferative diabetic retinopathy with macular edema, right eye: Secondary | ICD-10-CM | POA: Diagnosis not present

## 2024-02-13 DIAGNOSIS — E113212 Type 2 diabetes mellitus with mild nonproliferative diabetic retinopathy with macular edema, left eye: Secondary | ICD-10-CM | POA: Diagnosis not present

## 2024-02-16 DIAGNOSIS — J3089 Other allergic rhinitis: Secondary | ICD-10-CM | POA: Diagnosis not present

## 2024-02-16 DIAGNOSIS — J301 Allergic rhinitis due to pollen: Secondary | ICD-10-CM | POA: Diagnosis not present

## 2024-02-17 DIAGNOSIS — E113593 Type 2 diabetes mellitus with proliferative diabetic retinopathy without macular edema, bilateral: Secondary | ICD-10-CM | POA: Diagnosis not present

## 2024-02-17 DIAGNOSIS — H2513 Age-related nuclear cataract, bilateral: Secondary | ICD-10-CM | POA: Diagnosis not present

## 2024-02-17 DIAGNOSIS — H31093 Other chorioretinal scars, bilateral: Secondary | ICD-10-CM | POA: Diagnosis not present

## 2024-02-17 DIAGNOSIS — H35033 Hypertensive retinopathy, bilateral: Secondary | ICD-10-CM | POA: Diagnosis not present

## 2024-02-22 DIAGNOSIS — I10 Essential (primary) hypertension: Secondary | ICD-10-CM | POA: Diagnosis not present

## 2024-02-24 DIAGNOSIS — J3081 Allergic rhinitis due to animal (cat) (dog) hair and dander: Secondary | ICD-10-CM | POA: Diagnosis not present

## 2024-02-24 DIAGNOSIS — J301 Allergic rhinitis due to pollen: Secondary | ICD-10-CM | POA: Diagnosis not present

## 2024-02-24 DIAGNOSIS — J3089 Other allergic rhinitis: Secondary | ICD-10-CM | POA: Diagnosis not present

## 2024-03-01 DIAGNOSIS — J301 Allergic rhinitis due to pollen: Secondary | ICD-10-CM | POA: Diagnosis not present

## 2024-03-01 DIAGNOSIS — J3089 Other allergic rhinitis: Secondary | ICD-10-CM | POA: Diagnosis not present

## 2024-03-02 DIAGNOSIS — E113591 Type 2 diabetes mellitus with proliferative diabetic retinopathy without macular edema, right eye: Secondary | ICD-10-CM | POA: Diagnosis not present

## 2024-03-08 DIAGNOSIS — Z01419 Encounter for gynecological examination (general) (routine) without abnormal findings: Secondary | ICD-10-CM | POA: Diagnosis not present

## 2024-03-08 DIAGNOSIS — Z124 Encounter for screening for malignant neoplasm of cervix: Secondary | ICD-10-CM | POA: Diagnosis not present

## 2024-03-09 DIAGNOSIS — J301 Allergic rhinitis due to pollen: Secondary | ICD-10-CM | POA: Diagnosis not present

## 2024-03-09 DIAGNOSIS — J3089 Other allergic rhinitis: Secondary | ICD-10-CM | POA: Diagnosis not present

## 2024-03-09 DIAGNOSIS — J3081 Allergic rhinitis due to animal (cat) (dog) hair and dander: Secondary | ICD-10-CM | POA: Diagnosis not present

## 2024-03-16 DIAGNOSIS — J301 Allergic rhinitis due to pollen: Secondary | ICD-10-CM | POA: Diagnosis not present

## 2024-03-16 DIAGNOSIS — J3089 Other allergic rhinitis: Secondary | ICD-10-CM | POA: Diagnosis not present

## 2024-03-16 DIAGNOSIS — J3081 Allergic rhinitis due to animal (cat) (dog) hair and dander: Secondary | ICD-10-CM | POA: Diagnosis not present

## 2024-03-21 DIAGNOSIS — J3089 Other allergic rhinitis: Secondary | ICD-10-CM | POA: Diagnosis not present

## 2024-03-21 DIAGNOSIS — J301 Allergic rhinitis due to pollen: Secondary | ICD-10-CM | POA: Diagnosis not present

## 2024-03-21 DIAGNOSIS — J3081 Allergic rhinitis due to animal (cat) (dog) hair and dander: Secondary | ICD-10-CM | POA: Diagnosis not present

## 2024-04-03 ENCOUNTER — Other Ambulatory Visit: Payer: Self-pay | Admitting: Internal Medicine

## 2024-04-04 DIAGNOSIS — J3089 Other allergic rhinitis: Secondary | ICD-10-CM | POA: Diagnosis not present

## 2024-04-04 DIAGNOSIS — J301 Allergic rhinitis due to pollen: Secondary | ICD-10-CM | POA: Diagnosis not present

## 2024-04-04 DIAGNOSIS — J3081 Allergic rhinitis due to animal (cat) (dog) hair and dander: Secondary | ICD-10-CM | POA: Diagnosis not present

## 2024-04-09 ENCOUNTER — Encounter: Payer: Self-pay | Admitting: Internal Medicine

## 2024-04-13 DIAGNOSIS — J301 Allergic rhinitis due to pollen: Secondary | ICD-10-CM | POA: Diagnosis not present

## 2024-04-13 DIAGNOSIS — J3081 Allergic rhinitis due to animal (cat) (dog) hair and dander: Secondary | ICD-10-CM | POA: Diagnosis not present

## 2024-04-13 DIAGNOSIS — J3089 Other allergic rhinitis: Secondary | ICD-10-CM | POA: Diagnosis not present

## 2024-04-19 DIAGNOSIS — J3081 Allergic rhinitis due to animal (cat) (dog) hair and dander: Secondary | ICD-10-CM | POA: Diagnosis not present

## 2024-04-19 DIAGNOSIS — J3089 Other allergic rhinitis: Secondary | ICD-10-CM | POA: Diagnosis not present

## 2024-04-19 DIAGNOSIS — J301 Allergic rhinitis due to pollen: Secondary | ICD-10-CM | POA: Diagnosis not present

## 2024-04-20 ENCOUNTER — Encounter: Payer: Self-pay | Admitting: Internal Medicine

## 2024-04-25 DIAGNOSIS — J3089 Other allergic rhinitis: Secondary | ICD-10-CM | POA: Diagnosis not present

## 2024-04-25 DIAGNOSIS — J301 Allergic rhinitis due to pollen: Secondary | ICD-10-CM | POA: Diagnosis not present

## 2024-05-01 DIAGNOSIS — R87610 Atypical squamous cells of undetermined significance on cytologic smear of cervix (ASC-US): Secondary | ICD-10-CM | POA: Diagnosis not present

## 2024-05-01 DIAGNOSIS — R87810 Cervical high risk human papillomavirus (HPV) DNA test positive: Secondary | ICD-10-CM | POA: Diagnosis not present

## 2024-05-03 DIAGNOSIS — Z79899 Other long term (current) drug therapy: Secondary | ICD-10-CM | POA: Diagnosis not present

## 2024-05-03 DIAGNOSIS — Z940 Kidney transplant status: Secondary | ICD-10-CM | POA: Diagnosis not present

## 2024-05-03 DIAGNOSIS — I129 Hypertensive chronic kidney disease with stage 1 through stage 4 chronic kidney disease, or unspecified chronic kidney disease: Secondary | ICD-10-CM | POA: Diagnosis not present

## 2024-05-03 DIAGNOSIS — E1122 Type 2 diabetes mellitus with diabetic chronic kidney disease: Secondary | ICD-10-CM | POA: Diagnosis not present

## 2024-05-03 DIAGNOSIS — N189 Chronic kidney disease, unspecified: Secondary | ICD-10-CM | POA: Diagnosis not present

## 2024-05-04 DIAGNOSIS — J301 Allergic rhinitis due to pollen: Secondary | ICD-10-CM | POA: Diagnosis not present

## 2024-05-04 DIAGNOSIS — J3089 Other allergic rhinitis: Secondary | ICD-10-CM | POA: Diagnosis not present

## 2024-05-04 DIAGNOSIS — J3081 Allergic rhinitis due to animal (cat) (dog) hair and dander: Secondary | ICD-10-CM | POA: Diagnosis not present

## 2024-05-11 DIAGNOSIS — J301 Allergic rhinitis due to pollen: Secondary | ICD-10-CM | POA: Diagnosis not present

## 2024-05-11 DIAGNOSIS — J3089 Other allergic rhinitis: Secondary | ICD-10-CM | POA: Diagnosis not present

## 2024-05-11 DIAGNOSIS — J3081 Allergic rhinitis due to animal (cat) (dog) hair and dander: Secondary | ICD-10-CM | POA: Diagnosis not present

## 2024-05-18 DIAGNOSIS — J301 Allergic rhinitis due to pollen: Secondary | ICD-10-CM | POA: Diagnosis not present

## 2024-05-18 DIAGNOSIS — J3081 Allergic rhinitis due to animal (cat) (dog) hair and dander: Secondary | ICD-10-CM | POA: Diagnosis not present

## 2024-05-18 DIAGNOSIS — J3089 Other allergic rhinitis: Secondary | ICD-10-CM | POA: Diagnosis not present

## 2024-05-25 DIAGNOSIS — J3081 Allergic rhinitis due to animal (cat) (dog) hair and dander: Secondary | ICD-10-CM | POA: Diagnosis not present

## 2024-05-25 DIAGNOSIS — J301 Allergic rhinitis due to pollen: Secondary | ICD-10-CM | POA: Diagnosis not present

## 2024-05-25 DIAGNOSIS — J3089 Other allergic rhinitis: Secondary | ICD-10-CM | POA: Diagnosis not present

## 2024-05-31 ENCOUNTER — Ambulatory Visit: Admitting: Internal Medicine

## 2024-06-01 DIAGNOSIS — J301 Allergic rhinitis due to pollen: Secondary | ICD-10-CM | POA: Diagnosis not present

## 2024-06-01 DIAGNOSIS — J3081 Allergic rhinitis due to animal (cat) (dog) hair and dander: Secondary | ICD-10-CM | POA: Diagnosis not present

## 2024-06-01 DIAGNOSIS — J3089 Other allergic rhinitis: Secondary | ICD-10-CM | POA: Diagnosis not present

## 2024-06-06 ENCOUNTER — Other Ambulatory Visit (HOSPITAL_COMMUNITY): Payer: Self-pay

## 2024-06-15 ENCOUNTER — Other Ambulatory Visit (HOSPITAL_COMMUNITY): Payer: Self-pay

## 2024-06-18 ENCOUNTER — Other Ambulatory Visit: Payer: Self-pay | Admitting: Internal Medicine

## 2024-06-18 ENCOUNTER — Other Ambulatory Visit (HOSPITAL_COMMUNITY): Payer: Self-pay

## 2024-06-18 ENCOUNTER — Other Ambulatory Visit: Payer: Self-pay

## 2024-06-18 DIAGNOSIS — Z1231 Encounter for screening mammogram for malignant neoplasm of breast: Secondary | ICD-10-CM

## 2024-06-20 DIAGNOSIS — J301 Allergic rhinitis due to pollen: Secondary | ICD-10-CM | POA: Diagnosis not present

## 2024-06-20 DIAGNOSIS — J3089 Other allergic rhinitis: Secondary | ICD-10-CM | POA: Diagnosis not present

## 2024-06-20 DIAGNOSIS — J3081 Allergic rhinitis due to animal (cat) (dog) hair and dander: Secondary | ICD-10-CM | POA: Diagnosis not present

## 2024-06-27 DIAGNOSIS — J3089 Other allergic rhinitis: Secondary | ICD-10-CM | POA: Diagnosis not present

## 2024-06-27 DIAGNOSIS — J301 Allergic rhinitis due to pollen: Secondary | ICD-10-CM | POA: Diagnosis not present

## 2024-06-28 ENCOUNTER — Ambulatory Visit
Admission: RE | Admit: 2024-06-28 | Discharge: 2024-06-28 | Disposition: A | Discharge status: Home / Self Care | Source: Ambulatory Visit | Attending: Internal Medicine | Admitting: Internal Medicine

## 2024-06-28 DIAGNOSIS — Z1231 Encounter for screening mammogram for malignant neoplasm of breast: Secondary | ICD-10-CM

## 2024-07-06 DIAGNOSIS — E113593 Type 2 diabetes mellitus with proliferative diabetic retinopathy without macular edema, bilateral: Secondary | ICD-10-CM | POA: Diagnosis not present

## 2024-07-06 DIAGNOSIS — H31093 Other chorioretinal scars, bilateral: Secondary | ICD-10-CM | POA: Diagnosis not present

## 2024-07-06 DIAGNOSIS — H35033 Hypertensive retinopathy, bilateral: Secondary | ICD-10-CM | POA: Diagnosis not present

## 2024-07-06 DIAGNOSIS — H2513 Age-related nuclear cataract, bilateral: Secondary | ICD-10-CM | POA: Diagnosis not present

## 2024-07-19 ENCOUNTER — Emergency Department (HOSPITAL_BASED_OUTPATIENT_CLINIC_OR_DEPARTMENT_OTHER)

## 2024-07-19 ENCOUNTER — Emergency Department (HOSPITAL_BASED_OUTPATIENT_CLINIC_OR_DEPARTMENT_OTHER)
Admission: EM | Admit: 2024-07-19 | Discharge: 2024-07-19 | Disposition: A | Discharge status: Home / Self Care | Attending: Emergency Medicine | Admitting: Emergency Medicine

## 2024-07-19 ENCOUNTER — Ambulatory Visit: Admitting: Internal Medicine

## 2024-07-19 ENCOUNTER — Other Ambulatory Visit: Payer: Self-pay

## 2024-07-19 DIAGNOSIS — I10 Essential (primary) hypertension: Secondary | ICD-10-CM | POA: Insufficient documentation

## 2024-07-19 DIAGNOSIS — Z79899 Other long term (current) drug therapy: Secondary | ICD-10-CM | POA: Insufficient documentation

## 2024-07-19 DIAGNOSIS — D72819 Decreased white blood cell count, unspecified: Secondary | ICD-10-CM | POA: Insufficient documentation

## 2024-07-19 DIAGNOSIS — R748 Abnormal levels of other serum enzymes: Secondary | ICD-10-CM | POA: Diagnosis not present

## 2024-07-19 DIAGNOSIS — E119 Type 2 diabetes mellitus without complications: Secondary | ICD-10-CM | POA: Insufficient documentation

## 2024-07-19 DIAGNOSIS — Z794 Long term (current) use of insulin: Secondary | ICD-10-CM | POA: Insufficient documentation

## 2024-07-19 DIAGNOSIS — R197 Diarrhea, unspecified: Secondary | ICD-10-CM | POA: Diagnosis not present

## 2024-07-19 DIAGNOSIS — K529 Noninfective gastroenteritis and colitis, unspecified: Secondary | ICD-10-CM | POA: Diagnosis not present

## 2024-07-19 LAB — URINALYSIS, ROUTINE W REFLEX MICROSCOPIC
Bilirubin Urine: NEGATIVE
Glucose, UA: NEGATIVE mg/dL
Hgb urine dipstick: NEGATIVE
Ketones, ur: NEGATIVE mg/dL
Leukocytes,Ua: NEGATIVE
Nitrite: NEGATIVE
Protein, ur: 100 mg/dL — AB
Specific Gravity, Urine: 1.022 (ref 1.005–1.030)
pH: 6 (ref 5.0–8.0)

## 2024-07-19 LAB — DIFFERENTIAL
Abs Immature Granulocytes: 0.01 K/uL (ref 0.00–0.07)
Basophils Absolute: 0 K/uL (ref 0.0–0.1)
Basophils Relative: 0 %
Eosinophils Absolute: 0 K/uL (ref 0.0–0.5)
Eosinophils Relative: 1 %
Immature Granulocytes: 0 %
Lymphocytes Relative: 14 %
Lymphs Abs: 0.4 K/uL — ABNORMAL LOW (ref 0.7–4.0)
Monocytes Absolute: 0.3 K/uL (ref 0.1–1.0)
Monocytes Relative: 12 %
Neutro Abs: 2 K/uL (ref 1.7–7.7)
Neutrophils Relative %: 73 %

## 2024-07-19 LAB — CBC
HCT: 34.5 % — ABNORMAL LOW (ref 36.0–46.0)
HCT: 33.7 % — ABNORMAL LOW (ref 36.0–46.0)
Hemoglobin: 11.1 g/dL — ABNORMAL LOW (ref 12.0–15.0)
Hemoglobin: 10.8 g/dL — ABNORMAL LOW (ref 12.0–15.0)
MCH: 28.7 pg (ref 26.0–34.0)
MCH: 28.6 pg (ref 26.0–34.0)
MCHC: 32.2 g/dL (ref 30.0–36.0)
MCHC: 32 g/dL (ref 30.0–36.0)
MCV: 89.1 fL (ref 80.0–100.0)
MCV: 89.4 fL (ref 80.0–100.0)
Platelets: 261 K/uL (ref 150–400)
Platelets: 217 K/uL (ref 150–400)
RBC: 3.87 MIL/uL (ref 3.87–5.11)
RBC: 3.77 MIL/uL — ABNORMAL LOW (ref 3.87–5.11)
RDW: 13.3 % (ref 11.5–15.5)
RDW: 13.2 % (ref 11.5–15.5)
WBC: 2.9 K/uL — ABNORMAL LOW (ref 4.0–10.5)
WBC: 2.7 K/uL — ABNORMAL LOW (ref 4.0–10.5)
nRBC: 0 % (ref 0.0–0.2)
nRBC: 0 % (ref 0.0–0.2)

## 2024-07-19 LAB — COMPREHENSIVE METABOLIC PANEL WITH GFR
ALT: 63 U/L — ABNORMAL HIGH (ref 0–44)
AST: 29 U/L (ref 15–41)
Albumin: 3.9 g/dL (ref 3.5–5.0)
Alkaline Phosphatase: 161 U/L — ABNORMAL HIGH (ref 38–126)
Anion gap: 9 (ref 5–15)
BUN: 29 mg/dL — ABNORMAL HIGH (ref 6–20)
CO2: 17 mmol/L — ABNORMAL LOW (ref 22–32)
Calcium: 8.9 mg/dL (ref 8.9–10.3)
Chloride: 115 mmol/L — ABNORMAL HIGH (ref 98–111)
Creatinine, Ser: 1.54 mg/dL — ABNORMAL HIGH (ref 0.44–1.00)
GFR, Estimated: 41 mL/min — ABNORMAL LOW (ref >60)
Glucose, Bld: 162 mg/dL — ABNORMAL HIGH (ref 70–99)
Potassium: 4.4 mmol/L (ref 3.5–5.1)
Sodium: 141 mmol/L (ref 135–145)
Total Bilirubin: 0.4 mg/dL (ref 0.0–1.2)
Total Protein: 6.4 g/dL — ABNORMAL LOW (ref 6.5–8.1)

## 2024-07-19 LAB — PREGNANCY, URINE: Preg Test, Ur: NEGATIVE

## 2024-07-19 LAB — LIPASE, BLOOD: Lipase: 22 U/L (ref 11–51)

## 2024-07-19 LAB — C DIFFICILE QUICK SCREEN W PCR REFLEX
C Diff antigen: NEGATIVE
C Diff interpretation: NOT DETECTED
C Diff toxin: NEGATIVE

## 2024-07-19 LAB — LACTIC ACID, PLASMA: Lactic Acid, Venous: 0.8 mmol/L (ref 0.5–1.9)

## 2024-07-19 MED ORDER — LACTATED RINGERS IV BOLUS
1000.0000 mL | Freq: Once | INTRAVENOUS | Status: AC
  Administered 2024-07-19: 1000 mL via INTRAVENOUS

## 2024-07-19 NOTE — Discharge Instructions (Signed)
 You will receive a call if any of your stool testing is positive.  Will hold off on starting any antibiotics until we know if this is infectious as certain bacteria can actually be made worse with antibiotics.  In the meantime I recommend maintaining a bland diet to help slow diarrhea and drinking Gatorade Pedialyte or other electrolyte supplement to help make sure you are staying hydrated and replenishing electrolytes that are being lost.  Please call to schedule close follow-up appointment with your primary care provider.

## 2024-07-19 NOTE — ED Notes (Signed)
 DC paperwork given and verbally understood.

## 2024-07-19 NOTE — ED Triage Notes (Signed)
 Pt caox4 ambulatory c/o diarrhea x1.5 wks which started while in [place]. Denies abd pain, N/V. PMH kidney transplant.

## 2024-07-19 NOTE — ED Provider Notes (Signed)
 [facility] AT [facility] Provider Note   CSN: [id number] Arrival date & time: 07/19/24  1238     Patient presents with: Diarrhea   [name] is a 48 y.o. female.   [name] is a 48 y.o. female with a history of renal transplant, hypertension, hyperlipidemia, diabetes, who presents to the emergency department for evaluation of diarrhea.  Patient reports that she has been having diarrhea for about 10 days.  This started while she was in [place] for a vacation but has persisted since she returned home about 5 days ago.  She reports having 5-10 watery bowel movements a day.  She has not noticed any blood or melena.  She denies associated abdominal pain, nausea or vomiting.  She does report that she feels like food goes right through her and causes her to have more diarrhea.  She does report eating a lot of different foods while in [place] and wonders if this triggered things.  Despite that she has maintained her immunosuppressive medications for her kidney function and reports normal urine output.  She has not had any fevers or chills.  She tried some Imodium without relief.  No other aggravating or alleviating factors.  Kidney transplant was performed at [facility] 13 years ago.  The history is provided by the patient and medical records.  Diarrhea Associated symptoms: no abdominal pain, no chills, no fever and no vomiting        Prior to Admission medications   Medication Sig Start Date End Date Taking? Authorizing Provider  albuterol (VENTOLIN HFA) 108 (90 Base) MCG/ACT inhaler Inhale 1-2 puffs into the lungs every 4 (four) hours as needed for wheezing or shortness of breath. 07/07/21   [provider]  amLODipine  (NORVASC ) 10 MG tablet Take 10 mg by mouth every evening. 10/23/21   [provider]  azelastine (OPTIVAR) 0.05 % ophthalmic solution Place 2 drops into both eyes 2 (two) times daily as needed (allergies). 12/06/21   [provider]  BAQSIMI  ONE PACK 3 MG/DOSE POWD PLACE 3 MG INTO THE NOSE ONCE AS NEEDED FOR UP TO 1 DOSE. 06/14/22   Trixie File, MD  Cholecalciferol (VITAMIN D3) 125 MCG (5000 UT) CAPS Take 5,000 Units by mouth daily.    [provider]  Continuous Glucose Sensor (DEXCOM G7 SENSOR) MISC Change sensor every 10 days 09/21/23   Trixie File, MD  Continuous Glucose Sensor (DEXCOM G7 SENSOR) MISC Use to check glucose continuously, change sensor every 10 days 10/06/23   Trixie File, MD  Continuous Glucose Sensor (FREESTYLE LIBRE 3 PLUS SENSOR) MISC INJECT 1 DEVICE INTO THE SKIN CONTINUOUS. CHANGE EVERY 15 DAYS 10/11/23   Trixie File, MD  diphenhydrAMINE  (BENADRYL ) 25 MG tablet Take 50 mg by mouth every 6 (six) hours as needed for itching or allergies.    [provider]  insulin  degludec (TRESIBA  FLEXTOUCH) 100 UNIT/ML FlexTouch Pen INJECT 20 UNITS INTO THE SKIN DAILY 04/04/24   Trixie File, MD  Insulin  Pen Needle (BD PEN NEEDLE NANO 2ND GEN) 32G X 4 MM MISC USE 3X A DAY 03/24/23   Trixie File, MD  levonorgestrel (MIRENA) 20 MCG/24HR IUD by Intrauterine route.    [provider]  LORazepam (ATIVAN) 1 MG tablet Take 1 mg by mouth every 6 (six) hours as needed for anxiety. 11/27/21   [provider]  mycophenolate  (CELLCEPT ) 250 MG capsule Take 500-750 mg by mouth See admin instructions. 500 mg every morning  750 mg every  evening    [provider]  predniSONE  (DELTASONE ) 5 MG tablet Take 5 mg by mouth daily.    [provider]  tacrolimus  (PROGRAF ) 0.5 MG capsule Take 2.5-3 mg by mouth See admin instructions. 2.5 mg every morning  3 mg every evening 01/25/19   [provider]  tirzepatide  (MOUNJARO ) 5 MG/0.5ML Pen Inject under skin 5 mg every other week 02/08/24   Trixie File, MD  traZODone (DESYREL) 50 MG tablet Take 50-100 mg by mouth at bedtime as needed for sleep. 11/27/21   [provider]   trimethoprim (TRIMPEX) 100 MG tablet Take 100 mg by mouth daily. 12/06/21   [provider]    Allergies: Adhesive [tape], Strawberry (diagnostic), and Wound dressing adhesive    Review of Systems  Constitutional:  Negative for chills and fever.  Gastrointestinal:  Positive for diarrhea. Negative for abdominal pain, nausea and vomiting.    Updated Vital Signs BP (!) 129/54 (BP Location: Right Arm)   Pulse 72   Temp 97.9 F (36.6 C)   Resp 16   Ht 5' 5 (1.651 m)   Wt 50.8 kg   SpO2 100%   BMI 18.64 kg/m   Physical Exam Vitals and nursing note reviewed.  Constitutional:      General: She is not in acute distress.    Appearance: Normal appearance. She is well-developed. She is not ill-appearing or diaphoretic.     Comments: Well-appearing and in no acute distress  HENT:     Head: Normocephalic and atraumatic.     Mouth/Throat:     Mouth: Mucous membranes are moist.     Pharynx: Oropharynx is clear.  Eyes:     General:        Right eye: No discharge.        Left eye: No discharge.  Cardiovascular:     Rate and Rhythm: Normal rate and regular rhythm.     Pulses: Normal pulses.     Heart sounds: Normal heart sounds.  Pulmonary:     Effort: Pulmonary effort is normal. No respiratory distress.     Breath sounds: Normal breath sounds. No wheezing or rales.     Comments: Respirations equal and unlabored, patient able to speak in full sentences, lungs clear to auscultation bilaterally  Abdominal:     General: Bowel sounds are normal. There is no distension.     Palpations: Abdomen is soft. There is no mass.     Tenderness: There is no abdominal tenderness. There is no guarding.     Comments: Abdomen soft, nondistended, nontender to palpation in all quadrants without guarding or peritoneal signs  Musculoskeletal:        General: No deformity.     Cervical back: Neck supple.  Skin:    General: Skin is warm and dry.     Capillary Refill: Capillary refill takes less  than 2 seconds.  Neurological:     Mental Status: She is alert and oriented to person, place, and time.     Coordination: Coordination normal.     Comments: Speech is clear, able to follow commands Moves extremities without ataxia, coordination intact  Psychiatric:        Mood and Affect: Mood normal.        Behavior: Behavior normal.     (all labs ordered are listed, but only abnormal results are displayed) Labs Reviewed  GASTROINTESTINAL PANEL BY PCR, STOOL (REPLACES STOOL CULTURE)    All other components within normal limits  COMPREHENSIVE METABOLIC PANEL WITH GFR - Abnormal; Notable for the following components:   Chloride 115 (*)    CO2 17 (*)    Glucose, Bld 162 (*)    BUN 29 (*)    Creatinine, Ser 1.54 (*)    Total Protein 6.4 (*)    ALT 63 (*)    Alkaline Phosphatase 161 (*)    GFR, Estimated 41 (*)    All other components within normal limits  CBC - Abnormal; Notable for the following components:   WBC 2.9 (*)    Hemoglobin 11.1 (*)    HCT 34.5 (*)    All other components within normal limits  URINALYSIS, ROUTINE W REFLEX MICROSCOPIC - Abnormal; Notable for the following components:   Protein, ur 100 (*)    Bacteria, UA RARE (*)    All other components within normal limits  DIFFERENTIAL - Abnormal; Notable for the following components:   Lymphs Abs 0.4 (*)    All other components within normal limits  CBC - Abnormal; Notable for the following components:   WBC 2.7 (*)    RBC 3.77 (*)    Hemoglobin 10.8 (*)    HCT 33.7 (*)    All other components within normal limits  C DIFFICILE QUICK SCREEN W PCR REFLEX    LIPASE, BLOOD  PREGNANCY, URINE  LACTIC ACID, PLASMA  MISCELLANEOUS TEST    EKG: None  Radiology: CT ABDOMEN PELVIS WO CONTRAST Result Date: 07/19/2024 CLINICAL DATA:  Diarrhea Diarrhea, immunocompromised, assess for colitis EXAM: CT ABDOMEN AND PELVIS WITHOUT CONTRAST TECHNIQUE: Multidetector CT imaging of the abdomen and pelvis was performed  following the standard protocol without IV contrast. RADIATION DOSE REDUCTION: This exam was performed according to the departmental dose-optimization program which includes automated exposure control, adjustment of the mA and/or kV according to patient size and/or use of iterative reconstruction technique. COMPARISON:  12/21/2021 FINDINGS: Lower chest: No acute findings extensive coronary artery calcifications, advanced for patient's age. No effusions. Hepatobiliary: No focal hepatic abnormality. Gallbladder unremarkable. Pancreas: No focal abnormality or ductal dilatation. Spleen: No focal abnormality.  Normal size. Adrenals/Urinary Tract: Adrenal glands normal. 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 No hydronephrosis is crash that no hydronephrosis. Right lower quadrant renal transplant noted also without hydronephrosis. Small low-density lesion measures 1.5 cm compared to 1.1 cm previously. This most likely reflects a small cyst. No follow-up imaging recommended. Urinary bladder unremarkable. Stomach/Bowel: Fluid throughout the colon with air-fluid levels which may reflect a diarrheal condition. No areas of wall thickening or surrounding inflammatory change to suggest colitis. Stomach and small bowel decompressed. Vascular/Lymphatic: Diffuse aortoiliac and branch vessel atherosclerosis. No evidence of aneurysm or adenopathy. Reproductive: Uterus and adnexa unremarkable. No mass. IUD in the uterus. Other: No free fluid or free air. Musculoskeletal: No acute bony abnormality. IMPRESSION: No evidence of colitis. Fluid throughout the colon with air-fluid levels may reflect diarrhea. Diffuse aortic/arterial atherosclerosis, including coronary artery disease. Right lower quadrant renal transplant grossly unremarkable. Electronically Signed   By: Franky Crease M.D.   On: 07/19/2024 17:21     Procedures   Medications Ordered in the ED  lactated ringers   bolus 1,000 mL (0 mLs Intravenous Stopped 07/19/24 1524)                                    Medical Decision Making Amount and/or Complexity of Data Reviewed Labs: ordered.  Radiology: ordered.   48 year old female with history of renal transplant on immunosuppressive's, presents with 10 days of diarrhea, 5-10 watery bowel movements per day, nonbloody.  No fevers and patient is well-appearing without associated abdominal pain, mildly hypertensive but vitals otherwise normal.  No focal tenderness on abdominal exam.  Will collect stool studies if patient is able to provide a sample.  Lab work shows mild leukopenia, creatinine of 1.54, baseline of 1.2, BUN of 29, mildly elevated glucose and CO2 of 17, normal lactic acid, mild elevation of alk phos but normal bili.  Urinalysis without signs of infection 100 protein but no hematuria noted.  C. difficile and GI pathogen panel pending.  CT abdomen pelvis without contrast obtained, viewed and interpreted independently, no evidence of colitis, evidence of liquid stool present but no other acute findings.  Patient received IV fluid bolus.  Overall feeling improved after IV fluids.  Would prefer to go home and follow-up with results of pending stool studies for near which I feel is reasonable given overall reassuring lab work and abdominal exam.  Given strict return precautions and encouraged to follow-up closely with a primary care doctor.  Also discussed bland diet to help with diarrhea.     Final diagnoses:  Diarrhea, unspecified type    ED Discharge Orders     None          Alva Larraine FALCON, PA-C 07/23/24 1055    Mannie Pac T, DO 07/27/24 2300

## 2024-07-19 NOTE — ED Notes (Addendum)
 Urine and blood obtained in Triage.SABRASABRA

## 2024-07-20 ENCOUNTER — Encounter: Payer: Self-pay | Admitting: Internal Medicine

## 2024-07-20 ENCOUNTER — Telehealth (HOSPITAL_COMMUNITY): Payer: Self-pay

## 2024-07-20 ENCOUNTER — Telehealth (HOSPITAL_BASED_OUTPATIENT_CLINIC_OR_DEPARTMENT_OTHER): Payer: Self-pay | Admitting: Emergency Medicine

## 2024-07-20 LAB — GASTROINTESTINAL PANEL BY PCR, STOOL (REPLACES STOOL CULTURE)
Adenovirus F40/41: NOT DETECTED
Astrovirus: NOT DETECTED
Campylobacter species: NOT DETECTED
Cryptosporidium: NOT DETECTED
Cyclospora cayetanensis: NOT DETECTED
Entamoeba histolytica: NOT DETECTED
Enteroaggregative E coli (EAEC): DETECTED — AB
Enteropathogenic E coli (EPEC): DETECTED — AB
Enterotoxigenic E coli (ETEC): DETECTED — AB
Giardia lamblia: NOT DETECTED
Norovirus GI/GII: DETECTED — AB
Plesimonas shigelloides: NOT DETECTED
Rotavirus A: NOT DETECTED
Salmonella species: NOT DETECTED
Sapovirus (I, II, IV, and V): NOT DETECTED
Shiga like toxin producing E coli (STEC): NOT DETECTED
Shigella/Enteroinvasive E coli (EIEC): NOT DETECTED
Vibrio cholerae: NOT DETECTED
Vibrio species: DETECTED — AB
Yersinia enterocolitica: NOT DETECTED

## 2024-07-20 NOTE — Telephone Encounter (Signed)
 Pt. Called and requested medication to be called in for her + Stool specimen results.  I referred to the note by [facility] Rn and Dr. Patsey, which he reviewed results and recommended pt. To follow-up with her PCP.  I informed pt.to follow-up with her PCP.  She verbalized understanding.

## 2024-07-30 ENCOUNTER — Ambulatory Visit: Payer: Self-pay

## 2024-07-30 NOTE — Telephone Encounter (Signed)
 FYI Only or Action Required?: FYI only for provider.  Patient was last seen in primary care on 01/10/2024 by Jarold Medici, MD.  Called Nurse Triage reporting Diarrhea.  Symptoms began several weeks ago.  Interventions attempted: Nothing.  Symptoms are: unchanged.  Triage Disposition: See PCP When Office is Open (Within 3 Days)  Patient/caregiver understands and will follow disposition?: Yes Reason for Disposition  [1] MILD diarrhea (e.g., 1-3 or more stools than normal in past 24 hours) AND [2] present >  7 days  (Exception: Chronic diarrhea that is not worse.)  Answer Assessment - Initial Assessment Questions Patient was seen in ED on 10/16, GI panel came back pos vibrio, pos norovirus , EAEC positive , EPEC positive ,ETEC positive. Patient has not received any antibiotic treatment. Patient states she also feels like she's getting a UTI, since yesterday experiencing urinary frequency and chills, denies burning or hematuria. No available appointments in PCP office, called [facility] and spoke to , to assist with scheduling patient, she stated to change it to acute visit instead.   1. DIARRHEA SEVERITY: How bad is the diarrhea? How many more stools have you had in the past 24 hours than normal?      States if drinks or eats something, goes straight through  2. ONSET: When did the diarrhea begin?      Almost 2 weeks ago  3. STOOL DESCRIPTION:  How loose or watery is the diarrhea? What is the stool color? Is there any blood or mucous in the stool?     Watery  4. VOMITING: Are you also vomiting? If Yes, ask: How many times in the past 24 hours?      Denies  5. ABDOMEN PAIN: Are you having any abdomen pain? If Yes, ask: What does it feel like? (e.g., crampy, dull, intermittent, constant)      Denies  6. ABDOMEN PAIN SEVERITY: If present, ask: How bad is the pain?  (e.g., Scale 1-10; mild, moderate, or severe)     N/A  7. ORAL INTAKE: If vomiting, Have you been able  to drink liquids? How much liquids have you had in the past 24 hours?     Yes  8. HYDRATION: Any signs of dehydration? (e.g., dry mouth [not just dry lips], too weak to stand, dizziness, new weight loss) When did you last urinate?     Been trying to drink Gatorades and electrolyte drinks.   10. ANTIBIOTIC USE: Are you taking antibiotics now or have you taken antibiotics in the past 2 months?       Denies  11. OTHER SYMPTOMS: Do you have any other symptoms? (e.g., fever, blood in stool)       Denies  Protocols used: Diarrhea-A-AH

## 2024-07-30 NOTE — Telephone Encounter (Signed)
 FYI Only or Action Required?: FYI only for provider.  Patient was last seen in primary care on 01/10/2024 by Jarold Medici, MD.  This RN attempted to contact patient for triage. No answer, voicemail left requesting return call to clinic.   Copied from CRM [id number]. Topic: Clinical - Pink Word Triage >> Jul 30, 2024  7:56 AM Delon HERO wrote: [place] Word triggered transfer to Nurse Triage. See Triage Message for details. Patent went to the ED 07/19/2024- Reporting diarrhea - Still reporting diarrhea. Reporting frequency urinate, no pain

## 2024-07-31 ENCOUNTER — Encounter: Payer: Self-pay | Admitting: Internal Medicine

## 2024-07-31 ENCOUNTER — Ambulatory Visit: Payer: Self-pay | Admitting: Internal Medicine

## 2024-07-31 VITALS — BP 108/70 | HR 64 | Temp 98.3°F | Ht 65.0 in | Wt 106.0 lb

## 2024-07-31 DIAGNOSIS — E860 Dehydration: Secondary | ICD-10-CM

## 2024-07-31 DIAGNOSIS — N3001 Acute cystitis with hematuria: Secondary | ICD-10-CM | POA: Diagnosis not present

## 2024-07-31 DIAGNOSIS — Z794 Long term (current) use of insulin: Secondary | ICD-10-CM

## 2024-07-31 DIAGNOSIS — N182 Chronic kidney disease, stage 2 (mild): Secondary | ICD-10-CM

## 2024-07-31 DIAGNOSIS — A09 Infectious gastroenteritis and colitis, unspecified: Secondary | ICD-10-CM | POA: Diagnosis not present

## 2024-07-31 DIAGNOSIS — R319 Hematuria, unspecified: Secondary | ICD-10-CM

## 2024-07-31 DIAGNOSIS — E46 Unspecified protein-calorie malnutrition: Secondary | ICD-10-CM | POA: Diagnosis not present

## 2024-07-31 DIAGNOSIS — K529 Noninfective gastroenteritis and colitis, unspecified: Secondary | ICD-10-CM

## 2024-07-31 DIAGNOSIS — E1122 Type 2 diabetes mellitus with diabetic chronic kidney disease: Secondary | ICD-10-CM

## 2024-07-31 LAB — POCT URINALYSIS DIP (CLINITEK)
Bilirubin, UA: NEGATIVE
Glucose, UA: NEGATIVE mg/dL
Ketones, POC UA: NEGATIVE mg/dL
Nitrite, UA: NEGATIVE
POC PROTEIN,UA: >=300 — AB
Spec Grav, UA: 1.015 (ref 1.010–1.025)
Urobilinogen, UA: 0.2 U/dL
pH, UA: 6 (ref 5.0–8.0)

## 2024-07-31 MED ORDER — ONDANSETRON 4 MG PO TBDP: 4.0000 mg | ORAL_TABLET | Freq: Three times a day (TID) | ORAL | 0 refills | Status: AC | PRN

## 2024-07-31 MED ORDER — CEFTRIAXONE SODIUM 250 MG IJ SOLR
500.0000 mg | Freq: Once | INTRAMUSCULAR | Status: AC
  Administered 2024-07-31: 500 mg via INTRAMUSCULAR

## 2024-07-31 NOTE — Progress Notes (Signed)
 [name], CMA,acting as a neurosurgeon for Joyce LOISE Slocumb, MD.,have documented all relevant documentation on the behalf of Joyce LOISE Slocumb, MD,as directed by  Joyce LOISE Slocumb, MD while in the presence of Joyce LOISE Slocumb, MD.  Subjective:  Patient ID: [name] , female    DOB: December 24, 1975 , 48 y.o.   MRN: [id number]  Chief Complaint  Patient presents with   ER f/u    Patient reports she is not feeling well. She feels she may have a uti. She has been having diarrhea and vomiting. She reports she recently had E, Coli. The diarrhea started about a couple weeks ago. She reports she went to [place] at the beginning of the month. She reports she has been having the urgency and frequently going to the bathroom.     HPI Discussed the use of AI scribe software for clinical note transcription with the patient, who gave verbal consent to proceed.  History of Present Illness [name] is a 48 year old female who presents with symptoms of a urinary tract infection and gastrointestinal issues following recent travel.  She experiences urinary frequency and urgency, which she associates with a urinary tract infection. She has no known medication allergies but is allergic to adhesive tape.  She recently traveled to [place] from October 3rd to October 14th. Approximately two days into her trip, she began experiencing severe diarrhea, characterized by an inability to retain food. She also reports vomiting, stating that everything she tries to eat comes straight out or she throws it back up.  Shortly after returning from her trip, she visited the emergency room, where a stool culture was performed. She was informed of a diagnosis of Vibrio and E. coli but was not provided with treatment. She feels tired and weak and has been attempting to stay hydrated with Powerade and water.  She is currently on 5 mg of Tirzepatide  (Medjaro) every other week for diabetes management. She has not been prescribed any  medication for nausea and expresses frustration with the lack of treatment provided during her emergency room visit.  She has been trying to maintain her hydration and electrolyte levels despite her symptoms. She wants to return to work, noting that she recently started a new job at [employer].     Past Medical History:  Diagnosis Date   Arthritis    Diabetes mellitus    HTN (hypertension)    Hyperlipidemia    Pyelonephritis 09/30/11   Renal failure    S/P kidney transplant      Family History  Problem Relation Age of Onset   Diabetes Mother    Arthritis Mother    Arthritis Father    Prostate cancer Father    Hypertension Father    Breast cancer Maternal Aunt    Breast cancer Paternal Aunt      Current Outpatient Medications:    albuterol (VENTOLIN HFA) 108 (90 Base) MCG/ACT inhaler, Inhale 1-2 puffs into the lungs every 4 (four) hours as needed for wheezing or shortness of breath., Disp: , Rfl:    amLODipine  (NORVASC ) 10 MG tablet, Take 10 mg by mouth every evening., Disp: , Rfl:    azelastine (OPTIVAR) 0.05 % ophthalmic solution, Place 2 drops into both eyes 2 (two) times daily as needed (allergies)., Disp: , Rfl:    BAQSIMI  ONE PACK 3 MG/DOSE POWD, PLACE 3 MG INTO THE NOSE ONCE AS NEEDED FOR UP TO 1 DOSE., Disp: 1 each, Rfl: 6   Cholecalciferol (  VITAMIN D3) 125 MCG (5000 UT) CAPS, Take 5,000 Units by mouth daily., Disp: , Rfl:    Continuous Glucose Sensor (DEXCOM G7 SENSOR) MISC, Change sensor every 10 days, Disp: 9 each, Rfl: 3   Continuous Glucose Sensor (DEXCOM G7 SENSOR) MISC, Use to check glucose continuously, change sensor every 10 days, Disp: 9 each, Rfl: 3   Continuous Glucose Sensor (FREESTYLE LIBRE 3 PLUS SENSOR) MISC, INJECT 1 DEVICE INTO THE SKIN CONTINUOUS. CHANGE EVERY 15 DAYS, Disp: 6 each, Rfl: 3   diphenhydrAMINE  (BENADRYL ) 25 MG tablet, Take 50 mg by mouth every 6 (six) hours as needed for itching or allergies., Disp: , Rfl:    doxycycline (VIBRA-TABS) 100  MG tablet, Take 1 tablet (100 mg total) by mouth 2 (two) times daily., Disp: 14 tablet, Rfl: 0   insulin  degludec (TRESIBA  FLEXTOUCH) 100 UNIT/ML FlexTouch Pen, INJECT 20 UNITS INTO THE SKIN DAILY, Disp: 15 mL, Rfl: 3   Insulin  Pen Needle (BD PEN NEEDLE NANO 2ND GEN) 32G X 4 MM MISC, USE 3X A DAY, Disp: 300 each, Rfl: 3   levonorgestrel (MIRENA) 20 MCG/24HR IUD, by Intrauterine route., Disp: , Rfl:    LORazepam (ATIVAN) 1 MG tablet, Take 1 mg by mouth every 6 (six) hours as needed for anxiety., Disp: , Rfl:    mycophenolate  (CELLCEPT ) 250 MG capsule, Take 500-750 mg by mouth See admin instructions. 500 mg every morning  750 mg every evening, Disp: , Rfl:    ondansetron  (ZOFRAN -ODT) 4 MG disintegrating tablet, Take 1 tablet (4 mg total) by mouth every 8 (eight) hours as needed for nausea or vomiting., Disp: 20 tablet, Rfl: 0   predniSONE  (DELTASONE ) 5 MG tablet, Take 5 mg by mouth daily., Disp: , Rfl:    tacrolimus  (PROGRAF ) 0.5 MG capsule, Take 2.5-3 mg by mouth See admin instructions. 2.5 mg every morning  3 mg every evening, Disp: , Rfl:    tirzepatide  (MOUNJARO ) 5 MG/0.5ML Pen, Inject under skin 5 mg every other week, Disp: 4 mL, Rfl: 3   traZODone (DESYREL) 50 MG tablet, Take 50-100 mg by mouth at bedtime as needed for sleep., Disp: , Rfl:    trimethoprim (TRIMPEX) 100 MG tablet, Take 100 mg by mouth daily., Disp: , Rfl:    Allergies  Allergen Reactions   Adhesive [Tape] Other (See Comments)    plastic tape irritates skin   Ok with paper tape   Strawberry (Diagnostic)    Wound Dressing Adhesive     Other reaction(s): Other plastic tape irritates skin   Ok with paper tape     Review of Systems  Constitutional:  Positive for appetite change and fatigue.  Respiratory: Negative.    Cardiovascular: Negative.   Gastrointestinal:  Positive for diarrhea, nausea and vomiting.  Neurological: Negative.   Psychiatric/Behavioral: Negative.       Today's Vitals   07/31/24 1634  BP: 108/70   Pulse: 64  Temp: 98.3 F (36.8 C)  TempSrc: Oral  Weight: 106 lb (48.1 kg)  Height: 5' 5 (1.651 m)  PainSc: 0-No pain   Body mass index is 17.64 kg/m.  Wt Readings from Last 3 Encounters:  07/31/24 106 lb (48.1 kg)  07/19/24 112 lb (50.8 kg)  01/10/24 112 lb 6.4 oz (51 kg)    The ASCVD Risk score (Arnett DK, et al., 2019) failed to calculate for the following reasons:   The valid total cholesterol range is 130 to 320 mg/dL  Objective:  Physical Exam Vitals and nursing note reviewed.  Constitutional:      Appearance: Normal appearance. She is ill-appearing. She is not diaphoretic.  HENT:     Head: Normocephalic and atraumatic.  Eyes:     Extraocular Movements: Extraocular movements intact.  Cardiovascular:     Rate and Rhythm: Normal rate and regular rhythm.     Heart sounds: Normal heart sounds.  Pulmonary:     Effort: Pulmonary effort is normal.     Breath sounds: Normal breath sounds.  Musculoskeletal:     Cervical back: Normal range of motion.  Skin:    General: Skin is warm.  Neurological:     General: No focal deficit present.     Mental Status: She is alert.  Psychiatric:        Mood and Affect: Mood normal.        Behavior: Behavior normal.         Assessment And Plan:  Infectious gastroenteritis Assessment & Plan: Acute diarrhea with Vibrio, E. coli, and norovirus. Recent travel likely source. Norovirus self-limiting; Vibrio and E. coli require treatment. Rocephin  and doxycycline chosen to address bacterial components without worsening symptoms. ER records reviewed. - Administer Rocephin  injection. - Prescribe doxycycline. - Provide Zofran  for nausea, prefer orally disintegrating tablets if covered. - Advise dietary modifications: mashed/baked potatoes, toast. - Encourage hydration with Pedialyte or PowerAid. - Provide stool culture containers for future use if symptoms persist. - Significant nausea and vomiting, impairing food and fluid retention.  Zofran  selected for efficacy, preference for orally disintegrating tablets. - Prescribe Zofran , prefer orally disintegrating tablets if covered. - Recommend ginger chews or ginger tea.  Orders: -     [id number]  Dehydration Assessment & Plan: Suspected dehydration from diarrhea and vomiting. Emphasis on hydration and kidney function monitoring. - she does not wish to return to ER for IV fluids - Encourage increased fluid intake with Pedialyte or PowerAid. - Order blood tests for kidney function and electrolytes.   Acute cystitis with hematuria Assessment & Plan: UTI confirmed. Rocephin  and doxycycline chosen to treat UTI and gastrointestinal infections simultaneously. - Administer Rocephin  injection. - Prescribe doxycycline.  Orders: -     POCT URINALYSIS DIP (CLINITEK) -     Urine Culture -     cefTRIAXone  Sodium  Protein-calorie malnutrition, unspecified severity Assessment & Plan: I had a frank discussion with patient regarding continued use of GLP-1 therapy. She is advised to HOLD therapy for now until she is tolerating oral solids/liquids.  - Check prealbumin - consider Nutrition eval to optimize Nutrition - start MVI   Type 2 diabetes mellitus with stage 2 chronic kidney disease, with long-term current use of insulin  [facility]) Assessment & Plan: Managed with Tirzepatide  and Tresiba . Gastrointestinal issues require temporary cessation of Tirzepatide . Monitor glycemic control. - Temporarily discontinue Tirzepatide . - Consider adjusting Tresiba  dosage if necessary.   Other orders -     Ondansetron ; Take 1 tablet (4 mg total) by mouth every 8 (eight) hours as needed for nausea or vomiting.  Dispense: 20 tablet; Refill: 0 -     Doxycycline Hyclate; Take 1 tablet (100 mg total) by mouth 2 (two) times daily.  Dispense: 14 tablet; Refill: 0    Return if symptoms worsen or fail to improve.  Patient was given opportunity to ask questions. Patient verbalized understanding of the  plan and was able to repeat key elements of the plan. All questions were answered to their satisfaction.   I, Joyce LOISE Slocumb, MD, have reviewed all documentation for this visit. The documentation  on 07/31/24 for the exam, diagnosis, procedures, and orders are all accurate and complete.   IF YOU HAVE BEEN REFERRED TO A SPECIALIST, IT MAY TAKE 1-2 WEEKS TO SCHEDULE/PROCESS THE REFERRAL. IF YOU HAVE NOT HEARD FROM US /SPECIALIST IN TWO WEEKS, PLEASE GIVE US  A CALL AT [phone].

## 2024-08-01 ENCOUNTER — Encounter: Payer: Self-pay | Admitting: Internal Medicine

## 2024-08-01 DIAGNOSIS — E860 Dehydration: Secondary | ICD-10-CM | POA: Insufficient documentation

## 2024-08-01 DIAGNOSIS — N3001 Acute cystitis with hematuria: Secondary | ICD-10-CM | POA: Insufficient documentation

## 2024-08-01 DIAGNOSIS — A09 Infectious gastroenteritis and colitis, unspecified: Secondary | ICD-10-CM | POA: Insufficient documentation

## 2024-08-01 DIAGNOSIS — E46 Unspecified protein-calorie malnutrition: Secondary | ICD-10-CM | POA: Insufficient documentation

## 2024-08-01 LAB — CMP14+EGFR
ALT: 12 IU/L (ref 0–32)
AST: 34 IU/L (ref 0–40)
Albumin: 3.4 g/dL — ABNORMAL LOW (ref 3.9–4.9)
Alkaline Phosphatase: 103 IU/L (ref 41–116)
BUN/Creatinine Ratio: 19 (ref 9–23)
BUN: 42 mg/dL — ABNORMAL HIGH (ref 6–24)
Bilirubin Total: 0.6 mg/dL (ref 0.0–1.2)
CO2: 13 mmol/L — ABNORMAL LOW (ref 20–29)
Calcium: 7.8 mg/dL — ABNORMAL LOW (ref 8.7–10.2)
Chloride: 113 mmol/L — ABNORMAL HIGH (ref 96–106)
Creatinine, Ser: 2.21 mg/dL — ABNORMAL HIGH (ref 0.57–1.00)
Globulin, Total: 2.5 g/dL (ref 1.5–4.5)
Glucose: 69 mg/dL — ABNORMAL LOW (ref 70–99)
Potassium: 3.6 mmol/L (ref 3.5–5.2)
Sodium: 142 mmol/L (ref 134–144)
Total Protein: 5.9 g/dL — ABNORMAL LOW (ref 6.0–8.5)
eGFR: 27 mL/min/1.73 — ABNORMAL LOW (ref >59)

## 2024-08-01 MED ORDER — DOXYCYCLINE HYCLATE 100 MG PO TABS: 100.0000 mg | ORAL_TABLET | Freq: Two times a day (BID) | ORAL | 0 refills | Status: DC

## 2024-08-01 NOTE — Assessment & Plan Note (Signed)
 UTI confirmed. Rocephin  and doxycycline chosen to treat UTI and gastrointestinal infections simultaneously. - Administer Rocephin  injection. - Prescribe doxycycline.

## 2024-08-01 NOTE — Assessment & Plan Note (Signed)
 Suspected dehydration from diarrhea and vomiting. Emphasis on hydration and kidney function monitoring. - she does not wish to return to ER for IV fluids - Encourage increased fluid intake with Pedialyte or PowerAid. - Order blood tests for kidney function and electrolytes.

## 2024-08-01 NOTE — Assessment & Plan Note (Signed)
 I had a frank discussion with patient regarding continued use of GLP-1 therapy. She is advised to HOLD therapy for now until she is tolerating oral solids/liquids.  - Check prealbumin - consider Nutrition eval to optimize Nutrition - start MVI

## 2024-08-01 NOTE — Assessment & Plan Note (Signed)
 Managed with Tirzepatide  and Tresiba . Gastrointestinal issues require temporary cessation of Tirzepatide . Monitor glycemic control. - Temporarily discontinue Tirzepatide . - Consider adjusting Tresiba  dosage if necessary.

## 2024-08-01 NOTE — Assessment & Plan Note (Signed)
 Acute diarrhea with Vibrio, E. coli, and norovirus. Recent travel likely source. Norovirus self-limiting; Vibrio and E. coli require treatment. Rocephin  and doxycycline chosen to address bacterial components without worsening symptoms. ER records reviewed. - Administer Rocephin  injection. - Prescribe doxycycline. - Provide Zofran  for nausea, prefer orally disintegrating tablets if covered. - Advise dietary modifications: mashed/baked potatoes, toast. - Encourage hydration with Pedialyte or PowerAid. - Provide stool culture containers for future use if symptoms persist. - Significant nausea and vomiting, impairing food and fluid retention. Zofran  selected for efficacy, preference for orally disintegrating tablets. - Prescribe Zofran , prefer orally disintegrating tablets if covered. - Recommend ginger chews or ginger tea.

## 2024-08-02 ENCOUNTER — Inpatient Hospital Stay (HOSPITAL_COMMUNITY)
Admission: EM | Admit: 2024-08-02 | Discharge: 2024-08-04 | DRG: 683 | Disposition: A | Discharge status: Home / Self Care | Payer: Self-pay | Source: Ambulatory Visit | Attending: Internal Medicine | Admitting: Internal Medicine

## 2024-08-02 ENCOUNTER — Encounter (HOSPITAL_COMMUNITY): Payer: Self-pay | Admitting: Internal Medicine

## 2024-08-02 ENCOUNTER — Other Ambulatory Visit: Payer: Self-pay

## 2024-08-02 ENCOUNTER — Emergency Department (HOSPITAL_COMMUNITY): Payer: Self-pay

## 2024-08-02 ENCOUNTER — Telehealth: Payer: Self-pay

## 2024-08-02 DIAGNOSIS — Z91048 Other nonmedicinal substance allergy status: Secondary | ICD-10-CM

## 2024-08-02 DIAGNOSIS — Z794 Long term (current) use of insulin: Secondary | ICD-10-CM | POA: Diagnosis not present

## 2024-08-02 DIAGNOSIS — E1165 Type 2 diabetes mellitus with hyperglycemia: Secondary | ICD-10-CM | POA: Diagnosis present

## 2024-08-02 DIAGNOSIS — N179 Acute kidney failure, unspecified: Secondary | ICD-10-CM | POA: Diagnosis present

## 2024-08-02 DIAGNOSIS — D638 Anemia in other chronic diseases classified elsewhere: Secondary | ICD-10-CM | POA: Diagnosis present

## 2024-08-02 DIAGNOSIS — I10 Essential (primary) hypertension: Secondary | ICD-10-CM | POA: Diagnosis present

## 2024-08-02 DIAGNOSIS — E860 Dehydration: Secondary | ICD-10-CM | POA: Diagnosis present

## 2024-08-02 DIAGNOSIS — Z8261 Family history of arthritis: Secondary | ICD-10-CM

## 2024-08-02 DIAGNOSIS — Z833 Family history of diabetes mellitus: Secondary | ICD-10-CM | POA: Diagnosis not present

## 2024-08-02 DIAGNOSIS — Z1612 Extended spectrum beta lactamase (ESBL) resistance: Secondary | ICD-10-CM | POA: Diagnosis present

## 2024-08-02 DIAGNOSIS — B9620 Unspecified Escherichia coli [E. coli] as the cause of diseases classified elsewhere: Secondary | ICD-10-CM | POA: Diagnosis present

## 2024-08-02 DIAGNOSIS — Z79899 Other long term (current) drug therapy: Secondary | ICD-10-CM

## 2024-08-02 DIAGNOSIS — E872 Acidosis, unspecified: Secondary | ICD-10-CM | POA: Diagnosis present

## 2024-08-02 DIAGNOSIS — N390 Urinary tract infection, site not specified: Secondary | ICD-10-CM | POA: Diagnosis present

## 2024-08-02 DIAGNOSIS — E8721 Acute metabolic acidosis: Secondary | ICD-10-CM | POA: Diagnosis present

## 2024-08-02 DIAGNOSIS — Z8249 Family history of ischemic heart disease and other diseases of the circulatory system: Secondary | ICD-10-CM

## 2024-08-02 DIAGNOSIS — E876 Hypokalemia: Secondary | ICD-10-CM | POA: Diagnosis present

## 2024-08-02 DIAGNOSIS — E785 Hyperlipidemia, unspecified: Secondary | ICD-10-CM | POA: Diagnosis present

## 2024-08-02 DIAGNOSIS — E1122 Type 2 diabetes mellitus with diabetic chronic kidney disease: Secondary | ICD-10-CM

## 2024-08-02 LAB — BASIC METABOLIC PANEL WITH GFR
Anion gap: 14 (ref 5–15)
BUN: 50 mg/dL — ABNORMAL HIGH (ref 6–20)
CO2: 13 mmol/L — ABNORMAL LOW (ref 22–32)
Calcium: 8 mg/dL — ABNORMAL LOW (ref 8.9–10.3)
Chloride: 111 mmol/L (ref 98–111)
Creatinine, Ser: 2.52 mg/dL — ABNORMAL HIGH (ref 0.44–1.00)
GFR, Estimated: 23 mL/min — ABNORMAL LOW (ref >60)
Glucose, Bld: 226 mg/dL — ABNORMAL HIGH (ref 70–99)
Potassium: 3.3 mmol/L — ABNORMAL LOW (ref 3.5–5.1)
Sodium: 138 mmol/L (ref 135–145)

## 2024-08-02 LAB — URINALYSIS, ROUTINE W REFLEX MICROSCOPIC
Bilirubin Urine: NEGATIVE
Glucose, UA: NEGATIVE mg/dL
Ketones, ur: NEGATIVE mg/dL
Nitrite: NEGATIVE
Protein, ur: 100 mg/dL — AB
Specific Gravity, Urine: 1.012 (ref 1.005–1.030)
WBC, UA: >50 WBC/hpf (ref 0–5)
pH: 5 (ref 5.0–8.0)

## 2024-08-02 LAB — I-STAT CHEM 8, ED
BUN: 47 mg/dL — ABNORMAL HIGH (ref 6–20)
Calcium, Ion: 1.15 mmol/L (ref 1.15–1.40)
Chloride: 113 mmol/L — ABNORMAL HIGH (ref 98–111)
Creatinine, Ser: 2.6 mg/dL — ABNORMAL HIGH (ref 0.44–1.00)
Glucose, Bld: 209 mg/dL — ABNORMAL HIGH (ref 70–99)
HCT: 26 % — ABNORMAL LOW (ref 36.0–46.0)
Hemoglobin: 8.8 g/dL — ABNORMAL LOW (ref 12.0–15.0)
Potassium: 3.3 mmol/L — ABNORMAL LOW (ref 3.5–5.1)
Sodium: 141 mmol/L (ref 135–145)
TCO2: 14 mmol/L — ABNORMAL LOW (ref 22–32)

## 2024-08-02 LAB — CBC WITH DIFFERENTIAL/PLATELET
Abs Immature Granulocytes: 0.05 K/uL (ref 0.00–0.07)
Basophils Absolute: 0 K/uL (ref 0.0–0.1)
Basophils Relative: 0 %
Eosinophils Absolute: 0 K/uL (ref 0.0–0.5)
Eosinophils Relative: 0 %
HCT: 30.9 % — ABNORMAL LOW (ref 36.0–46.0)
Hemoglobin: 9.5 g/dL — ABNORMAL LOW (ref 12.0–15.0)
Immature Granulocytes: 1 %
Lymphocytes Relative: 5 %
Lymphs Abs: 0.4 K/uL — ABNORMAL LOW (ref 0.7–4.0)
MCH: 27.7 pg (ref 26.0–34.0)
MCHC: 30.7 g/dL (ref 30.0–36.0)
MCV: 90.1 fL (ref 80.0–100.0)
Monocytes Absolute: 0.7 K/uL (ref 0.1–1.0)
Monocytes Relative: 7 %
Neutro Abs: 7.8 K/uL — ABNORMAL HIGH (ref 1.7–7.7)
Neutrophils Relative %: 87 %
Platelets: 206 K/uL (ref 150–400)
RBC: 3.43 MIL/uL — ABNORMAL LOW (ref 3.87–5.11)
RDW: 13.9 % (ref 11.5–15.5)
Smear Review: NORMAL
WBC: 8.9 K/uL (ref 4.0–10.5)
nRBC: 0 % (ref 0.0–0.2)

## 2024-08-02 LAB — I-STAT CG4 LACTIC ACID, ED: Lactic Acid, Venous: 0.9 mmol/L (ref 0.5–1.9)

## 2024-08-02 LAB — GLUCOSE, CAPILLARY
Glucose-Capillary: 196 mg/dL — ABNORMAL HIGH (ref 70–99)
Glucose-Capillary: 210 mg/dL — ABNORMAL HIGH (ref 70–99)

## 2024-08-02 LAB — CBG MONITORING, ED: Glucose-Capillary: 191 mg/dL — ABNORMAL HIGH (ref 70–99)

## 2024-08-02 MED ORDER — ALBUTEROL SULFATE (2.5 MG/3ML) 0.083% IN NEBU: 2.5000 mg | INHALATION_SOLUTION | RESPIRATORY_TRACT | Status: DC | PRN

## 2024-08-02 MED ORDER — ACETAMINOPHEN 650 MG RE SUPP: 650.0000 mg | Freq: Four times a day (QID) | RECTAL | Status: DC | PRN

## 2024-08-02 MED ORDER — SENNOSIDES-DOCUSATE SODIUM 8.6-50 MG PO TABS: 1.0000 | ORAL_TABLET | Freq: Every evening | ORAL | Status: DC | PRN

## 2024-08-02 MED ORDER — SODIUM CHLORIDE 0.9 % IV SOLN
1.0000 g | Freq: Once | INTRAVENOUS | Status: AC
  Administered 2024-08-02: 1 g via INTRAVENOUS
  Filled 2024-08-02: qty 10

## 2024-08-02 MED ORDER — ONDANSETRON HCL 4 MG PO TABS: 4.0000 mg | ORAL_TABLET | Freq: Four times a day (QID) | ORAL | Status: DC | PRN

## 2024-08-02 MED ORDER — LACTATED RINGERS IV BOLUS
1000.0000 mL | Freq: Once | INTRAVENOUS | Status: AC
  Administered 2024-08-02: 1000 mL via INTRAVENOUS

## 2024-08-02 MED ORDER — ACETAMINOPHEN 325 MG PO TABS
650.0000 mg | ORAL_TABLET | Freq: Four times a day (QID) | ORAL | Status: DC | PRN
  Administered 2024-08-02: 650 mg via ORAL
  Filled 2024-08-02: qty 2

## 2024-08-02 MED ORDER — HEPARIN SODIUM (PORCINE) 5000 UNIT/ML IJ SOLN
5000.0000 [IU] | Freq: Three times a day (TID) | INTRAMUSCULAR | Status: DC
  Filled 2024-08-02 (×4): qty 1

## 2024-08-02 MED ORDER — INSULIN ASPART 100 UNIT/ML IJ SOLN
0.0000 [IU] | Freq: Every day | INTRAMUSCULAR | Status: DC
  Administered 2024-08-02: 2 [IU] via SUBCUTANEOUS
  Administered 2024-08-03: 4 [IU] via SUBCUTANEOUS
  Filled 2024-08-02: qty 0.05

## 2024-08-02 MED ORDER — ONDANSETRON HCL 4 MG/2ML IJ SOLN
4.0000 mg | Freq: Once | INTRAMUSCULAR | Status: AC
  Administered 2024-08-02: 4 mg via INTRAVENOUS
  Filled 2024-08-02: qty 2

## 2024-08-02 MED ORDER — MELATONIN 5 MG PO TABS
5.0000 mg | ORAL_TABLET | Freq: Every evening | ORAL | Status: DC | PRN
  Administered 2024-08-02: 5 mg via ORAL
  Filled 2024-08-02: qty 1

## 2024-08-02 MED ORDER — MYCOPHENOLATE MOFETIL 250 MG PO CAPS: 500.0000 mg | ORAL_CAPSULE | Freq: Every evening | ORAL | Status: DC

## 2024-08-02 MED ORDER — STERILE WATER FOR INJECTION IV SOLN
Freq: Once | INTRAVENOUS | Status: AC
  Filled 2024-08-02: qty 150

## 2024-08-02 MED ORDER — MYCOPHENOLATE MOFETIL 250 MG PO CAPS
500.0000 mg | ORAL_CAPSULE | Freq: Every morning | ORAL | Status: DC
  Administered 2024-08-03 – 2024-08-04 (×2): 500 mg via ORAL
  Filled 2024-08-02 (×2): qty 2

## 2024-08-02 MED ORDER — POTASSIUM CHLORIDE CRYS ER 20 MEQ PO TBCR
40.0000 meq | EXTENDED_RELEASE_TABLET | Freq: Once | ORAL | Status: AC
  Administered 2024-08-02: 40 meq via ORAL
  Filled 2024-08-02: qty 2

## 2024-08-02 MED ORDER — TACROLIMUS 1 MG PO CAPS
1.5000 mg | ORAL_CAPSULE | Freq: Two times a day (BID) | ORAL | Status: DC
  Administered 2024-08-02 – 2024-08-04 (×4): 1.5 mg via ORAL
  Filled 2024-08-02 (×4): qty 1

## 2024-08-02 MED ORDER — MYCOPHENOLATE MOFETIL 250 MG PO CAPS
750.0000 mg | ORAL_CAPSULE | Freq: Every evening | ORAL | Status: DC
  Administered 2024-08-02 – 2024-08-03 (×2): 750 mg via ORAL
  Filled 2024-08-02 (×2): qty 3

## 2024-08-02 MED ORDER — INSULIN ASPART 100 UNIT/ML IJ SOLN
0.0000 [IU] | Freq: Three times a day (TID) | INTRAMUSCULAR | Status: DC
  Administered 2024-08-02 (×2): 2 [IU] via SUBCUTANEOUS
  Administered 2024-08-03: 3 [IU] via SUBCUTANEOUS
  Administered 2024-08-03: 2 [IU] via SUBCUTANEOUS
  Administered 2024-08-04: 7 [IU] via SUBCUTANEOUS
  Administered 2024-08-04: 9 [IU] via SUBCUTANEOUS
  Filled 2024-08-02: qty 0.09

## 2024-08-02 MED ORDER — PREDNISONE 5 MG PO TABS
5.0000 mg | ORAL_TABLET | Freq: Every day | ORAL | Status: DC
  Administered 2024-08-03 – 2024-08-04 (×2): 5 mg via ORAL
  Filled 2024-08-02 (×2): qty 1

## 2024-08-02 MED ORDER — ONDANSETRON HCL 4 MG/2ML IJ SOLN: 4.0000 mg | Freq: Four times a day (QID) | INTRAMUSCULAR | Status: DC | PRN

## 2024-08-02 MED ORDER — SODIUM CHLORIDE 0.9 % IV SOLN
1.0000 g | INTRAVENOUS | Status: DC
  Administered 2024-08-03: 1 g via INTRAVENOUS
  Filled 2024-08-02 (×2): qty 10

## 2024-08-02 NOTE — Telephone Encounter (Signed)
 I called and spoke with patient I advised her that she needs to go to the ER ASAP due to her being severely dehydrated. She stated she passed out last night and she is feeling really weak and can not drive. I offered to call EMS for pt she declined and stated she will call someone to come pick her up. Patient verbalized understanding and stated she will go to the ER. BRONSON KRAFT

## 2024-08-02 NOTE — ED Provider Notes (Signed)
 [place] EMERGENCY DEPARTMENT AT [facility] Provider Note   CSN: [id number] Arrival date & time: 08/02/24  9098     Patient presents with: No chief complaint on file.   [name] is a 48 y.o. female.   [name] is a 48 y.o. female with a history of renal transplant in 2011, hypertension, diabetes, who presents to the emergency department for evaluation of severe dehydration.  Patient was initially seen in the ED on 1016 after she had been having diarrhea for about a week and a half which began during a trip to [place].  During that evaluation she had a slight increase in her creatinine but labs otherwise stable, had GI stool testing done that resulted after ED visit and showed no C. difficile but GI pathogen panel was positive for multiple organisms.  Unfortunately she was not able to see her primary care provider until 2 days ago regarding these results and in that time also started experiencing urinary frequency and discomfort with urination concerning for UTI.  She denies any fevers.  Primary care provider gave her shot of Rocephin  and started her on doxycycline on Monday.  Since then she reports that diarrhea has resolved but she is still experiencing urinary symptoms and has had ongoing nausea and vomiting.  She has become increasingly lightheaded due to dehydration and last night got up quickly in the bathroom and had a syncopal episode striking her head.  They took labs at her primary care visit and she was called today and told to come in due to worsening kidney function and concern for severe dehydration.  She denies any abdominal or flank pain.  She reports that her kidney disease is managed by Dr. Nathen at [facility], she has not missed any doses of her immunosuppressives.  The history is provided by the patient, a relative and medical records.       Prior to Admission medications   Medication Sig Start Date End Date Taking? Authorizing Provider   albuterol (VENTOLIN HFA) 108 (90 Base) MCG/ACT inhaler Inhale 1-2 puffs into the lungs every 4 (four) hours as needed for wheezing or shortness of breath. 07/07/21   [provider]  amLODipine  (NORVASC ) 10 MG tablet Take 10 mg by mouth every evening. 10/23/21   [provider]  azelastine (OPTIVAR) 0.05 % ophthalmic solution Place 2 drops into both eyes 2 (two) times daily as needed (allergies). 12/06/21   [provider]  BAQSIMI  ONE PACK 3 MG/DOSE POWD PLACE 3 MG INTO THE NOSE ONCE AS NEEDED FOR UP TO 1 DOSE. 06/14/22   Trixie File, MD  Cholecalciferol (VITAMIN D3) 125 MCG (5000 UT) CAPS Take 5,000 Units by mouth daily.    [provider]  Continuous Glucose Sensor (DEXCOM G7 SENSOR) MISC Change sensor every 10 days 09/21/23   Trixie File, MD  Continuous Glucose Sensor (DEXCOM G7 SENSOR) MISC Use to check glucose continuously, change sensor every 10 days 10/06/23   Trixie File, MD  Continuous Glucose Sensor (FREESTYLE LIBRE 3 PLUS SENSOR) MISC INJECT 1 DEVICE INTO THE SKIN CONTINUOUS. CHANGE EVERY 15 DAYS 10/11/23   Trixie File, MD  diphenhydrAMINE  (BENADRYL ) 25 MG tablet Take 50 mg by mouth every 6 (six) hours as needed for itching or allergies.    [provider]  doxycycline (VIBRA-TABS) 100 MG tablet Take 1 tablet (100 mg total) by mouth 2 (two) times daily. 08/01/24   Jarold Medici, MD  insulin  degludec (TRESIBA  FLEXTOUCH) 100 UNIT/ML FlexTouch  Pen INJECT 20 UNITS INTO THE SKIN DAILY 04/04/24   Trixie File, MD  Insulin  Pen Needle (BD PEN NEEDLE NANO 2ND GEN) 32G X 4 MM MISC USE 3X A DAY 03/24/23   Trixie File, MD  levonorgestrel (MIRENA) 20 MCG/24HR IUD by Intrauterine route.    [provider]  LORazepam (ATIVAN) 1 MG tablet Take 1 mg by mouth every 6 (six) hours as needed for anxiety. 11/27/21   [provider]  mycophenolate  (CELLCEPT ) 250 MG capsule Take 500-750 mg by mouth See admin instructions. 500  mg every morning  750 mg every evening    [provider]  ondansetron  (ZOFRAN -ODT) 4 MG disintegrating tablet Take 1 tablet (4 mg total) by mouth every 8 (eight) hours as needed for nausea or vomiting. 07/31/24   Jarold Medici, MD  predniSONE  (DELTASONE ) 5 MG tablet Take 5 mg by mouth daily.    [provider]  tacrolimus  (PROGRAF ) 0.5 MG capsule Take 2.5-3 mg by mouth See admin instructions. 2.5 mg every morning  3 mg every evening 01/25/19   [provider]  tirzepatide  (MOUNJARO ) 5 MG/0.5ML Pen Inject under skin 5 mg every other week 02/08/24   Trixie File, MD  traZODone (DESYREL) 50 MG tablet Take 50-100 mg by mouth at bedtime as needed for sleep. 11/27/21   [provider]  trimethoprim (TRIMPEX) 100 MG tablet Take 100 mg by mouth daily. 12/06/21   [provider]    Allergies: Adhesive [tape], Strawberry (diagnostic), and Wound dressing adhesive    Review of Systems  Constitutional:  Negative for chills and fever.  Respiratory:  Negative for cough and shortness of breath.   Cardiovascular:  Negative for chest pain.  Gastrointestinal:  Positive for diarrhea, nausea and vomiting.  Genitourinary:  Positive for dysuria and frequency. Negative for flank pain, vaginal bleeding and vaginal discharge.  All other systems reviewed and are negative.   Updated Vital Signs BP 112/69   Pulse 96   Temp 98.5 F (36.9 C) (Oral)   Resp (!) 24   SpO2 100%   Physical Exam Vitals and nursing note reviewed.  Constitutional:      General: She is not in acute distress.    Appearance: Normal appearance. She is well-developed. She is ill-appearing. She is not diaphoretic.     Comments: Patient is, appears weak and somewhat ill but not in any acute distress  HENT:     Head: Normocephalic and atraumatic.     Mouth/Throat:     Mouth: Mucous membranes are dry.  Eyes:     General:        Right eye: No discharge.        Left eye: No discharge.   Cardiovascular:     Rate and Rhythm: Normal rate and regular rhythm.     Pulses: Normal pulses.     Heart sounds: Normal heart sounds.  Pulmonary:     Effort: Pulmonary effort is normal. No respiratory distress.     Breath sounds: Normal breath sounds. No wheezing or rales.     Comments: Respirations equal and unlabored, patient able to speak in full sentences, lungs clear to auscultation bilaterally  Abdominal:     General: Bowel sounds are normal. There is no distension.     Palpations: Abdomen is soft. There is no mass.     Tenderness: There is no abdominal tenderness. There is no guarding.     Comments: Abdomen soft, nondistended, nontender to palpation in all quadrants without guarding  or peritoneal signs  Musculoskeletal:        General: No deformity.     Cervical back: Neck supple.  Skin:    General: Skin is warm and dry.     Capillary Refill: Capillary refill takes less than 2 seconds.  Neurological:     Mental Status: She is alert and oriented to person, place, and time.     Coordination: Coordination normal.     Comments: Speech is clear, able to follow commands Moves extremities without ataxia, coordination intact  Psychiatric:        Mood and Affect: Mood normal.        Behavior: Behavior normal.     (all labs ordered are listed, but only abnormal results are displayed) Labs Reviewed  URINE CULTURE - Abnormal; Notable for the following components:      Result Value   Culture   (*)    Value: <10,000 COLONIES/mL INSIGNIFICANT GROWTH Performed at [facility], [address] [place], [place] [address]    All other components within normal limits  URINALYSIS, ROUTINE W REFLEX MICROSCOPIC - Abnormal; Notable for the following components:   APPearance CLOUDY (*)    Hgb urine dipstick MODERATE (*)    Protein, ur 100 (*)    Leukocytes,Ua LARGE (*)    Bacteria, UA RARE (*)    All other components within normal limits  BASIC METABOLIC PANEL WITH GFR -  Abnormal; Notable for the following components:   Potassium 3.3 (*)    CO2 13 (*)    Glucose, Bld 226 (*)    BUN 50 (*)    Creatinine, Ser 2.52 (*)    Calcium 8.0 (*)    GFR, Estimated 23 (*)    All other components within normal limits  CBC WITH DIFFERENTIAL/PLATELET - Abnormal; Notable for the following components:   RBC 3.43 (*)    Hemoglobin 9.5 (*)    HCT 30.9 (*)    Neutro Abs 7.8 (*)    Lymphs Abs 0.4 (*)    All other components within normal limits  GLUCOSE, CAPILLARY - Abnormal; Notable for the following components:   Glucose-Capillary 196 (*)    All other components within normal limits  HEMOGLOBIN A1C - Abnormal; Notable for the following components:   Hgb A1c MFr Bld 6.2 (*)    All other components within normal limits  CBC - Abnormal; Notable for the following components:   RBC 2.80 (*)    Hemoglobin 7.8 (*)    HCT 24.8 (*)    All other components within normal limits  COMPREHENSIVE METABOLIC PANEL WITH GFR - Abnormal; Notable for the following components:   Potassium 3.4 (*)    CO2 19 (*)    BUN 47 (*)    Creatinine, Ser 2.47 (*)    Calcium 7.7 (*)    Total Protein 5.1 (*)    Albumin 2.5 (*)    GFR, Estimated 23 (*)    All other components within normal limits  GLUCOSE, CAPILLARY - Abnormal; Notable for the following components:   Glucose-Capillary 210 (*)    All other components within normal limits  GLUCOSE, CAPILLARY - Abnormal; Notable for the following components:   Glucose-Capillary 154 (*)    All other components within normal limits  GLUCOSE, CAPILLARY - Abnormal; Notable for the following components:   Glucose-Capillary 233 (*)    All other components within normal limits  CBC WITH DIFFERENTIAL/PLATELET - Abnormal; Notable for the following components:  RBC 2.58 (*)    Hemoglobin 7.4 (*)    HCT 22.9 (*)    Lymphs Abs 0.4 (*)    All other components within normal limits  BASIC METABOLIC PANEL WITH GFR - Abnormal; Notable for the following  components:   Glucose, Bld 436 (*)    BUN 55 (*)    Creatinine, Ser 2.36 (*)    Calcium 7.1 (*)    GFR, Estimated 25 (*)    All other components within normal limits  MAGNESIUM - Abnormal; Notable for the following components:   Magnesium 1.4 (*)    All other components within normal limits  GLUCOSE, CAPILLARY - Abnormal; Notable for the following components:   Glucose-Capillary 323 (*)    All other components within normal limits  GLUCOSE, CAPILLARY - Abnormal; Notable for the following components:   Glucose-Capillary 379 (*)    All other components within normal limits  GLUCOSE, CAPILLARY - Abnormal; Notable for the following components:   Glucose-Capillary 304 (*)    All other components within normal limits  I-STAT CHEM 8, ED - Abnormal; Notable for the following components:   Potassium 3.3 (*)    Chloride 113 (*)    BUN 47 (*)    Creatinine, Ser 2.60 (*)    Glucose, Bld 209 (*)    TCO2 14 (*)    Hemoglobin 8.8 (*)    HCT 26.0 (*)    All other components within normal limits  CBG MONITORING, ED - Abnormal; Notable for the following components:   Glucose-Capillary 191 (*)    All other components within normal limits  CULTURE, BLOOD (ROUTINE X 2)  CULTURE, BLOOD (ROUTINE X 2)  HIV ANTIBODY (ROUTINE TESTING W REFLEX)  GLUCOSE, CAPILLARY  I-STAT CG4 LACTIC ACID, ED    EKG: EKG Interpretation Date/Time:  Thursday August 02 2024 09:39:25 EDT Ventricular Rate:  90 PR Interval:  136 QRS Duration:  69 QT Interval:  295 QTC Calculation: 361 R Axis:   73  Text Interpretation: Sinus rhythm no acute ST/T changes Baseline wander Confirmed by Freddi Hamilton [phone]) on 08/02/2024 9:53:44 AM  Radiology: CT Head Wo Contrast Result Date: 08/02/2024 EXAM: CT HEAD WITHOUT CONTRAST 08/02/2024 10:30:39 AM TECHNIQUE: CT of the head was performed without the administration of intravenous contrast. Automated exposure control, iterative reconstruction, and/or weight based adjustment  of the mA/kV was utilized to reduce the radiation dose to as low as reasonably achievable. COMPARISON: CT of the head dated 01/25/2004. CLINICAL HISTORY: Head trauma, moderate-severe; syncope and hit head. FINDINGS: BRAIN AND VENTRICLES: No acute hemorrhage. No evidence of acute infarct. No hydrocephalus. No extra-axial collection. No mass effect or midline shift. Atherosclerotic calcifications involving the large vessels of the skull base. ORBITS: No acute abnormality. SINUSES: Mucosal thickening throughout the paranasal sinuses. SOFT TISSUES AND SKULL: No acute soft tissue abnormality. No skull fracture. IMPRESSION: 1. No acute intracranial abnormality. 2. Mucosal thickening throughout the paranasal sinuses. Electronically signed by: Evalene Coho MD 08/02/2024 10:33 AM EDT RP Workstation: [id number]   CT ABDOMEN PELVIS WO CONTRAST Result Date: 07/19/2024 CLINICAL DATA:  Diarrhea Diarrhea, immunocompromised, assess for colitis EXAM: CT ABDOMEN AND PELVIS WITHOUT CONTRAST TECHNIQUE: Multidetector CT imaging of the abdomen and pelvis was performed following the standard protocol without IV contrast. RADIATION DOSE REDUCTION: This exam was performed according to the departmental dose-optimization program which includes automated exposure control, adjustment of the mA and/or kV according to patient size and/or use of iterative reconstruction technique. COMPARISON:  12/21/2021 FINDINGS: Lower chest:  No acute findings extensive coronary artery calcifications, advanced for patient's age. No effusions. Hepatobiliary: No focal hepatic abnormality. Gallbladder unremarkable. Pancreas: No focal abnormality or ductal dilatation. Spleen: No focal abnormality.  Normal size. Adrenals/Urinary Tract: Adrenal glands normal. 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 No hydronephrosis is crash that no hydronephrosis. Right lower quadrant renal transplant noted also without  hydronephrosis. Small low-density lesion measures 1.5 cm compared to 1.1 cm previously. This most likely reflects a small cyst. No follow-up imaging recommended. Urinary bladder unremarkable. Stomach/Bowel: Fluid throughout the colon with air-fluid levels which may reflect a diarrheal condition. No areas of wall thickening or surrounding inflammatory change to suggest colitis. Stomach and small bowel decompressed. Vascular/Lymphatic: Diffuse aortoiliac and branch vessel atherosclerosis. No evidence of aneurysm or adenopathy. Reproductive: Uterus and adnexa unremarkable. No mass. IUD in the uterus. Other: No free fluid or free air. Musculoskeletal: No acute bony abnormality. IMPRESSION: No evidence of colitis. Fluid throughout the colon with air-fluid levels may reflect diarrhea. Diffuse aortic/arterial atherosclerosis, including coronary artery disease. Right lower quadrant renal transplant grossly unremarkable. Electronically Signed   By: Franky Crease M.D.   On: 07/19/2024 17:21      Procedures   Medications Ordered in the ED  lactated ringers  bolus 1,000 mL (1,000 mLs Intravenous New Bag/Given 08/02/24 1048)  ondansetron  (ZOFRAN ) injection 4 mg (4 mg Intravenous Given 08/02/24 1048)  cefTRIAXone  (ROCEPHIN ) 1 g in sodium chloride  0.9 % 100 mL IVPB (0 g Intravenous Stopped 08/02/24 1222)  lactated ringers  bolus 1,000 mL (0 mLs Intravenous Stopped 08/02/24 1248)  potassium chloride SA (KLOR-CON M) CR tablet 40 mEq (40 mEq Oral Given 08/02/24 1242)  sodium bicarbonate 150 mEq in sterile water 1,150 mL infusion ( Intravenous Stopped 08/02/24 2203)  potassium chloride SA (KLOR-CON M) CR tablet 40 mEq (40 mEq Oral Given 08/03/24 0852)  magnesium sulfate IVPB 4 g 100 mL (0 g Intravenous Stopped 08/04/24 1201)  fosfomycin (MONUROL) packet 3 g (3 g Oral Given 08/04/24 1006)                                    Medical Decision Making Amount and/or Complexity of Data Reviewed Labs: ordered. Radiology:  ordered.  Risk Prescription drug management. Decision regarding hospitalization.   48 y.o. female presents to the ED with complaints of fatigue, weakness and dehydration in the setting of vomiting, diarrhea and UTI, this involves an extensive number of treatment options, and is a complaint that carries with it a high risk of complications and morbidity.  The differential diagnosis includes AKI with renal transplant, electrolyte derangements, pyelonephritis, sepsis  On arrival pt is nontoxic, vitals are fortunately within normal limits.  Additional history obtained from family at bedside. Previous records obtained and reviewed   I ordered IV fluid bolus and IV rocephin  for dehydration and UTI  Lab Tests:  I Ordered, reviewed, and interpreted labs, which included: No leukocytosis, hemoglobin of 9.5 which is at baseline creatinine unfortunately has increased to 2.5, baseline of 1.1-1.4 with mild hypokalemia as well.  UA still showing signs of infection with large leukocytes, greater than 50 WBCs, white blood cell clumps and rare bacteria present.  Started on antibiotics.   Imaging Studies ordered:  I ordered imaging studies which included CT head, I independently visualized and interpreted imaging which showed no intracranial bleeding or skull fracture  ED Course:   Patient with AKI in  the setting of vomiting and diarrhea, fortunately diarrhea has now resolved but continuing to have nausea and has a UTI.  Kidney function increased from baseline and renal transplant.  Would benefit from continued IV hydration and close monitoring of renal function. Case discussed with Dr. Cheryle with [facility] hospitalist who will see and admit the patient     Portions of this note were generated with Dragon dictation software. Dictation errors may occur despite best attempts at proofreading.      Final diagnoses:  AKI (acute kidney injury)  Dehydration  Acute UTI    ED Discharge Orders     None           Alva Larraine JULIANNA DEVONNA 08/08/24 1132    Freddi Hamilton, MD 08/09/24 [phone]

## 2024-08-02 NOTE — ED Triage Notes (Signed)
 Seen by PCP on Tuesday with concern for UTI. Prescriptions sent in this morning for abx. Called by PCP who endorsed concern for dehydration due to patients lab work from Tuesday, near syncopal episode last night, and hallucinations. No fevers at home.

## 2024-08-02 NOTE — Progress Notes (Signed)
 Patient refused Heparin  shot, MD is aware of it.

## 2024-08-02 NOTE — Plan of Care (Signed)

## 2024-08-02 NOTE — Plan of Care (Signed)
  Problem: Education: Goal: Ability to describe self-care measures that may prevent or decrease complications (Diabetes Survival Skills Education) will improve Outcome: Progressing   Problem: Coping: Goal: Ability to adjust to condition or change in health will improve Outcome: Progressing   Problem: Fluid Volume: Goal: Ability to maintain a balanced intake and output will improve Outcome: Progressing   Problem: Metabolic: Goal: Ability to maintain appropriate glucose levels will improve Outcome: Progressing   Problem: Nutritional: Goal: Maintenance of adequate nutrition will improve Outcome: Progressing Goal: Progress toward achieving an optimal weight will improve Outcome: Progressing   Problem: Tissue Perfusion: Goal: Adequacy of tissue perfusion will improve Outcome: Progressing   Problem: Education: Goal: Knowledge of General Education information will improve Description: Including pain rating scale, medication(s)/side effects and non-pharmacologic comfort measures Outcome: Progressing   Problem: Nutrition: Goal: Adequate nutrition will be maintained Outcome: Progressing

## 2024-08-02 NOTE — H&P (Signed)
 History and Physical    MARIANNA CID FMW:[id number] DOB: Aug 27, 1976 DOA: 08/02/2024  PCP: Jarold Medici, MD   Patient coming from: Home  I have personally briefly reviewed patient's old medical records in [facility]  Chief Complaint: Weakness  HPI: [name] is a 48 y.o. female with medical history significant of diabetes mellitus type 2, hypertension, hyperlipidemia, renal failure with history of renal transplant in 2011 currently being followed by Dr. Bernell kidney presents with not feeling well.  Patient complains of urinary frequency and urgency and feels that she might be having urinary tract infection.  She was seen at her PCPs office on 07/31/2024 and UA was positive for UTI and she was given a dose of IM Rocephin  and advised to take oral doxycycline.  Subsequently she continued to feel weak with intermittent vomiting and some diarrhea.  She almost passed out last night.  No fever, worsening abdominal pain, seizures, cough, shortness of breath, chest pain, hematuria reported.  Patient recently traveled from October 3 to October 14 to [place] and had diarrhea almost since the beginning of the trip till recently.  [place] the [place] , she was seen in the emergency room: Stool for C. difficile was negative.  She was later informed that she was positive for vibrio and enteroaggregated E. coli/enteropathogenic E. coli/enterotoxigenic E. coli and norovirus and stool testing done on 07/19/2024.  Her diarrhea is finally improving and has almost resolved.  ED Course: She was found to have potassium of 3.3, bicarb of 13, creatinine of 2.52.  WBC of 8.9.  She was started on iv antibiotics and fluids.  CT of the head was negative for any acute intracranial abnormity. Hospitalist service was called to evaluate the patient.  Review of Systems: As per HPI otherwise all other systems were reviewed and are negative.    Past Medical History:  Diagnosis Date   Arthritis     Diabetes mellitus    HTN (hypertension)    Hyperlipidemia    Pyelonephritis 09/30/11   Renal failure    S/P kidney transplant     Past Surgical History:  Procedure Laterality Date   CARPAL TUNNEL RELEASE Right 11/02/2018   Procedure: RIGHT CARPAL TUNNEL RELEASE;  Surgeon: Murrell Drivers, MD;  Location: [facility];  Service: Orthopedics;  Laterality: Right;   CESAREAN SECTION  10/04/1994   KIDNEY TRANSPLANT  03/04/2010   right   loop av graft  05/04/2009   left upper arm   PERITONEAL CATHETER INSERTION  ~ 2003   REDUCTION MAMMAPLASTY Bilateral 2023   breast lift   TUBAL LIGATION  10/04/1998   ULNAR NERVE TRANSPOSITION Right 11/02/2018   Procedure: ULNAR NERVE DECOMPRESSION;  Surgeon: Murrell Drivers, MD;  Location:  SURGERY CENTER;  Service: Orthopedics;  Laterality: Right;     reports that she has never smoked. She has never used smokeless tobacco. She reports that she does not drink alcohol and does not use drugs.  Allergies  Allergen Reactions   Adhesive [Tape] Other (See Comments)    plastic tape irritates skin   Ok with paper tape   Strawberry (Diagnostic)    Wound Dressing Adhesive     Other reaction(s): Other plastic tape irritates skin   Ok with paper tape    Family History  Problem Relation Age of Onset   Diabetes Mother    Arthritis Mother    Arthritis Father    Prostate cancer Father    Hypertension Father  Breast cancer Maternal Aunt    Breast cancer Paternal Aunt     Prior to Admission medications   Medication Sig Start Date End Date Taking? Authorizing Provider  albuterol (VENTOLIN HFA) 108 (90 Base) MCG/ACT inhaler Inhale 1-2 puffs into the lungs every 4 (four) hours as needed for wheezing or shortness of breath. 07/07/21   [provider]  amLODipine  (NORVASC ) 10 MG tablet Take 10 mg by mouth every evening. 10/23/21   [provider]  azelastine (OPTIVAR) 0.05 % ophthalmic solution Place 2 drops into both  eyes 2 (two) times daily as needed (allergies). 12/06/21   [provider]  BAQSIMI  ONE PACK 3 MG/DOSE POWD PLACE 3 MG INTO THE NOSE ONCE AS NEEDED FOR UP TO 1 DOSE. 06/14/22   Trixie File, MD  Cholecalciferol (VITAMIN D3) 125 MCG (5000 UT) CAPS Take 5,000 Units by mouth daily.    [provider]  Continuous Glucose Sensor (DEXCOM G7 SENSOR) MISC Change sensor every 10 days 09/21/23   Trixie File, MD  Continuous Glucose Sensor (DEXCOM G7 SENSOR) MISC Use to check glucose continuously, change sensor every 10 days 10/06/23   Trixie File, MD  Continuous Glucose Sensor (FREESTYLE LIBRE 3 PLUS SENSOR) MISC INJECT 1 DEVICE INTO THE SKIN CONTINUOUS. CHANGE EVERY 15 DAYS 10/11/23   Trixie File, MD  diphenhydrAMINE  (BENADRYL ) 25 MG tablet Take 50 mg by mouth every 6 (six) hours as needed for itching or allergies.    [provider]  doxycycline (VIBRA-TABS) 100 MG tablet Take 1 tablet (100 mg total) by mouth 2 (two) times daily. 08/01/24   Jarold Medici, MD  insulin  degludec (TRESIBA  FLEXTOUCH) 100 UNIT/ML FlexTouch Pen INJECT 20 UNITS INTO THE SKIN DAILY 04/04/24   Trixie File, MD  Insulin  Pen Needle (BD PEN NEEDLE NANO 2ND GEN) 32G X 4 MM MISC USE 3X A DAY 03/24/23   Trixie File, MD  levonorgestrel (MIRENA) 20 MCG/24HR IUD by Intrauterine route.    [provider]  LORazepam (ATIVAN) 1 MG tablet Take 1 mg by mouth every 6 (six) hours as needed for anxiety. 11/27/21   [provider]  mycophenolate  (CELLCEPT ) 250 MG capsule Take 500-750 mg by mouth See admin instructions. 500 mg every morning  750 mg every evening    [provider]  ondansetron  (ZOFRAN -ODT) 4 MG disintegrating tablet Take 1 tablet (4 mg total) by mouth every 8 (eight) hours as needed for nausea or vomiting. 07/31/24   Jarold Medici, MD  predniSONE  (DELTASONE ) 5 MG tablet Take 5 mg by mouth daily.    [provider]  tacrolimus  (PROGRAF ) 0.5 MG capsule  Take 2.5-3 mg by mouth See admin instructions. 2.5 mg every morning  3 mg every evening 01/25/19   [provider]  tirzepatide  (MOUNJARO ) 5 MG/0.5ML Pen Inject under skin 5 mg every other week 02/08/24   Trixie File, MD  traZODone (DESYREL) 50 MG tablet Take 50-100 mg by mouth at bedtime as needed for sleep. 11/27/21   [provider]  trimethoprim (TRIMPEX) 100 MG tablet Take 100 mg by mouth daily. 12/06/21   [provider]    Physical Exam: Vitals:   08/02/24 0907 08/02/24 1149  BP: 112/69   Pulse: 96   Resp: (!) 24   Temp: 98.5 F (36.9 C) 98.9 F (37.2 C)  TempSrc: Oral Oral  SpO2: 100%     Constitutional: NAD, calm, comfortable Vitals:   08/02/24 0907 08/02/24 1149  BP: 112/69   Pulse: 96  Resp: (!) 24   Temp: 98.5 F (36.9 C) 98.9 F (37.2 C)  TempSrc: Oral Oral  SpO2: 100%    Eyes: PERRL, lids and conjunctivae normal ENMT: Mucous membranes are moist. Posterior pharynx clear of any exudate or lesions. Neck: normal, supple, no masses, no thyromegaly Respiratory: bilateral decreased breath sounds at bases, no wheezing, no crackles. Normal respiratory effort. No accessory muscle use.  Cardiovascular: S1 S2 positive, rate controlled. No extremity edema. 2+ pedal pulses.  Abdomen: no tenderness, no masses palpated. No hepatosplenomegaly. Bowel sounds positive.  Musculoskeletal: no clubbing / cyanosis. No joint deformity upper and lower extremities.  Skin: no rashes, lesions, ulcers. No induration Neurologic: CN 2-12 grossly intact. Moving extremities. No focal neurologic deficits.  Psychiatric: Normal judgment and insight. Alert and oriented x 3. Normal mood.    Labs on Admission: I have personally reviewed following labs and imaging studies  CBC: Recent Labs  Lab 08/02/24 0945 08/02/24 1120  WBC 8.9  --   NEUTROABS 7.8*  --   HGB 9.5* 8.8*  HCT 30.9* 26.0*  MCV 90.1  --   PLT 206  --    Basic Metabolic Panel: Recent Labs  Lab  07/31/24 1712 08/02/24 0945 08/02/24 1120  NA 142 138 141  K 3.6 3.3* 3.3*  CL 113* 111 113*  CO2 13* 13*  --   GLUCOSE 69* 226* 209*  BUN 42* 50* 47*  CREATININE 2.21* 2.52* 2.60*  CALCIUM 7.8* 8.0*  --    GFR: Estimated Creatinine Clearance: 19.9 mL/min (A) (by C-G formula based on SCr of 2.6 mg/dL (H)). Liver Function Tests: Recent Labs  Lab 07/31/24 1712  AST 34  ALT 12  ALKPHOS 103  BILITOT 0.6  PROT 5.9*  ALBUMIN 3.4*   No results for input(s): LIPASE, AMYLASE in the last 168 hours. No results for input(s): AMMONIA in the last 168 hours. Coagulation Profile: No results for input(s): INR, PROTIME in the last 168 hours. Cardiac Enzymes: No results for input(s): CKTOTAL, CKMB, CKMBINDEX, TROPONINI in the last 168 hours. BNP (last 3 results) No results for input(s): PROBNP in the last 8760 hours. HbA1C: No results for input(s): HGBA1C in the last 72 hours. CBG: No results for input(s): GLUCAP in the last 168 hours. Lipid Profile: No results for input(s): CHOL, HDL, LDLCALC, TRIG, CHOLHDL, LDLDIRECT in the last 72 hours. Thyroid  Function Tests: No results for input(s): TSH, T4TOTAL, FREET4, T3FREE, THYROIDAB in the last 72 hours. Anemia Panel: No results for input(s): VITAMINB12, FOLATE, FERRITIN, TIBC, IRON, RETICCTPCT in the last 72 hours. Urine analysis:    Component Value Date/Time   COLORURINE YELLOW 08/02/2024 0940   APPEARANCEUR CLOUDY (A) 08/02/2024 0940   LABSPEC 1.012 08/02/2024 0940   PHURINE 5.0 08/02/2024 0940   GLUCOSEU NEGATIVE 08/02/2024 0940   HGBUR MODERATE (A) 08/02/2024 0940   BILIRUBINUR NEGATIVE 08/02/2024 0940   BILIRUBINUR negative 07/31/2024 1643   BILIRUBINUR Negative 01/10/2023 0831   KETONESUR NEGATIVE 08/02/2024 0940   PROTEINUR 100 (A) 08/02/2024 0940   UROBILINOGEN 0.2 07/31/2024 1643   UROBILINOGEN 1.0 09/30/2011 0825   NITRITE NEGATIVE 08/02/2024 0940   LEUKOCYTESUR  LARGE (A) 08/02/2024 0940    Radiological Exams on Admission: CT Head Wo Contrast Result Date: 08/02/2024 EXAM: CT HEAD WITHOUT CONTRAST 08/02/2024 10:30:39 AM TECHNIQUE: CT of the head was performed without the administration of intravenous contrast. Automated exposure control, iterative reconstruction, and/or weight based adjustment of the mA/kV was utilized to reduce the radiation dose to as low as  reasonably achievable. COMPARISON: CT of the head dated 01/25/2004. CLINICAL HISTORY: Head trauma, moderate-severe; syncope and hit head. FINDINGS: BRAIN AND VENTRICLES: No acute hemorrhage. No evidence of acute infarct. No hydrocephalus. No extra-axial collection. No mass effect or midline shift. Atherosclerotic calcifications involving the large vessels of the skull base. ORBITS: No acute abnormality. SINUSES: Mucosal thickening throughout the paranasal sinuses. SOFT TISSUES AND SKULL: No acute soft tissue abnormality. No skull fracture. IMPRESSION: 1. No acute intracranial abnormality. 2. Mucosal thickening throughout the paranasal sinuses. Electronically signed by: Evalene Coho MD 08/02/2024 10:33 AM EDT RP Workstation: [id number]    Assessment/Plan  Possible UTI: Present on admission - Continue Rocephin .  Follow blood and urine cultures.  Acute kidney injury Acute metabolic acidosis Dehydration - Presented with creatinine of 2.52 and 2.6.  Possibly prerenal from dehydration.  Treated with IV fluids in the ED.  Advised ED provider to start bicarb drip.  Repeat a.m. labs.  Hypokalemia--replace.  Repeat a.m. labs  Diabetes mellitus type 2 with hyperglycemia -A1c in AM.  CBGs with SSI.  Carb modified diet.  Anemia of chronic disease - From chronic illnesses.  Hemoglobin stable.  Monitor intermittently  History of renal transplantation - Recently followed by Dr. Bernell kidney.  Resume immunosuppressants once home regimen is verified  Hypertension - Monitor blood pressure.   Currently on the lower side.  Will hold antihypertensives for now    DVT prophylaxis: Subcutaneous heparin  Code Status: Full Family Communication: Sister at bedside Disposition Plan: Home in 2 to 3 days once clinically improved Consults called: None Admission status: Telemetry/inpatient  Severity of Illness: The appropriate patient status for this patient is INPATIENT. Inpatient status is judged to be reasonable and necessary in order to provide the required intensity of service to ensure the patient's safety. The patient's presenting symptoms, physical exam findings, and initial radiographic and laboratory data in the context of their chronic comorbidities is felt to place them at high risk for further clinical deterioration. Furthermore, it is not anticipated that the patient will be medically stable for discharge from the hospital within 2 midnights of admission.   * I certify that at the point of admission it is my clinical judgment that the patient will require inpatient hospital care spanning beyond 2 midnights from the point of admission due to high intensity of service, high risk for further deterioration and high frequency of surveillance required.DEWAINE Sophie Mao MD [facility]  08/02/2024, 11:53 AM

## 2024-08-02 NOTE — Progress Notes (Signed)
 Chaplains received a spiritual care consult to assist [name] with advance directives.  She was not feeling up to talking at the moment, but I provided her with paperwork and will plan to follow up to answer any questions she may have.

## 2024-08-03 LAB — CBC
HCT: 24.8 % — ABNORMAL LOW (ref 36.0–46.0)
Hemoglobin: 7.8 g/dL — ABNORMAL LOW (ref 12.0–15.0)
MCH: 27.9 pg (ref 26.0–34.0)
MCHC: 31.5 g/dL (ref 30.0–36.0)
MCV: 88.6 fL (ref 80.0–100.0)
Platelets: 174 K/uL (ref 150–400)
RBC: 2.8 MIL/uL — ABNORMAL LOW (ref 3.87–5.11)
RDW: 13.7 % (ref 11.5–15.5)
WBC: 6.9 K/uL (ref 4.0–10.5)
nRBC: 0 % (ref 0.0–0.2)

## 2024-08-03 LAB — COMPREHENSIVE METABOLIC PANEL WITH GFR
ALT: 12 U/L (ref 0–44)
AST: 18 U/L (ref 15–41)
Albumin: 2.5 g/dL — ABNORMAL LOW (ref 3.5–5.0)
Alkaline Phosphatase: 63 U/L (ref 38–126)
Anion gap: 11 (ref 5–15)
BUN: 47 mg/dL — ABNORMAL HIGH (ref 6–20)
CO2: 19 mmol/L — ABNORMAL LOW (ref 22–32)
Calcium: 7.7 mg/dL — ABNORMAL LOW (ref 8.9–10.3)
Chloride: 111 mmol/L (ref 98–111)
Creatinine, Ser: 2.47 mg/dL — ABNORMAL HIGH (ref 0.44–1.00)
GFR, Estimated: 23 mL/min — ABNORMAL LOW (ref >60)
Glucose, Bld: 92 mg/dL (ref 70–99)
Potassium: 3.4 mmol/L — ABNORMAL LOW (ref 3.5–5.1)
Sodium: 141 mmol/L (ref 135–145)
Total Bilirubin: 0.7 mg/dL (ref 0.0–1.2)
Total Protein: 5.1 g/dL — ABNORMAL LOW (ref 6.5–8.1)

## 2024-08-03 LAB — GLUCOSE, CAPILLARY
Glucose-Capillary: 83 mg/dL (ref 70–99)
Glucose-Capillary: 154 mg/dL — ABNORMAL HIGH (ref 70–99)
Glucose-Capillary: 233 mg/dL — ABNORMAL HIGH (ref 70–99)
Glucose-Capillary: 323 mg/dL — ABNORMAL HIGH (ref 70–99)

## 2024-08-03 LAB — HIV ANTIBODY (ROUTINE TESTING W REFLEX): HIV Screen 4th Generation wRfx: NONREACTIVE

## 2024-08-03 LAB — URINE CULTURE: Culture: <10000 — AB

## 2024-08-03 LAB — HEMOGLOBIN A1C
Hgb A1c MFr Bld: 6.2 % — ABNORMAL HIGH (ref 4.8–5.6)
Mean Plasma Glucose: 131.24 mg/dL

## 2024-08-03 LAB — SPECIMEN STATUS REPORT

## 2024-08-03 LAB — PREALBUMIN: PREALBUMIN: 10 mg/dL — ABNORMAL LOW (ref 12–34)

## 2024-08-03 MED ORDER — STERILE WATER FOR INJECTION IV SOLN
INTRAMUSCULAR | Status: DC
  Filled 2024-08-03 (×3): qty 150

## 2024-08-03 MED ORDER — POTASSIUM CHLORIDE CRYS ER 20 MEQ PO TBCR
40.0000 meq | EXTENDED_RELEASE_TABLET | Freq: Once | ORAL | Status: AC
  Administered 2024-08-03: 40 meq via ORAL
  Filled 2024-08-03: qty 2

## 2024-08-03 NOTE — TOC Initial Note (Signed)
 Transition of Care [facility]) - Initial/Assessment Note    Patient Details  Name: [name] MRN: [id number] Date of Birth: [birth year]  [facility] of [facility]) CM/SW Contact:    Sonda Manuella Quill, RN Phone Number: 08/03/2024, 3:20 PM  Clinical Narrative:                 IP CM for d/c planning; no ins listed; spoke w/ pt and family in room; pt said she lives at home w/ family; pt identified POC dtr Joan Brunswick [phone]);  she plans to return home w/ their support; they will provide transportation; pt verified PCP; she said her [employer] insurance will start 08/04/24; pt denied SDOH risks; pt does not have DME, HH services, or home oxygen; awaiting PT eval; IP CM is following.  Expected Discharge Plan: Home/Self Care Barriers to Discharge: Continued Medical Work up   Patient Goals and CMS Choice Patient states their goals for this hospitalization and ongoing recovery are:: home          Expected Discharge Plan and Services   Discharge Planning Services: CM Consult   Living arrangements for the past 2 months: Single Family Home                 DME Arranged: N/A DME Agency: NA       HH Arranged: NA HH Agency: NA        Prior Living Arrangements/Services Living arrangements for the past 2 months: Single Family Home Lives with:: Relatives Patient language and need for interpreter reviewed:: Yes Do you feel safe going back to the place where you live?: Yes      Need for Family Participation in Patient Care: Yes (Comment) Care giver support system in place?: Yes (comment) Current home services:  (n/a) Criminal Activity/Legal Involvement Pertinent to Current Situation/Hospitalization: No - Comment as needed  Activities of Daily Living   ADL Screening (condition at time of admission) Independently performs ADLs?: Yes (appropriate for developmental age) Is the patient deaf or have difficulty hearing?: No Does the patient have difficulty seeing, even when  wearing glasses/contacts?: No Does the patient have difficulty concentrating, remembering, or making decisions?: No  Permission Sought/Granted Permission sought to share information with : Case Manager Permission granted to share information with : Yes, Verbal Permission Granted  Share Information with NAME: Case Manager     Permission granted to share info w Relationship: Joan Brunswick (dtr) [phone]     Emotional Assessment Appearance:: Appears stated age Attitude/Demeanor/Rapport: Gracious Affect (typically observed): Accepting Orientation: : Oriented to Self, Oriented to Place, Oriented to  Time, Oriented to Situation Alcohol / Substance Use: Not Applicable Psych Involvement: No (comment)  Admission diagnosis:  Dehydration [E86.0] UTI (urinary tract infection) [N39.0] Acute UTI [N39.0] AKI (acute kidney injury) [N17.9] Patient Active Problem List   Diagnosis Date Noted   UTI (urinary tract infection) 08/02/2024   Infectious gastroenteritis 08/01/2024   Dehydration 08/01/2024   Acute cystitis with hematuria 08/01/2024   Protein-calorie malnutrition 08/01/2024   Encounter for general adult medical examination w/o abnormal findings 01/10/2024   Body mass index (BMI) 19.9 or less, adult 01/10/2024   Weight loss, unintentional 01/10/2024   Tremor of left hand 01/06/2022   Neck pain 01/06/2022   Weakness 01/06/2022   Numbness 01/06/2022   Hypertensive nephropathy 05/24/2021   Hyperlipidemia associated with type 2 diabetes mellitus (HCC) 11/15/2019   Bilateral carpal tunnel syndrome 09/13/2018   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, right 08/02/2018   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   Kidney transplant status 10/19/2016   Heartburn 05/07/2016   Oropharyngeal dysphagia 05/07/2016   Leukocytosis 10/02/2011   Bacterial vaginosis 10/01/2011   Recurrent UTI 09/30/2011    Class 1 obesity due to excess calories with serious comorbidity and body mass index (BMI) of 30.0 to 30.9 in adult 03/30/2010   Peritonitis (HCC) 03/30/2010   HEMOCCULT POSITIVE STOOL 10/08/2009   GASTROPARESIS 11/22/2007   Type 2 diabetes mellitus with stage 2 chronic kidney disease, with long-term current use of insulin  (HCC) 10/03/2007   Anemia in chronic kidney disease 10/03/2007   Essential hypertension 10/03/2007   Peptic ulcer 10/03/2007   PCP:  Jarold Medici, MD Pharmacy:   [id number] - [place], [place] - [address] [place] [address] Phone: [phone] Fax: [phone]  [id number] - [place], [place] - [address] AT [facility] [address] [place] [address] Phone: [phone] Fax: [phone]     Social Drivers of Health (SDOH) Social History: SDOH Screenings   Food Insecurity: No Food Insecurity (08/03/2024)  Housing: Low Risk  (08/03/2024)  Recent Concern: Housing - High Risk (08/02/2024)  Transportation Needs: No Transportation Needs (08/03/2024)  Utilities: Not At Risk (08/03/2024)  Depression (PHQ2-9): Low Risk  (07/31/2024)  Social Connections: Unknown (02/14/2022)   Received from [facility]  Tobacco Use: Low Risk  (07/31/2024)   SDOH Interventions: Food Insecurity Interventions: Intervention Not Indicated, Inpatient TOC Housing Interventions: Intervention Not Indicated, Inpatient TOC Transportation Interventions: Intervention Not Indicated, Inpatient TOC Utilities Interventions: Intervention Not Indicated, Inpatient TOC   Readmission Risk Interventions    08/03/2024    3:10 PM  Readmission Risk Prevention Plan  Transportation Screening Complete  PCP or Specialist Appt within 5-7 Days Complete  Home Care Screening Complete  Medication Review (RN CM) Complete

## 2024-08-03 NOTE — Evaluation (Addendum)
 Physical Therapy Evaluation Patient Details Name: [name] MRN: [id number] DOB: [birth year] Today's Date: 08/03/2024  History of Present Illness  [name] is a 48 y.o. female presents toED with weakness, syncope, UTI. PMH: diabetes mellitus type 2, hypertension, hyperlipidemia, renal failure with history of renal transplant in 2011  Clinical Impression   Pt admitted with above diagnosis.  Pt currently with functional limitations due to the deficits listed below (see PT Problem List). Pt will benefit from acute skilled PT to increase their independence and safety with mobility to allow discharge.     Patient seated reclined in recliner. Patient reports that she has ambulated to BR with staff  BP seated 91/55, standing 79/47. Patient reports unable to progress ambulation due to dizziness.  Patient will benefit from further  PT to progress mobility and monitor BP.  Patient should progress to return home with family support. Patient independent and works/drives at baseline. Resides with  grown son and mother in 2 level townhome. Hopefully  patient's  medical status will improve to allow  return to baseline functioning and most likely will not  require post acute PT. Will recommend HHPT in case there are still concerns.  .      If plan is discharge home, recommend the following: A little help with walking and/or transfers;Assistance with cooking/housework;Help with stairs or ramp for entrance;Assist for transportation   Can travel by private vehicle        Equipment Recommendations None recommended by PT  Recommendations for Other Services       Functional Status Assessment Patient has had a recent decline in their functional status and demonstrates the ability to make significant improvements in function in a reasonable and predictable amount of time.     Precautions / Restrictions Precautions Precautions: Fall Precaution/Restrictions Comments:  orthostatic Restrictions Weight Bearing Restrictions Per Provider Order: No      Mobility  Bed Mobility               General bed mobility comments: in recliner    Transfers Overall transfer level: Needs assistance Equipment used: None Transfers: Sit to/from Stand Sit to Stand: Supervision           General transfer comment: stood for BP, supported on IV pole    Ambulation/Gait               General Gait Details: NT, BP drop and pt felt too weak  Stairs            Wheelchair Mobility     Tilt Bed    Modified Rankin (Stroke Patients Only)       Balance Overall balance assessment: Needs assistance Sitting-balance support: Feet supported, No upper extremity supported Sitting balance-Leahy Scale: Normal     Standing balance support: During functional activity, No upper extremity supported Standing balance-Leahy Scale: Fair                               Pertinent Vitals/Pain Pain Assessment Pain Assessment: No/denies pain    Home Living Family/patient expects to be discharged to:: Private residence Living Arrangements: Children;Other relatives;Parent Available Help at Discharge: Family Type of Home: House Home Access: Level entry     Alternate Level Stairs-Number of Steps: 15 Home Layout: Two level;Bed/bath upstairs Home Equipment: None      Prior Function Prior Level of Function : Independent/Modified Independent;Working/employed;Driving  Extremity/Trunk Assessment   Upper Extremity Assessment Upper Extremity Assessment: Overall WFL for tasks assessed    Lower Extremity Assessment Lower Extremity Assessment: Generalized weakness    Cervical / Trunk Assessment Cervical / Trunk Assessment: Normal  Communication   Communication Communication: No apparent difficulties    Cognition Arousal: Alert Behavior During Therapy: WFL for tasks assessed/performed   PT - Cognitive  impairments: No apparent impairments                         Following commands: Intact       Cueing       General Comments      Exercises     Assessment/Plan    PT Assessment Patient needs continued PT services  PT Problem List Decreased strength;Decreased activity tolerance;Decreased mobility;Cardiopulmonary status limiting activity;Decreased knowledge of precautions;Decreased knowledge of use of DME       PT Treatment Interventions Gait training;Functional mobility training;Stair training;Therapeutic activities;Balance training;Therapeutic exercise;Patient/family education    PT Goals (Current goals can be found in the Care Plan section)  Acute Rehab PT Goals Patient Stated Goal: to go home PT Goal Formulation: With patient Time For Goal Achievement: 08/17/24 Potential to Achieve Goals: Good    Frequency Min 3X/week     Co-evaluation               AM-PAC PT 6 Clicks Mobility  Outcome Measure Help needed turning from your back to your side while in a flat bed without using bedrails?: None Help needed moving from lying on your back to sitting on the side of a flat bed without using bedrails?: None Help needed moving to and from a bed to a chair (including a wheelchair)?: A Little Help needed standing up from a chair using your arms (e.g., wheelchair or bedside chair)?: A Little Help needed to walk in hospital room?: A Lot Help needed climbing 3-5 steps with a railing? : A Lot 6 Click Score: 18    End of Session   Activity Tolerance: Treatment limited secondary to medical complications (Comment) Patient left: in chair;with call bell/phone within reach;with nursing/sitter in room Nurse Communication: Mobility status PT Visit Diagnosis: Unsteadiness on feet (R26.81);Muscle weakness (generalized) (M62.81);Difficulty in walking, not elsewhere classified (R26.2);Dizziness and giddiness (R42)    Time: 8440-8388 PT Time Calculation (min) (ACUTE  ONLY): 12 min   Charges:   PT Evaluation $PT Eval Low Complexity: 1 Low   PT General Charges $$ ACUTE PT VISIT: 1 Visit         Darice Potters PT Acute Rehabilitation Services Office [phone]   Potters Darice Norris 08/03/2024, 4:18 PM

## 2024-08-03 NOTE — Progress Notes (Signed)
 PROGRESS NOTE    [name]  FMW:[id number] DOB: [birth year] DOA: 08/02/2024 PCP: Jarold Medici, MD   Brief Narrative:  48 y.o. female with medical history significant of diabetes mellitus type 2, hypertension, hyperlipidemia, renal failure with history of renal transplant in 2011 currently being followed by Dr. Bernell kidney presented with weakness and possible UTI.  On presentation, bicarb was 13, creatinine of 2.52.  CT of the head did not show any acute intracranial abnormality.  UA was suggestive of UTI.  She was started on IV fluids and antibiotics.  Assessment & Plan:   Possible UTI: Present on admission - Continue Rocephin .  Follow blood and urine cultures: Currently negative so far.   Acute kidney injury Acute metabolic acidosis Dehydration - Presented with creatinine of 2.52 and 2.6.  Possibly prerenal from dehydration.   -Currently on bicarb drip.  Bicarbonate improving to 19 today and creatinine of 2.47.  Continue bicarb drip for today.  Repeat a.m. labs.    Hypokalemia--replace.  Repeat a.m. labs   Diabetes mellitus type 2 with hyperglycemia - Follow A1c.  CBGs with SSI.  Carb modified diet.   Anemia of chronic disease - From chronic illnesses.  Hemoglobin stable.  Monitor intermittently   History of renal transplantation - Recently followed by Dr. Bernell kidney.  Continue mycophenolate , prednisone  and tacrolimus   Hypertension - Monitor blood pressure.  Currently on the lower side.  Will hold antihypertensives for now       DVT prophylaxis: Patient refused subcutaneous heparin .  Start SCDs.   Code Status: Full Family Communication: Sister at bedside on 08/02/2024 Disposition Plan: Status is: Inpatient Remains inpatient appropriate because: Of severity of illness    Consultants: None  Procedures: None  Antimicrobials: Rocephin  from 08/02/2024 onwards   Subjective: Patient seen and examined at bedside.  Feels slightly  better but still feels very weak.  No fever, chest pain, shortness of breath reported. Objective: Vitals:   08/02/24 2034 08/02/24 2231 08/03/24 0111 08/03/24 0416  BP: (!) 140/64  (!) 105/59 (!) 110/42  Pulse: 88  74 70  Resp: 18  18 17   Temp: 100 F (37.8 C) 99.1 F (37.3 C) 98.8 F (37.1 C) 98.9 F (37.2 C)  TempSrc:  Oral    SpO2: 100%  98% 98%    Intake/Output Summary (Last 24 hours) at 08/03/2024 9180 Last data filed at 08/02/2024 1600 Gross per 24 hour  Intake 1422.99 ml  Output --  Net 1422.99 ml   There were no vitals filed for this visit.  Examination:  General exam: Appears calm and comfortable  Respiratory system: Bilateral decreased breath sounds at bases, no wheezing Cardiovascular system: S1 & S2 heard, Rate controlled currently Gastrointestinal system: Abdomen is nondistended, soft and nontender. Normal bowel sounds heard. Extremities: No cyanosis, clubbing, edema  Central nervous system: Awake, slow to respond.  No focal neurological deficits. Moving extremities Skin: No rashes, lesions or ulcers Psychiatry: Flat affect with no signs of agitation.    Data Reviewed: I have personally reviewed following labs and imaging studies  CBC: Recent Labs  Lab 08/02/24 0945 08/02/24 1120 08/03/24 0537  WBC 8.9  --  6.9  NEUTROABS 7.8*  --   --   HGB 9.5* 8.8* 7.8*  HCT 30.9* 26.0* 24.8*  MCV 90.1  --  88.6  PLT 206  --  174   Basic Metabolic Panel: Recent Labs  Lab 07/31/24 1712 08/02/24 0945 08/02/24 1120 08/03/24 0537  NA 142 138 141 141  K  3.6 3.3* 3.3* 3.4*  CL 113* 111 113* 111  CO2 13* 13*  --  19*  GLUCOSE 69* 226* 209* 92  BUN 42* 50* 47* 47*  CREATININE 2.21* 2.52* 2.60* 2.47*  CALCIUM 7.8* 8.0*  --  7.7*   GFR: Estimated Creatinine Clearance: 20.9 mL/min (A) (by C-G formula based on SCr of 2.47 mg/dL (H)). Liver Function Tests: Recent Labs  Lab 07/31/24 1712 08/03/24 0537  AST 34 18  ALT 12 12  ALKPHOS 103 63  BILITOT 0.6  0.7  PROT 5.9* 5.1*  ALBUMIN 3.4* 2.5*   No results for input(s): LIPASE, AMYLASE in the last 168 hours. No results for input(s): AMMONIA in the last 168 hours. Coagulation Profile: No results for input(s): INR, PROTIME in the last 168 hours. Cardiac Enzymes: No results for input(s): CKTOTAL, CKMB, CKMBINDEX, TROPONINI in the last 168 hours. BNP (last 3 results) No results for input(s): PROBNP in the last 8760 hours. HbA1C: No results for input(s): HGBA1C in the last 72 hours. CBG: Recent Labs  Lab 08/02/24 1235 08/02/24 1631 08/02/24 2117 08/03/24 0727  GLUCAP 191* 196* 210* 83   Lipid Profile: No results for input(s): CHOL, HDL, LDLCALC, TRIG, CHOLHDL, LDLDIRECT in the last 72 hours. Thyroid  Function Tests: No results for input(s): TSH, T4TOTAL, FREET4, T3FREE, THYROIDAB in the last 72 hours. Anemia Panel: No results for input(s): VITAMINB12, FOLATE, FERRITIN, TIBC, IRON, RETICCTPCT in the last 72 hours. Sepsis Labs: Recent Labs  Lab 08/02/24 1120  LATICACIDVEN 0.9    No results found for this or any previous visit (from the past 240 hours).       Radiology Studies: CT Head Wo Contrast Result Date: 08/02/2024 EXAM: CT HEAD WITHOUT CONTRAST 08/02/2024 10:30:39 AM TECHNIQUE: CT of the head was performed without the administration of intravenous contrast. Automated exposure control, iterative reconstruction, and/or weight based adjustment of the mA/kV was utilized to reduce the radiation dose to as low as reasonably achievable. COMPARISON: CT of the head dated 01/25/2004. CLINICAL HISTORY: Head trauma, moderate-severe; syncope and hit head. FINDINGS: BRAIN AND VENTRICLES: No acute hemorrhage. No evidence of acute infarct. No hydrocephalus. No extra-axial collection. No mass effect or midline shift. Atherosclerotic calcifications involving the large vessels of the skull base. ORBITS: No acute abnormality. SINUSES:  Mucosal thickening throughout the paranasal sinuses. SOFT TISSUES AND SKULL: No acute soft tissue abnormality. No skull fracture. IMPRESSION: 1. No acute intracranial abnormality. 2. Mucosal thickening throughout the paranasal sinuses. Electronically signed by: Evalene Coho MD 08/02/2024 10:33 AM EDT RP Workstation: [id number]        Scheduled Meds:  heparin   5,000 Units Subcutaneous Q8H   insulin  aspart  0-5 Units Subcutaneous QHS   insulin  aspart  0-9 Units Subcutaneous TID WC   mycophenolate   500 mg Oral q AM   And   mycophenolate   750 mg Oral QPM   predniSONE   5 mg Oral Daily   tacrolimus   1.5 mg Oral BID   Continuous Infusions:  cefTRIAXone  (ROCEPHIN )  IV            Sophie Mao, MD [facility] 08/03/2024, 8:19 AM

## 2024-08-04 ENCOUNTER — Other Ambulatory Visit (HOSPITAL_COMMUNITY): Payer: Self-pay

## 2024-08-04 DIAGNOSIS — I10 Essential (primary) hypertension: Secondary | ICD-10-CM | POA: Diagnosis not present

## 2024-08-04 DIAGNOSIS — E8721 Acute metabolic acidosis: Secondary | ICD-10-CM | POA: Diagnosis not present

## 2024-08-04 DIAGNOSIS — E785 Hyperlipidemia, unspecified: Secondary | ICD-10-CM | POA: Diagnosis not present

## 2024-08-04 DIAGNOSIS — B9620 Unspecified Escherichia coli [E. coli] as the cause of diseases classified elsewhere: Secondary | ICD-10-CM | POA: Diagnosis not present

## 2024-08-04 DIAGNOSIS — Z8249 Family history of ischemic heart disease and other diseases of the circulatory system: Secondary | ICD-10-CM | POA: Diagnosis not present

## 2024-08-04 DIAGNOSIS — E860 Dehydration: Secondary | ICD-10-CM | POA: Diagnosis present

## 2024-08-04 DIAGNOSIS — E1165 Type 2 diabetes mellitus with hyperglycemia: Secondary | ICD-10-CM | POA: Diagnosis not present

## 2024-08-04 DIAGNOSIS — N179 Acute kidney failure, unspecified: Secondary | ICD-10-CM | POA: Diagnosis not present

## 2024-08-04 DIAGNOSIS — Z79899 Other long term (current) drug therapy: Secondary | ICD-10-CM | POA: Diagnosis not present

## 2024-08-04 DIAGNOSIS — Z833 Family history of diabetes mellitus: Secondary | ICD-10-CM | POA: Diagnosis not present

## 2024-08-04 DIAGNOSIS — E876 Hypokalemia: Secondary | ICD-10-CM | POA: Diagnosis not present

## 2024-08-04 DIAGNOSIS — E872 Acidosis, unspecified: Secondary | ICD-10-CM | POA: Diagnosis not present

## 2024-08-04 DIAGNOSIS — Z91048 Other nonmedicinal substance allergy status: Secondary | ICD-10-CM | POA: Diagnosis not present

## 2024-08-04 DIAGNOSIS — Z8261 Family history of arthritis: Secondary | ICD-10-CM | POA: Diagnosis not present

## 2024-08-04 DIAGNOSIS — Z794 Long term (current) use of insulin: Secondary | ICD-10-CM | POA: Diagnosis not present

## 2024-08-04 DIAGNOSIS — N390 Urinary tract infection, site not specified: Secondary | ICD-10-CM | POA: Diagnosis not present

## 2024-08-04 DIAGNOSIS — Z1612 Extended spectrum beta lactamase (ESBL) resistance: Secondary | ICD-10-CM | POA: Diagnosis not present

## 2024-08-04 DIAGNOSIS — D638 Anemia in other chronic diseases classified elsewhere: Secondary | ICD-10-CM | POA: Diagnosis not present

## 2024-08-04 LAB — CBC WITH DIFFERENTIAL/PLATELET
Abs Immature Granulocytes: 0.03 K/uL (ref 0.00–0.07)
Basophils Absolute: 0 K/uL (ref 0.0–0.1)
Basophils Relative: 0 %
Eosinophils Absolute: 0.1 K/uL (ref 0.0–0.5)
Eosinophils Relative: 2 %
HCT: 22.9 % — ABNORMAL LOW (ref 36.0–46.0)
Hemoglobin: 7.4 g/dL — ABNORMAL LOW (ref 12.0–15.0)
Immature Granulocytes: 1 %
Lymphocytes Relative: 7 %
Lymphs Abs: 0.4 K/uL — ABNORMAL LOW (ref 0.7–4.0)
MCH: 28.7 pg (ref 26.0–34.0)
MCHC: 32.3 g/dL (ref 30.0–36.0)
MCV: 88.8 fL (ref 80.0–100.0)
Monocytes Absolute: 0.5 K/uL (ref 0.1–1.0)
Monocytes Relative: 10 %
Neutro Abs: 4.2 K/uL (ref 1.7–7.7)
Neutrophils Relative %: 80 %
Platelets: 179 K/uL (ref 150–400)
RBC: 2.58 MIL/uL — ABNORMAL LOW (ref 3.87–5.11)
RDW: 13.8 % (ref 11.5–15.5)
WBC: 5.2 K/uL (ref 4.0–10.5)
nRBC: 0 % (ref 0.0–0.2)

## 2024-08-04 LAB — BASIC METABOLIC PANEL WITH GFR
Anion gap: 9 (ref 5–15)
BUN: 55 mg/dL — ABNORMAL HIGH (ref 6–20)
CO2: 23 mmol/L (ref 22–32)
Calcium: 7.1 mg/dL — ABNORMAL LOW (ref 8.9–10.3)
Chloride: 105 mmol/L (ref 98–111)
Creatinine, Ser: 2.36 mg/dL — ABNORMAL HIGH (ref 0.44–1.00)
GFR, Estimated: 25 mL/min — ABNORMAL LOW (ref >60)
Glucose, Bld: 436 mg/dL — ABNORMAL HIGH (ref 70–99)
Potassium: 3.6 mmol/L (ref 3.5–5.1)
Sodium: 137 mmol/L (ref 135–145)

## 2024-08-04 LAB — GLUCOSE, CAPILLARY
Glucose-Capillary: 379 mg/dL — ABNORMAL HIGH (ref 70–99)
Glucose-Capillary: 304 mg/dL — ABNORMAL HIGH (ref 70–99)

## 2024-08-04 LAB — MAGNESIUM: Magnesium: 1.4 mg/dL — ABNORMAL LOW (ref 1.7–2.4)

## 2024-08-04 MED ORDER — CIPROFLOXACIN HCL 500 MG PO TABS
500.0000 mg | ORAL_TABLET | Freq: Two times a day (BID) | ORAL | 0 refills | Status: DC
  Filled 2024-08-04: qty 10, 5d supply, fill #0

## 2024-08-04 MED ORDER — MAGNESIUM SULFATE 4 GM/100ML IV SOLN
4.0000 g | Freq: Once | INTRAVENOUS | Status: AC
  Administered 2024-08-04: 4 g via INTRAVENOUS
  Filled 2024-08-04: qty 100

## 2024-08-04 MED ORDER — FOSFOMYCIN TROMETHAMINE 3 G PO PACK
3.0000 g | PACK | Freq: Once | ORAL | Status: AC
  Administered 2024-08-04: 3 g via ORAL
  Filled 2024-08-04: qty 3

## 2024-08-04 MED ORDER — CIPROFLOXACIN HCL 250 MG PO TABS
250.0000 mg | ORAL_TABLET | Freq: Two times a day (BID) | ORAL | 0 refills | Status: AC
  Filled 2024-08-04: qty 10, 5d supply, fill #0

## 2024-08-04 NOTE — Progress Notes (Signed)
 Discharge med ina secure bag delivered to patient in room by this RN

## 2024-08-04 NOTE — Plan of Care (Signed)

## 2024-08-04 NOTE — Progress Notes (Signed)
 IV removed, dressed, belongings packed, transferred to front entrance via wheelchair, instructions reviewed with understanding verbalized.

## 2024-08-04 NOTE — Progress Notes (Signed)
 IV removed, instructions reviewed with understanding verbalized, transferred to front entrance via wheelchair.

## 2024-08-04 NOTE — Discharge Summary (Signed)
 Physician Discharge Summary  [name] FMW:[id number] DOB: 24-Sep-1976 DOA: 08/02/2024  PCP: Jarold Medici, MD  Admit date: 08/02/2024 Discharge date: 08/04/2024  Admitted From: Home Disposition: Home  Recommendations for Outpatient Follow-up:  Follow up with PCP in 1 week with repeat CBC/BMP Outpatient follow-up with Dr. Coladonato/nephrology as scheduled Follow up in ED if symptoms worsen or new appear   Home Health: No Equipment/Devices: None  Discharge Condition: Stable CODE STATUS: Full Diet recommendation: Carb modified  Brief/Interim Summary: 48 y.o. female with medical history significant of diabetes mellitus type 2, hypertension, hyperlipidemia, renal failure with history of renal transplant in 2011 currently being followed by Dr. Bernell kidney presented with weakness and possible UTI.  On presentation, bicarb was 13, creatinine of 2.52.  CT of the head did not show any acute intracranial abnormality.  UA was suggestive of UTI.  She was started on IV fluids and antibiotics.  During hospitalization, her condition has improved.  Her acidosis has resolved.  Prior to admission urine culture grew ESBL E. coli.  She will be getting 1 dose of fosfomycin today.  She feels much better, tolerating diet and feels okay to go home today.  She will be discharged home on oral ciprofloxacin  for 5 days.  Outpatient follow-up with PCP and nephrology  Discharge Diagnoses:   Possible UTI: Present on admission - Currently on Rocephin .  Blood cultures negative so far. Prior to admission urine culture grew ESBL E. coli.  She will be getting 1 dose of fosfomycin today.  She feels much better, tolerating diet and feels okay to go home today.  She will be discharged home on oral ciprofloxacin  for 5 days.  Outpatient follow-up with PCP and nephrology   Acute kidney injury Acute metabolic acidosis: Resolved Dehydration: Resolved - Presented with creatinine of 2.52 and 2.6.  Possibly  prerenal from dehydration.   -Currently on bicarb drip.  Bicarbonate improving to 23 oday and creatinine of 2.36.  Encouraged patient to continue oral hydration.  Patient wants to go home today.  Will need outpatient follow-up of BMP next week with outpatient follow-up with PCP and nephrology.    Hypokalemia--improved  Diabetes mellitus type 2 with hyperglycemia - A1c 6.2.  Continue home regimen.  Outpatient follow-up with PCP.  Carb modified diet.   Anemia of chronic disease - From chronic illnesses.  Hemoglobin stable.  Monitor intermittently   History of renal transplantation - Recently followed by Dr. Bernell kidney.  Continue mycophenolate , prednisone  and tacrolimus    Hypertension - Monitor blood pressure.  Currently on the lower side.  Will hold antihypertensives for now till reevaluation with PCP.   Hypomagnesemia - Replace prior to discharge.   Discharge Instructions  Discharge Instructions     Diet Carb Modified   Complete by: As directed    Increase activity slowly   Complete by: As directed       Allergies as of 08/04/2024       Reactions   Adhesive [tape] Other (See Comments)   plastic tape irritates skin   Ok with paper tape   Strawberry (diagnostic)    Wound Dressing Adhesive    Other reaction(s): Other plastic tape irritates skin   Ok with paper tape        Medication List     STOP taking these medications    amLODipine  5 MG tablet Commonly known as: NORVASC    doxycycline 100 MG tablet Commonly known as: VIBRA-TABS   LORazepam 1 MG tablet Commonly known as: ATIVAN  traZODone 50 MG tablet Commonly known as: DESYREL       TAKE these medications    albuterol 108 (90 Base) MCG/ACT inhaler Commonly known as: VENTOLIN HFA Inhale 1-2 puffs into the lungs every 4 (four) hours as needed for wheezing or shortness of breath.   azelastine 0.05 % ophthalmic solution Commonly known as: OPTIVAR Place 2 drops into both eyes 2  (two) times daily as needed (allergies).   BD Pen Needle Nano 2nd Gen 32G X 4 MM Misc Generic drug: Insulin  Pen Needle USE 3X A DAY   ciprofloxacin  250 MG tablet Commonly known as: CIPRO  Take 1 tablet (250 mg total) by mouth 2 (two) times daily for 5 days. Start taking on: August 05, 2024   Dexcom G7 Sensor Misc Change sensor every 10 days   Dexcom G7 [employer] Use to check glucose continuously, change sensor every 10 days   FreeStyle Libre 3 Plus Sensor Misc INJECT 1 DEVICE INTO THE SKIN CONTINUOUS. CHANGE EVERY 15 DAYS   levonorgestrel 20 MCG/24HR IUD Commonly known as: MIRENA by Intrauterine route.   Mounjaro  5 MG/0.5ML Pen Generic drug: tirzepatide  Inject under skin 5 mg every other week   mycophenolate  250 MG capsule Commonly known as: CELLCEPT  Take 500-750 mg by mouth See admin instructions. 500 mg every morning  750 mg every evening   omeprazole 20 MG capsule Commonly known as: PRILOSEC Take 20 mg by mouth daily.   ondansetron  4 MG disintegrating tablet Commonly known as: ZOFRAN -ODT Take 1 tablet (4 mg total) by mouth every 8 (eight) hours as needed for nausea or vomiting.   predniSONE  5 MG tablet Commonly known as: DELTASONE  Take 5 mg by mouth daily.   tacrolimus  0.5 MG capsule Commonly known as: PROGRAF  Take 1.5 mg by mouth 2 (two) times daily.   Tresiba  FlexTouch 100 UNIT/ML FlexTouch Pen Generic drug: insulin  degludec INJECT 20 UNITS INTO THE SKIN DAILY What changed: how much to take   Vitamin D3 125 MCG (5000 UT) Caps Take 5,000 Units by mouth daily.        Follow-up Information     Jarold Medici, MD. Schedule an appointment as soon as possible for a visit in 1 week(s).   Specialty: Internal Medicine Why: with repeat bmp Contact information: [address] [place] [address] [phone]                Allergies  Allergen Reactions   Adhesive [Tape] Other (See Comments)    plastic tape irritates skin    Ok with paper tape   Strawberry (Diagnostic)    Wound Dressing Adhesive     Other reaction(s): Other plastic tape irritates skin   Ok with paper tape    Consultations: None   Procedures/Studies: CT Head Wo Contrast Result Date: 08/02/2024 EXAM: CT HEAD WITHOUT CONTRAST 08/02/2024 10:30:39 AM TECHNIQUE: CT of the head was performed without the administration of intravenous contrast. Automated exposure control, iterative reconstruction, and/or weight based adjustment of the mA/kV was utilized to reduce the radiation dose to as low as reasonably achievable. COMPARISON: CT of the head dated 01/25/2004. CLINICAL HISTORY: Head trauma, moderate-severe; syncope and hit head. FINDINGS: BRAIN AND VENTRICLES: No acute hemorrhage. No evidence of acute infarct. No hydrocephalus. No extra-axial collection. No mass effect or midline shift. Atherosclerotic calcifications involving the large vessels of the skull base. ORBITS: No acute abnormality. SINUSES: Mucosal thickening throughout the paranasal sinuses. SOFT TISSUES AND SKULL: No acute soft tissue abnormality. No skull fracture. IMPRESSION:  1. No acute intracranial abnormality. 2. Mucosal thickening throughout the paranasal sinuses. Electronically signed by: Evalene Coho MD 08/02/2024 10:33 AM EDT RP Workstation: [id number]   CT ABDOMEN PELVIS WO CONTRAST Result Date: 07/19/2024 CLINICAL DATA:  Diarrhea Diarrhea, immunocompromised, assess for colitis EXAM: CT ABDOMEN AND PELVIS WITHOUT CONTRAST TECHNIQUE: Multidetector CT imaging of the abdomen and pelvis was performed following the standard protocol without IV contrast. RADIATION DOSE REDUCTION: This exam was performed according to the departmental dose-optimization program which includes automated exposure control, adjustment of the mA and/or kV according to patient size and/or use of iterative reconstruction technique. COMPARISON:  12/21/2021 FINDINGS: Lower chest: No acute findings extensive coronary  artery calcifications, advanced for patient's age. No effusions. Hepatobiliary: No focal hepatic abnormality. Gallbladder unremarkable. Pancreas: No focal abnormality or ductal dilatation. Spleen: No focal abnormality.  Normal size. Adrenals/Urinary Tract: Adrenal glands normal. 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 No hydronephrosis is crash that no hydronephrosis. Right lower quadrant renal transplant noted also without hydronephrosis. Small low-density lesion measures 1.5 cm compared to 1.1 cm previously. This most likely reflects a small cyst. No follow-up imaging recommended. Urinary bladder unremarkable. Stomach/Bowel: Fluid throughout the colon with air-fluid levels which may reflect a diarrheal condition. No areas of wall thickening or surrounding inflammatory change to suggest colitis. Stomach and small bowel decompressed. Vascular/Lymphatic: Diffuse aortoiliac and branch vessel atherosclerosis. No evidence of aneurysm or adenopathy. Reproductive: Uterus and adnexa unremarkable. No mass. IUD in the uterus. Other: No free fluid or free air. Musculoskeletal: No acute bony abnormality. IMPRESSION: No evidence of colitis. Fluid throughout the colon with air-fluid levels may reflect diarrhea. Diffuse aortic/arterial atherosclerosis, including coronary artery disease. Right lower quadrant renal transplant grossly unremarkable. Electronically Signed   By: Franky Crease M.D.   On: 07/19/2024 17:21      Subjective: Patient seen and examined at bedside.  Feels better.  Denied any vomiting, diarrhea.  Tolerating diet.  No fever reported.  Wants to go home today.  Discharge Exam: Vitals:   08/03/24 1958 08/04/24 0504  BP: (!) 117/51 108/65  Pulse: 76 74  Resp: 18 18  Temp: 98.4 F (36.9 C) 98.4 F (36.9 C)  SpO2: 100% 99%    General: Pt is alert, awake, not in acute distress.  On room air. Cardiovascular: rate controlled, S1/S2  + Respiratory: bilateral decreased breath sounds at bases Abdominal: Soft, NT, ND, bowel sounds + Extremities: no edema, no cyanosis    The results of significant diagnostics from this hospitalization (including imaging, microbiology, ancillary and laboratory) are listed below for reference.     Microbiology: Recent Results (from the past 240 hours)  Culture, Urine     Status: Abnormal   Collection Time: 07/31/24  5:10 PM   Specimen: Urine   UR  Result Value Ref Range Status   Urine Culture, Routine Final report (A)  Final   Organism ID, Bacteria Escherichia coli (A)  Final    Comment: Susceptibility profile is consistent with a probable ESBL. Greater than 100,000 colony forming units per mL    Antimicrobial Susceptibility Comment  Final    Comment:       ** S = Susceptible; I = Intermediate; R = Resistant **                    P = Positive; N = Negative             MICS are expressed in micrograms per mL  Antibiotic                 RSLT#1    RSLT#2    RSLT#3    RSLT#4 Amoxicillin /Clavulanic Acid    S Ampicillin                     R Cefazolin                       R Cefepime                       I Cefoxitin                      S Cefpodoxime                    R Ceftriaxone                     R Ciprofloxacin                   S Ertapenem                      S Gentamicin                     S Levofloxacin                   S Meropenem                      S Nitrofurantoin                  S Piperacillin/Tazobactam        S Tetracycline                   S Tobramycin                     S Trimethoprim/Sulfa             S   Urine Culture     Status: Abnormal   Collection Time: 08/02/24  9:40 AM   Specimen: Urine, Clean Catch  Result Value Ref Range Status   Specimen Description   Final    URINE, CLEAN CATCH Performed at [facility], [address] [place], [place] [address]    Special Requests   Final    Immunocompromised Performed at [facility], [address], [place], [place] [address]    Culture (A)  Final    <10,000 COLONIES/mL INSIGNIFICANT GROWTH Performed at [facility], [address], [place], [place] [address]    Report Status 08/03/2024 FINAL  Final  Culture, blood (routine x 2)     Status: None (Preliminary result)   Collection Time: 08/02/24  9:45 AM   Specimen: BLOOD  Result Value Ref Range Status   Specimen Description   Final    BLOOD BLOOD LEFT ARM Performed at [facility], [address], [place], [place] [address]    Special Requests   Final    BOTTLES DRAWN AEROBIC AND ANAEROBIC Blood Culture results may not be optimal due to an inadequate volume of blood received in culture bottles Performed at [facility], [address] [place], [place] [address]    Culture   Final    NO GROWTH < 24 HOURS Performed  at [facility], [address], [place], [place] [address]    Report Status PENDING  Incomplete  Culture, blood (routine x 2)     Status: None (Preliminary result)   Collection Time: 08/02/24  9:48 AM   Specimen: BLOOD  Result Value Ref Range Status   Specimen Description   Final    BLOOD BLOOD RIGHT ARM Performed at [facility], [address] [place], [place] [address]    Special Requests   Final    BOTTLES DRAWN AEROBIC AND ANAEROBIC Blood Culture results may not be optimal due to an inadequate volume of blood received in culture bottles Performed at [facility], [address], [place], [place] [address]    Culture   Final    NO GROWTH < 24 HOURS Performed at [facility], [address], [place], [place] [address]    Report Status PENDING  Incomplete     Labs: BNP (last 3 results) No results for input(s): BNP in the last 8760 hours. Basic Metabolic Panel: Recent Labs  Lab 07/31/24 1712 08/02/24 0945 08/02/24 1120 08/03/24 0537 08/04/24 0622  NA 142 138  141 141 137  K 3.6 3.3* 3.3* 3.4* 3.6  CL 113* 111 113* 111 105  CO2 13* 13*  --  19* 23  GLUCOSE 69* 226* 209* 92 436*  BUN 42* 50* 47* 47* 55*  CREATININE 2.21* 2.52* 2.60* 2.47* 2.36*  CALCIUM 7.8* 8.0*  --  7.7* 7.1*  MG  --   --   --   --  1.4*   Liver Function Tests: Recent Labs  Lab 07/31/24 1712 08/03/24 0537  AST 34 18  ALT 12 12  ALKPHOS 103 63  BILITOT 0.6 0.7  PROT 5.9* 5.1*  ALBUMIN 3.4* 2.5*   No results for input(s): LIPASE, AMYLASE in the last 168 hours. No results for input(s): AMMONIA in the last 168 hours. CBC: Recent Labs  Lab 08/02/24 0945 08/02/24 1120 08/03/24 0537 08/04/24 0622  WBC 8.9  --  6.9 5.2  NEUTROABS 7.8*  --   --  4.2  HGB 9.5* 8.8* 7.8* 7.4*  HCT 30.9* 26.0* 24.8* 22.9*  MCV 90.1  --  88.6 88.8  PLT 206  --  174 179   Cardiac Enzymes: No results for input(s): CKTOTAL, CKMB, CKMBINDEX, TROPONINI in the last 168 hours. BNP: Invalid input(s): POCBNP CBG: Recent Labs  Lab 08/03/24 0727 08/03/24 1154 08/03/24 1609 08/03/24 2118 08/04/24 0753  GLUCAP 83 154* 233* 323* 379*   D-Dimer No results for input(s): DDIMER in the last 72 hours. Hgb A1c Recent Labs    08/03/24 0537  HGBA1C 6.2*   Lipid Profile No results for input(s): CHOL, HDL, LDLCALC, TRIG, CHOLHDL, LDLDIRECT in the last 72 hours. Thyroid  function studies No results for input(s): TSH, T4TOTAL, T3FREE, THYROIDAB in the last 72 hours.  Invalid input(s): FREET3 Anemia work up No results for input(s): VITAMINB12, FOLATE, FERRITIN, TIBC, IRON, RETICCTPCT in the last 72 hours. Urinalysis    Component Value Date/Time   COLORURINE YELLOW 08/02/2024 0940   APPEARANCEUR CLOUDY (A) 08/02/2024 0940   LABSPEC 1.012 08/02/2024 0940   PHURINE 5.0 08/02/2024 0940   GLUCOSEU NEGATIVE 08/02/2024 0940   HGBUR MODERATE (A) 08/02/2024 0940   BILIRUBINUR NEGATIVE 08/02/2024 0940   BILIRUBINUR negative 07/31/2024 1643    BILIRUBINUR Negative 01/10/2023 0831   KETONESUR NEGATIVE 08/02/2024 0940   PROTEINUR 100 (A) 08/02/2024 0940   UROBILINOGEN 0.2 07/31/2024  1643   UROBILINOGEN 1.0 09/30/2011 0825   NITRITE NEGATIVE 08/02/2024 0940   LEUKOCYTESUR LARGE (A) 08/02/2024 0940   Sepsis Labs Recent Labs  Lab 08/02/24 0945 08/03/24 0537 08/04/24 0622  WBC 8.9 6.9 5.2   Microbiology Recent Results (from the past 240 hours)  Culture, Urine     Status: Abnormal   Collection Time: 07/31/24  5:10 PM   Specimen: Urine   UR  Result Value Ref Range Status   Urine Culture, Routine Final report (A)  Final   Organism ID, Bacteria Escherichia coli (A)  Final    Comment: Susceptibility profile is consistent with a probable ESBL. Greater than 100,000 colony forming units per mL    Antimicrobial Susceptibility Comment  Final    Comment:       ** S = Susceptible; I = Intermediate; R = Resistant **                    P = Positive; N = Negative             MICS are expressed in micrograms per mL    Antibiotic                 RSLT#1    RSLT#2    RSLT#3    RSLT#4 Amoxicillin /Clavulanic Acid    S Ampicillin                     R Cefazolin                       R Cefepime                       I Cefoxitin                      S Cefpodoxime                    R Ceftriaxone                     R Ciprofloxacin                   S Ertapenem                      S Gentamicin                     S Levofloxacin                   S Meropenem                      S Nitrofurantoin                  S Piperacillin/Tazobactam        S Tetracycline                   S Tobramycin                     S Trimethoprim/Sulfa             S   Urine Culture     Status: Abnormal   Collection Time: 08/02/24  9:40 AM   Specimen: Urine, Clean Catch  Result Value Ref Range Status   Specimen Description   Final    URINE, CLEAN CATCH Performed at  [facility], [address] [place], [place] [address]    Special  Requests   Final    Immunocompromised Performed at [facility], [address] [place], [place] [address]    Culture (A)  Final    <10,000 COLONIES/mL INSIGNIFICANT GROWTH Performed at [facility], [address] [place], [place] [address]    Report Status 08/03/2024 FINAL  Final  Culture, blood (routine x 2)     Status: None (Preliminary result)   Collection Time: 08/02/24  9:45 AM   Specimen: BLOOD  Result Value Ref Range Status   Specimen Description   Final    BLOOD BLOOD LEFT ARM Performed at [facility], [address] [place], [place] [address]    Special Requests   Final    BOTTLES DRAWN AEROBIC AND ANAEROBIC Blood Culture results may not be optimal due to an inadequate volume of blood received in culture bottles Performed at [facility], [address] [place], [place] [address]    Culture   Final    NO GROWTH < 24 HOURS Performed at [facility], [address] [place], [place] [address]    Report Status PENDING  Incomplete  Culture, blood (routine x 2)     Status: None (Preliminary result)   Collection Time: 08/02/24  9:48 AM   Specimen: BLOOD  Result Value Ref Range Status   Specimen Description   Final    BLOOD BLOOD RIGHT ARM Performed at [facility], [address], [place], [place] [address]    Special Requests   Final    BOTTLES DRAWN AEROBIC AND ANAEROBIC Blood Culture results may not be optimal due to an inadequate volume of blood received in culture bottles Performed at [facility], [address], [place], [place] [address]    Culture   Final    NO GROWTH < 24 HOURS Performed at [facility], [address] [place], [place] [address]    Report Status PENDING  Incomplete     Time coordinating discharge: 35 minutes  SIGNED:   Sophie Mao, MD  [facility] 08/04/2024, 9:47 AM

## 2024-08-04 NOTE — Plan of Care (Signed)
  Problem: Education: Goal: Ability to describe self-care measures that may prevent or decrease complications (Diabetes Survival Skills Education) will improve 08/04/2024 1100 by Alaina Dozier PARAS, RN Outcome: Adequate for Discharge 08/04/2024 0735 by Alaina Dozier PARAS, RN Outcome: Progressing Goal: Individualized Educational Video(s) 08/04/2024 1100 by Alaina Dozier PARAS, RN Outcome: Adequate for Discharge 08/04/2024 0735 by Alaina Dozier PARAS, RN Outcome: Progressing   Problem: Coping: Goal: Ability to adjust to condition or change in health will improve 08/04/2024 1100 by Alaina Dozier PARAS, RN Outcome: Adequate for Discharge 08/04/2024 0735 by Alaina Dozier PARAS, RN Outcome: Progressing   Problem: Fluid Volume: Goal: Ability to maintain a balanced intake and output will improve 08/04/2024 1100 by Alaina Dozier PARAS, RN Outcome: Adequate for Discharge 08/04/2024 0735 by Alaina Dozier PARAS, RN Outcome: Progressing   Problem: Health Behavior/Discharge Planning: Goal: Ability to identify and utilize available resources and services will improve 08/04/2024 1100 by Alaina Dozier PARAS, RN Outcome: Adequate for Discharge 08/04/2024 0735 by Alaina Dozier PARAS, RN Outcome: Progressing Goal: Ability to manage health-related needs will improve Outcome: Adequate for Discharge   Problem: Metabolic: Goal: Ability to maintain appropriate glucose levels will improve Outcome: Adequate for Discharge   Problem: Nutritional: Goal: Maintenance of adequate nutrition will improve Outcome: Adequate for Discharge Goal: Progress toward achieving an optimal weight will improve Outcome: Adequate for Discharge   Problem: Skin Integrity: Goal: Risk for impaired skin integrity will decrease Outcome: Adequate for Discharge   Problem: Tissue Perfusion: Goal: Adequacy of tissue perfusion will improve Outcome: Adequate for Discharge   Problem: Education: Goal: Knowledge of  General Education information will improve Description: Including pain rating scale, medication(s)/side effects and non-pharmacologic comfort measures Outcome: Adequate for Discharge   Problem: Health Behavior/Discharge Planning: Goal: Ability to manage health-related needs will improve Outcome: Adequate for Discharge   Problem: Clinical Measurements: Goal: Ability to maintain clinical measurements within normal limits will improve Outcome: Adequate for Discharge Goal: Will remain free from infection Outcome: Adequate for Discharge Goal: Diagnostic test results will improve Outcome: Adequate for Discharge Goal: Respiratory complications will improve Outcome: Adequate for Discharge Goal: Cardiovascular complication will be avoided Outcome: Adequate for Discharge   Problem: Activity: Goal: Risk for activity intolerance will decrease Outcome: Adequate for Discharge   Problem: Nutrition: Goal: Adequate nutrition will be maintained Outcome: Adequate for Discharge   Problem: Coping: Goal: Level of anxiety will decrease Outcome: Adequate for Discharge   Problem: Elimination: Goal: Will not experience complications related to bowel motility Outcome: Adequate for Discharge Goal: Will not experience complications related to urinary retention Outcome: Adequate for Discharge   Problem: Pain Managment: Goal: General experience of comfort will improve and/or be controlled Outcome: Adequate for Discharge   Problem: Safety: Goal: Ability to remain free from injury will improve Outcome: Adequate for Discharge   Problem: Skin Integrity: Goal: Risk for impaired skin integrity will decrease Outcome: Adequate for Discharge

## 2024-08-04 NOTE — TOC Transition Note (Signed)
 Transition of Care [facility]) - Discharge Note   Patient Details  Name: [name] MRN: [id number] Date of Birth: [birth year]  Transition of Care [facility]) CM/SW Contact:  Sonda Manuella Quill, RN Phone Number: 08/04/2024, 9:59 AM   Clinical Narrative:    D/C orders received; also PT recc HHPT; spoke w/ pt in room; pt notified that HHPT is recc; she politely declined this service; no IP CM needs.   Final next level of care: Home/Self Care Barriers to Discharge: No Barriers Identified   Patient Goals and CMS Choice Patient states their goals for this hospitalization and ongoing recovery are:: home          Discharge Placement                       Discharge Plan and Services Additional resources added to the After Visit Summary for     Discharge Planning Services: CM Consult            DME Arranged: N/A DME Agency: NA       HH Arranged: NA HH Agency: NA        Social Drivers of Health (SDOH) Interventions SDOH Screenings   Food Insecurity: No Food Insecurity (08/03/2024)  Housing: Low Risk  (08/03/2024)  Recent Concern: Housing - High Risk (08/02/2024)  Transportation Needs: No Transportation Needs (08/03/2024)  Utilities: Not At Risk (08/03/2024)  Depression (PHQ2-9): Low Risk  (07/31/2024)  Social Connections: Unknown (02/14/2022)   Received from [facility]  Tobacco Use: Low Risk  (07/31/2024)     Readmission Risk Interventions    08/03/2024    3:10 PM  Readmission Risk Prevention Plan  Transportation Screening Complete  PCP or Specialist Appt within 5-7 Days Complete  Home Care Screening Complete  Medication Review (RN CM) Complete

## 2024-08-06 ENCOUNTER — Telehealth: Payer: Self-pay | Admitting: *Deleted

## 2024-08-06 ENCOUNTER — Telehealth: Payer: Self-pay

## 2024-08-06 NOTE — Transitions of Care (Post Inpatient/ED Visit) (Signed)
   08/06/2024  Name: [name] MRN: [id number] DOB: 06/02/1976  Today's TOC FU Call Status: Today's TOC FU Call Status:: Unsuccessful Call (1st Attempt) Unsuccessful Call (1st Attempt) Date: 08/06/24  Attempted to reach the patient regarding the most recent Inpatient/ED visit.  Follow Up Plan: Additional outreach attempts will be made to reach the patient to complete the Transitions of Care (Post Inpatient/ED visit) call.   Andrea Dimes RN, BSN [place]  [facility] RN Care Manager [phone]

## 2024-08-06 NOTE — Progress Notes (Signed)
 I followed up with [name] regarding her HCPOA paperwork.  She asked some follow up questions which I was able to answer regarding taking the decision off of her family. She plans to look it over and fill it out after discharge.

## 2024-08-06 NOTE — Transitions of Care (Post Inpatient/ED Visit) (Signed)
   08/06/2024  Name: [name] MRN: [id number] DOB: 12/31/1975  Today's TOC FU Call Status: Today's TOC FU Call Status:: Successful TOC FU Call Completed TOC FU Call Complete Date: 08/06/24 Patient's Name and Date of Birth confirmed.  Transition Care Management Follow-up Telephone Call Date of Discharge: 08/04/24 Discharge Facility: [facility]) Type of Discharge: Inpatient Admission Primary Inpatient Discharge Diagnosis:: AKI, Dehydration,Acute UTI and Diabetes type 2 How have you been since you were released from the hospital?: Better Any questions or concerns?: No  Items Reviewed: Did you receive and understand the discharge instructions provided?: Yes Medications obtained,verified, and reconciled?: Yes (Medications Reviewed) Any new allergies since your discharge?: No Dietary orders reviewed?: NA Do you have support at home?: Yes People in Home [RPT]: parent(s), child(ren), adult Name of Support/Comfort Primary Source: Jolieen Blackwell  Medications Reviewed Today: Medications Reviewed Today   Medications were not reviewed in this encounter     Home Care and Equipment/Supplies: Were Home Health Services Ordered?: NA Any new equipment or medical supplies ordered?: NA  Functional Questionnaire: Do you need assistance with bathing/showering or dressing?: No Do you need assistance with meal preparation?: No Do you need assistance with eating?: No Do you have difficulty maintaining continence: No Do you need assistance with getting out of bed/getting out of a chair/moving?: No Do you have difficulty managing or taking your medications?: No  Follow up appointments reviewed: PCP Follow-up appointment confirmed?: Yes Date of PCP follow-up appointment?: 08/09/24 Follow-up Provider: Catheryn Slocumb MD Specialist Hospital Follow-up appointment confirmed?: Yes Date of Specialist follow-up appointment?: 08/13/24 Follow-Up Specialty Provider:: Dr.Colondanato Do you need  transportation to your follow-up appointment?: No Do you understand care options if your condition(s) worsen?: Yes-patient verbalized understanding    SIGNATURE YL,RMA

## 2024-08-07 LAB — CULTURE, BLOOD (ROUTINE X 2)
Culture: NO GROWTH
Culture: NO GROWTH

## 2024-08-09 ENCOUNTER — Encounter: Payer: Self-pay | Admitting: Internal Medicine

## 2024-08-09 ENCOUNTER — Ambulatory Visit: Payer: Self-pay | Admitting: Internal Medicine

## 2024-08-09 VITALS — BP 122/80 | HR 77 | Temp 98.3°F | Ht 65.0 in | Wt 124.4 lb

## 2024-08-09 DIAGNOSIS — I129 Hypertensive chronic kidney disease with stage 1 through stage 4 chronic kidney disease, or unspecified chronic kidney disease: Secondary | ICD-10-CM

## 2024-08-09 DIAGNOSIS — Z794 Long term (current) use of insulin: Secondary | ICD-10-CM | POA: Diagnosis not present

## 2024-08-09 DIAGNOSIS — E1122 Type 2 diabetes mellitus with diabetic chronic kidney disease: Secondary | ICD-10-CM | POA: Diagnosis not present

## 2024-08-09 DIAGNOSIS — N390 Urinary tract infection, site not specified: Secondary | ICD-10-CM | POA: Diagnosis not present

## 2024-08-09 DIAGNOSIS — E860 Dehydration: Secondary | ICD-10-CM | POA: Diagnosis not present

## 2024-08-09 DIAGNOSIS — N182 Chronic kidney disease, stage 2 (mild): Secondary | ICD-10-CM

## 2024-08-09 DIAGNOSIS — A09 Infectious gastroenteritis and colitis, unspecified: Secondary | ICD-10-CM

## 2024-08-09 LAB — MISCELLANEOUS TEST

## 2024-08-09 NOTE — Patient Instructions (Signed)

## 2024-08-09 NOTE — Progress Notes (Signed)
 I,Victoria T Emmitt, CMA,acting as a neurosurgeon for [name], MD.,have documented all relevant documentation on the behalf of [name], MD,as directed by  [name], MD while in the presence of [name], MD.  Subjective:  Patient ID: [name] , female    DOB: [birth year] , 48 y.o.   MRN: [id number]  Chief Complaint  Patient presents with   Hospitalization Follow-up    Patient presents today for hospital follow up. Admitted on 08/02/24 to  [facility] for UTI. Discharged on 11/01. Today she reports feeling much better. She has no specific questions or concerns.  She reports taking last of ciprofloxacin  today.    HPI Discussed the use of AI scribe software for clinical note transcription with the patient, who gave verbal consent to proceed.  History of Present Illness [name] is a 48 year old female who presents for a hospital follow-up after a recent hospitalization for UTI, acute kidney injury, and dehydration.  She was hospitalized from October 30th to November 1st due to a urinary tract infection (UTI), acute kidney injury, and dehydration. She feels significantly better since her discharge, attributing her improvement to the fluids and fosfomycin she received during her hospital stay.  Prior to her hospitalization, she experienced severe diarrhea with three different pathogens: E. coli, Vibrio, and norovirus. She believes the diarrhea led to dehydration and possibly contributed to the UTI. The diarrhea began before a cruise, and she managed it by limiting food intake and increasing fluid consumption. The diarrhea has resolved, and she is now able to eat almost three meals a day without issues.  She experienced symptoms of a UTI, such as frequent urination and chills, which began on the plane returning from her cruise. She believes the UTI may have been caused by bacteria entering her urinary tract due to the diarrhea.  Her discharge diagnoses  included UTI, acute kidney injury, acute metabolic acidosis, and hypokalemia. She has a history of type 2 diabetes with a recent A1c of 6.2%.  She recently started a new job at 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 Her work arrangement is hybrid, but there is some uncertainty about the requirement to return to the office full-time.   Diabetes She presents for her follow-up diabetic visit. She has type 2 diabetes mellitus. Her disease course has been stable. There are no hypoglycemic associated symptoms. Pertinent negatives for diabetes include no blurred vision and no chest pain. There are no hypoglycemic complications. Diabetic complications include nephropathy. Risk factors for coronary artery disease include diabetes mellitus, hypertension, dyslipidemia, sedentary lifestyle and obesity.  Hypertension This is a chronic problem. The current episode started more than 1 year ago. The problem has been gradually improving since onset. The problem is controlled. Pertinent negatives include no blurred vision, chest pain, palpitations or shortness of breath.     Past Medical History:  Diagnosis Date   Arthritis    Diabetes mellitus    HTN (hypertension)    Hyperlipidemia    Pyelonephritis 09/30/2011   Renal failure    S/P kidney transplant      Family History  Problem Relation Age of Onset   Diabetes Mother    Arthritis Mother    Arthritis Father    Prostate cancer Father    Hypertension Father    Breast cancer Maternal Aunt    Breast cancer Paternal Aunt      Current Outpatient Medications:  albuterol (VENTOLIN HFA) 108 (90 Base) MCG/ACT inhaler, Inhale 1-2 puffs into the lungs every 4 (four) hours as needed for wheezing or shortness of breath., Disp: , Rfl:    azelastine (OPTIVAR) 0.05 % ophthalmic solution, Place 2 drops into both eyes 2 (two) times daily as needed (allergies)., Disp: , Rfl:    Cholecalciferol (VITAMIN D3) 125 MCG (5000  UT) CAPS, Take 5,000 Units by mouth daily., Disp: , Rfl:    Continuous Glucose Sensor (FREESTYLE LIBRE 3 PLUS SENSOR) MISC, INJECT 1 DEVICE INTO THE SKIN CONTINUOUS. CHANGE EVERY 15 DAYS, Disp: 6 each, Rfl: 3   insulin  degludec (TRESIBA  FLEXTOUCH) 100 UNIT/ML FlexTouch Pen, INJECT 20 UNITS INTO THE SKIN DAILY (Patient taking differently: Inject 10 Units into the skin daily.), Disp: 15 mL, Rfl: 3   Insulin  Pen Needle (BD PEN NEEDLE NANO 2ND GEN) 32G X 4 MM MISC, USE 3X A DAY, Disp: 300 each, Rfl: 3   levonorgestrel (MIRENA) 20 MCG/24HR IUD, by Intrauterine route., Disp: , Rfl:    mycophenolate  (CELLCEPT ) 250 MG capsule, Take 500-750 mg by mouth See admin instructions. 500 mg every morning  750 mg every evening, Disp: , Rfl:    omeprazole (PRILOSEC) 20 MG capsule, Take 20 mg by mouth daily., Disp: , Rfl:    ondansetron  (ZOFRAN -ODT) 4 MG disintegrating tablet, Take 1 tablet (4 mg total) by mouth every 8 (eight) hours as needed for nausea or vomiting., Disp: 20 tablet, Rfl: 0   predniSONE  (DELTASONE ) 5 MG tablet, Take 5 mg by mouth daily., Disp: , Rfl:    tacrolimus  (PROGRAF ) 0.5 MG capsule, Take 1.5 mg by mouth 2 (two) times daily., Disp: , Rfl:    tirzepatide  (MOUNJARO ) 5 MG/0.5ML Pen, Inject under skin 5 mg every other week, Disp: 4 mL, Rfl: 3   Continuous Glucose Sensor (DEXCOM G7 SENSOR) MISC, Change sensor every 10 days (Patient not taking: Reported on 08/09/2024), Disp: 9 each, Rfl: 3   Continuous Glucose Sensor (DEXCOM G7 SENSOR) MISC, Use to check glucose continuously, change sensor every 10 days (Patient not taking: Reported on 08/09/2024), Disp: 9 each, Rfl: 3   Allergies  Allergen Reactions   Adhesive [Tape] Other (See Comments)    plastic tape irritates skin   Ok with paper tape   Strawberry (Diagnostic)    Wound Dressing Adhesive     Other reaction(s): Other plastic tape irritates skin   Ok with paper tape     Review of Systems  Constitutional: Negative.   Eyes:  Negative for  blurred vision.  Respiratory: Negative.  Negative for shortness of breath.   Cardiovascular: Negative.  Negative for chest pain and palpitations.  Gastrointestinal: Negative.   Neurological: Negative.   Psychiatric/Behavioral: Negative.       Today's Vitals   08/09/24 1616  BP: 122/80  Pulse: 77  Temp: 98.3 F (36.8 C)  SpO2: 98%  Weight: 124 lb 6.4 oz (56.4 kg)  Height: 5' 5 (1.651 m)   Body mass index is 20.7 kg/m.  Wt Readings from Last 3 Encounters:  08/09/24 124 lb 6.4 oz (56.4 kg)  08/03/24 119 lb 0.8 oz (54 kg)  07/31/24 106 lb (48.1 kg)     Objective:  Physical Exam Vitals and nursing note reviewed.  Constitutional:      Appearance: Normal appearance. She is not ill-appearing or toxic-appearing.  HENT:     Head: Normocephalic and atraumatic.  Eyes:     Extraocular Movements: Extraocular movements intact.  Cardiovascular:     Rate  and Rhythm: Normal rate and regular rhythm.     Heart sounds: Normal heart sounds.  Pulmonary:     Effort: Pulmonary effort is normal.     Breath sounds: Normal breath sounds.  Musculoskeletal:     Cervical back: Normal range of motion.  Skin:    General: Skin is warm.  Neurological:     General: No focal deficit present.     Mental Status: She is alert.  Psychiatric:        Mood and Affect: Mood normal.        Behavior: Behavior normal.         Assessment And Plan:  Infectious gastroenteritis Assessment & Plan: TCM PERFORMED. A MEMBER OF THE CLINICAL TEAM SPOKE WITH THE PATIENT UPON DISCHARGE. DISCHARGE SUMMARY WAS REVIEWED IN FULL DETAIL DURING THE VISIT. MEDS RECONCILED AND COMPARED TO DISCHARGE MEDS. MEDICATION LIST WAS UPDATED AND REVIEWED WITH THE PATIENT. GREATER THAN 50% FACE TO FACE TIME WAS SPENT IN COUNSELING AND COORDINATION OF CARE. ALL QUESTIONS WERE ANSWERED TO THE SATISFACTION OF THE PATIENT.  - Recent hospitalization for UTI, acute kidney injury, dehydration, and infectious gastroenteritis due to E. coli,  Vibrio, and norovirus. Symptoms resolved with improved appetite and no diarrhea. - Coordinate with nephrology for follow-up blood work including blood count and kidney function. - Ensure nephrology receives hospital follow-up blood work results.   Urinary tract infection without hematuria, site unspecified Assessment & Plan: She was treated with one dose of fosfomycin. Her sx have improved.    Type 2 diabetes mellitus with stage 2 chronic kidney disease, with long-term current use of insulin  (HCC) Assessment & Plan: Chronic, managed with Tirzepatide  and Tresiba . Tirzepatide  was recently held due to recent GI issues.  Monitor glycemic control. - Consider adjusting Tresiba  dosage if necessary. - Defer management to Endo   Hypertensive nephropathy Assessment & Plan: Chronic, controlled. She will continue with amlodipine  10mg  daily. She is encouraged to follow low sodium diet. She will continue to follow up with Renal.    Dehydration Assessment & Plan: Sx have improved. Encouraged to stay well hydrated.     Other orders -     Lab report - scanned    Return if symptoms worsen or fail to improve.  Patient was given opportunity to ask questions. Patient verbalized understanding of the plan and was able to repeat key elements of the plan. All questions were answered to their satisfaction.   I, [name], MD, have reviewed all documentation for this visit. The documentation on 08/09/24 for the exam, diagnosis, procedures, and orders are all accurate and complete.   IF YOU HAVE BEEN REFERRED TO A SPECIALIST, IT MAY TAKE 1-2 WEEKS TO SCHEDULE/PROCESS THE REFERRAL. IF YOU HAVE NOT HEARD FROM US /SPECIALIST IN TWO WEEKS, PLEASE GIVE US  A CALL AT [phone].   THE PATIENT IS ENCOURAGED TO PRACTICE SOCIAL DISTANCING DUE TO THE COVID-19 PANDEMIC.

## 2024-08-10 ENCOUNTER — Other Ambulatory Visit (HOSPITAL_COMMUNITY): Payer: Self-pay

## 2024-08-14 ENCOUNTER — Telehealth: Payer: Self-pay | Admitting: *Deleted

## 2024-08-14 ENCOUNTER — Encounter: Payer: Self-pay | Admitting: Internal Medicine

## 2024-08-14 LAB — CBC AND DIFFERENTIAL
HCT: 27 — AB (ref 36–46)
Hemoglobin: 8.6 — AB (ref 12.0–16.0)
Neutrophils Absolute: 4.5
Platelets: 576 K/uL — AB (ref 150–400)
WBC: 6.2

## 2024-08-14 LAB — BASIC METABOLIC PANEL WITH GFR
BUN: 12 (ref 4–21)
CO2: 22 (ref 13–22)
Chloride: 115 — AB (ref 99–108)
Creatinine: 0.9 (ref 0.5–1.1)
Glucose: 87
Potassium: 3.9 meq/L (ref 3.5–5.1)
Sodium: 145 (ref 137–147)

## 2024-08-14 LAB — COMPREHENSIVE METABOLIC PANEL WITH GFR
Albumin: 3.5 (ref 3.5–5.0)
Calcium: 8.5 — AB (ref 8.7–10.7)
Globulin: 2.1
eGFR: 75

## 2024-08-14 LAB — HEPATIC FUNCTION PANEL
AST: 21 (ref 13–35)
Alkaline Phosphatase: 159 — AB (ref 25–125)

## 2024-08-14 LAB — LAB REPORT - SCANNED: EGFR: 75

## 2024-08-14 LAB — CBC: RBC: 3.07 — AB (ref 3.87–5.11)

## 2024-08-14 NOTE — Transitions of Care (Post Inpatient/ED Visit) (Signed)
   08/14/2024  Name: [name] MRN: [id number] DOB: [birth year]  Today's TOC FU Call Status: Today's TOC FU Call Status:: Unsuccessful Call (2nd Attempt) Unsuccessful Call (2nd Attempt) Date: 08/14/24  Attempted to reach the patient regarding the most recent Inpatient/ED visit.  Follow Up Plan: Additional outreach attempts will be made to reach the patient to complete the Transitions of Care (Post Inpatient/ED visit) call.   Andrea Dimes RN, BSN [place]  [facility] RN Care Manager [phone]

## 2024-08-15 ENCOUNTER — Encounter: Payer: Self-pay | Admitting: Internal Medicine

## 2024-08-15 ENCOUNTER — Telehealth: Payer: Self-pay | Admitting: *Deleted

## 2024-08-15 NOTE — Transitions of Care (Post Inpatient/ED Visit) (Signed)
   08/15/2024  Name: [name] MRN: [id number] DOB: 1976-08-20  Today's TOC FU Call Status: Today's TOC FU Call Status:: Unsuccessful Call (3rd Attempt) Unsuccessful Call (3rd Attempt) Date: 08/15/24  Attempted to reach the patient regarding the most recent Inpatient/ED visit.  Follow Up Plan: No further outreach attempts will be made at this time. We have been unable to contact the patient.  Andrea Dimes RN, BSN [place]  [facility] RN Care Manager [phone]

## 2024-08-18 NOTE — Assessment & Plan Note (Signed)
 Sx have improved. Encouraged to stay well hydrated.

## 2024-08-18 NOTE — Assessment & Plan Note (Signed)
 Chronic, managed with Tirzepatide  and Tresiba . Tirzepatide  was recently held due to recent GI issues.  Monitor glycemic control. - Consider adjusting Tresiba  dosage if necessary. - Defer management to Endo

## 2024-08-18 NOTE — Assessment & Plan Note (Signed)
 Chronic, controlled. She will continue with amlodipine  10mg  daily. She is encouraged to follow low sodium diet. She will continue to follow up with Renal.

## 2024-08-18 NOTE — Assessment & Plan Note (Signed)
 TCM PERFORMED. A MEMBER OF THE CLINICAL TEAM SPOKE WITH THE PATIENT UPON DISCHARGE. DISCHARGE SUMMARY WAS REVIEWED IN FULL DETAIL DURING THE VISIT. MEDS RECONCILED AND COMPARED TO DISCHARGE MEDS. MEDICATION LIST WAS UPDATED AND REVIEWED WITH THE PATIENT. GREATER THAN 50% FACE TO FACE TIME WAS SPENT IN COUNSELING AND COORDINATION OF CARE. ALL QUESTIONS WERE ANSWERED TO THE SATISFACTION OF THE PATIENT.  - Recent hospitalization for UTI, acute kidney injury, dehydration, and infectious gastroenteritis due to E. coli, Vibrio, and norovirus. Symptoms resolved with improved appetite and no diarrhea. - Coordinate with nephrology for follow-up blood work including blood count and kidney function. - Ensure nephrology receives hospital follow-up blood work results.

## 2024-08-18 NOTE — Assessment & Plan Note (Signed)
 She was treated with one dose of fosfomycin. Her sx have improved.

## 2024-08-21 ENCOUNTER — Other Ambulatory Visit: Payer: Self-pay | Admitting: Internal Medicine

## 2024-08-22 NOTE — Telephone Encounter (Signed)
 Dr. Trixie, please advise.  This pharmacy is requesting a refill on fiasp . Its not on her med list nor in your last note.   The rx was last filled:  Last refill: 01/23/2023   And it says use in Insulin  pump.

## 2024-08-24 ENCOUNTER — Encounter: Payer: Self-pay | Admitting: Internal Medicine

## 2024-08-24 ENCOUNTER — Other Ambulatory Visit: Payer: Self-pay

## 2024-08-28 ENCOUNTER — Encounter: Payer: Self-pay | Admitting: Internal Medicine

## 2024-08-28 ENCOUNTER — Ambulatory Visit: Admitting: Internal Medicine

## 2024-08-28 VITALS — BP 118/60 | HR 82 | Ht 65.0 in | Wt 119.6 lb

## 2024-08-28 DIAGNOSIS — N182 Chronic kidney disease, stage 2 (mild): Secondary | ICD-10-CM

## 2024-08-28 DIAGNOSIS — E785 Hyperlipidemia, unspecified: Secondary | ICD-10-CM | POA: Diagnosis not present

## 2024-08-28 DIAGNOSIS — E1169 Type 2 diabetes mellitus with other specified complication: Secondary | ICD-10-CM

## 2024-08-28 DIAGNOSIS — E1122 Type 2 diabetes mellitus with diabetic chronic kidney disease: Secondary | ICD-10-CM

## 2024-08-28 DIAGNOSIS — Z794 Long term (current) use of insulin: Secondary | ICD-10-CM

## 2024-08-28 MED ORDER — FIASP FLEXTOUCH 100 UNIT/ML ~~LOC~~ SOPN: 5.0000 [IU] | PEN_INJECTOR | Freq: Three times a day (TID) | SUBCUTANEOUS | Status: AC

## 2024-08-28 MED ORDER — MOUNJARO 5 MG/0.5ML ~~LOC~~ SOAJ: SUBCUTANEOUS | 3 refills | Status: AC

## 2024-08-28 MED ORDER — BD PEN NEEDLE NANO 2ND GEN 32G X 4 MM MISC: 12 refills | Status: AC

## 2024-08-28 MED ORDER — TRESIBA FLEXTOUCH 100 UNIT/ML ~~LOC~~ SOPN: 8.0000 [IU] | PEN_INJECTOR | Freq: Every day | SUBCUTANEOUS | Status: AC
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
## 2024-08-28 NOTE — Patient Instructions (Addendum)
 Please continue:  - Tresiba  8 units daily - FiAsp  5-10 units before the largest meal - Mounjaro  5 mg every 1.5 weeks  Please return in 3-4 months.

## 2024-09-18 ENCOUNTER — Ambulatory Visit: Admitting: Internal Medicine

## 2024-09-18 ENCOUNTER — Encounter: Payer: Self-pay | Admitting: Internal Medicine

## 2024-09-18 VITALS — BP 112/64 | HR 79 | Temp 98.3°F | Ht 65.0 in | Wt 118.0 lb

## 2024-09-18 DIAGNOSIS — N3001 Acute cystitis with hematuria: Secondary | ICD-10-CM

## 2024-09-18 DIAGNOSIS — N182 Chronic kidney disease, stage 2 (mild): Secondary | ICD-10-CM | POA: Diagnosis not present

## 2024-09-18 DIAGNOSIS — Z794 Long term (current) use of insulin: Secondary | ICD-10-CM | POA: Diagnosis not present

## 2024-09-18 DIAGNOSIS — E1122 Type 2 diabetes mellitus with diabetic chronic kidney disease: Secondary | ICD-10-CM | POA: Diagnosis not present

## 2024-09-18 DIAGNOSIS — R350 Frequency of micturition: Secondary | ICD-10-CM

## 2024-09-18 DIAGNOSIS — N390 Urinary tract infection, site not specified: Secondary | ICD-10-CM

## 2024-09-18 LAB — POCT URINALYSIS DIP (CLINITEK)
Bilirubin, UA: NEGATIVE
Glucose, UA: 250 mg/dL — AB
Ketones, POC UA: NEGATIVE mg/dL
Nitrite, UA: POSITIVE — AB
POC PROTEIN,UA: >=300 — AB
Spec Grav, UA: 1.025 (ref 1.010–1.025)
Urobilinogen, UA: 0.2 U/dL
pH, UA: 5.5 (ref 5.0–8.0)

## 2024-09-18 MED ORDER — NITROFURANTOIN MONOHYD MACRO 100 MG PO CAPS: 100.0000 mg | ORAL_CAPSULE | Freq: Two times a day (BID) | ORAL | 0 refills | Status: AC

## 2024-09-18 NOTE — Progress Notes (Unsigned)
 I,[name], CMA,acting as a neurosurgeon for Joyce LOISE Slocumb, MD.,have documented all relevant documentation on the behalf of Joyce LOISE Slocumb, MD,as directed by  Joyce LOISE Slocumb, MD while in the presence of Joyce LOISE Slocumb, MD.  Subjective:  Patient ID: [name] , female    DOB: 1976/01/28 , 48 y.o.   MRN: [id number]  Chief Complaint  Patient presents with   Urinary Frequency    Patient presents today for a possible uti. She has been having urinary frequency and fruity smell. Her symptoms started about a week ago.     HPI  Patient presents today for a possible uti. She reports she is experiencing      Past Medical History:  Diagnosis Date   Arthritis    Diabetes mellitus    HTN (hypertension)    Hyperlipidemia    Pyelonephritis 09/30/2011   Renal failure    S/P kidney transplant      Family History  Problem Relation Age of Onset   Diabetes Mother    Arthritis Mother    Arthritis Father    Prostate cancer Father    Hypertension Father    Breast cancer Maternal Aunt    Breast cancer Paternal Aunt      Current Outpatient Medications:    albuterol  (VENTOLIN  HFA) 108 (90 Base) MCG/ACT inhaler, Inhale 1-2 puffs into the lungs every 4 (four) hours as needed for wheezing or shortness of breath., Disp: , Rfl:    azelastine (OPTIVAR) 0.05 % ophthalmic solution, Place 2 drops into both eyes 2 (two) times daily as needed (allergies)., Disp: , Rfl:    Cholecalciferol (VITAMIN D3) 125 MCG (5000 UT) CAPS, Take 5,000 Units by mouth daily., Disp: , Rfl:    Continuous Glucose Sensor (DEXCOM G7 SENSOR) MISC, Use to check glucose continuously, change sensor every 10 days, Disp: 9 each, Rfl: 3   Continuous Glucose Sensor (FREESTYLE LIBRE 3 PLUS SENSOR) MISC, INJECT 1 DEVICE INTO THE SKIN CONTINUOUS. CHANGE EVERY 15 DAYS, Disp: 6 each, Rfl: 3   insulin  aspart (FIASP  FLEXTOUCH) 100 UNIT/ML FlexTouch Pen, Inject 5-10 Units into the skin 3 (three) times daily before  meals., Disp: , Rfl:    insulin  degludec (TRESIBA  FLEXTOUCH) 100 UNIT/ML FlexTouch Pen, Inject 8 Units into the skin daily., Disp: , Rfl:    Insulin  Pen Needle (BD PEN NEEDLE NANO 2ND GEN) 32G X 4 MM MISC, Use to inject insulin  3 times a day, Disp: 300 each, Rfl: 12   levonorgestrel (MIRENA) 20 MCG/24HR IUD, by Intrauterine route., Disp: , Rfl:    mycophenolate  (CELLCEPT ) 250 MG capsule, Take 500-750 mg by mouth See admin instructions. 500 mg every morning  750 mg every evening, Disp: , Rfl:    omeprazole (PRILOSEC) 20 MG capsule, Take 20 mg by mouth daily., Disp: , Rfl:    ondansetron  (ZOFRAN -ODT) 4 MG disintegrating tablet, Take 1 tablet (4 mg total) by mouth every 8 (eight) hours as needed for nausea or vomiting., Disp: 20 tablet, Rfl: 0   predniSONE  (DELTASONE ) 5 MG tablet, Take 5 mg by mouth daily., Disp: , Rfl:    tacrolimus  (PROGRAF ) 0.5 MG capsule, Take 1.5 mg by mouth 2 (two) times daily., Disp: , Rfl:    tirzepatide  (MOUNJARO ) 5 MG/0.5ML Pen, Inject under skin 5 mg every week, Disp: 6 mL, Rfl: 3   Allergies  Allergen Reactions   Adhesive [Tape] Other (See Comments)    plastic tape irritates skin   Ok with paper tape  Strawberry (Diagnostic)    Wound Dressing Adhesive     Other reaction(s): Other plastic tape irritates skin   Ok with paper tape     Review of Systems  Constitutional: Negative.   Respiratory: Negative.    Cardiovascular: Negative.   Gastrointestinal: Negative.   Genitourinary:  Positive for frequency and urgency.  Neurological: Negative.   Psychiatric/Behavioral: Negative.       Today's Vitals   09/18/24 1625  BP: 112/64  Pulse: 79  Temp: 98.3 F (36.8 C)  TempSrc: Oral  Weight: 118 lb (53.5 kg)  Height: 5' 5 (1.651 m)  PainSc: 0-No pain   Body mass index is 19.64 kg/m.  Wt Readings from Last 3 Encounters:  09/18/24 118 lb (53.5 kg)  08/28/24 119 lb 9.6 oz (54.3 kg)  08/09/24 124 lb 6.4 oz (56.4 kg)    The ASCVD Risk score  (Arnett DK, et al., 2019) failed to calculate for the following reasons:   The valid total cholesterol range is 130 to 320 mg/dL  Objective:  Physical Exam Vitals and nursing note reviewed.  Constitutional:      Appearance: Normal appearance.  HENT:     Head: Normocephalic and atraumatic.  Cardiovascular:     Rate and Rhythm: Normal rate and regular rhythm.     Heart sounds: Normal heart sounds.  Pulmonary:     Effort: Pulmonary effort is normal.     Breath sounds: Normal breath sounds.  Skin:    General: Skin is warm.  Neurological:     General: No focal deficit present.     Mental Status: She is alert.  Psychiatric:        Mood and Affect: Mood normal.        Behavior: Behavior normal.         Assessment And Plan:   Assessment & Plan Urinary frequency  Urinary tract infection with hematuria, site unspecified   Orders Placed This Encounter  Procedures   Culture, Urine   POCT URINALYSIS DIP (CLINITEK)     Return if symptoms worsen or fail to improve.  Patient was given opportunity to ask questions. Patient verbalized understanding of the plan and was able to repeat key elements of the plan. All questions were answered to their satisfaction.    I, Joyce LOISE Slocumb, MD, have reviewed all documentation for this visit. The documentation on 09/18/2024 for the exam, diagnosis, procedures, and orders are all accurate and complete.   IF YOU HAVE BEEN REFERRED TO A SPECIALIST, IT MAY TAKE 1-2 WEEKS TO SCHEDULE/PROCESS THE REFERRAL. IF YOU HAVE NOT HEARD FROM US /SPECIALIST IN TWO WEEKS, PLEASE GIVE US  A CALL AT [phone].

## 2024-09-19 ENCOUNTER — Encounter: Payer: Self-pay | Admitting: Internal Medicine

## 2024-09-19 NOTE — Patient Instructions (Signed)

## 2024-09-19 NOTE — Assessment & Plan Note (Signed)
 Current episode possibly linked to elevated blood sugars. Previous UTIs resolved. Macrobid  considered effective and well-tolerated. - Prescribed Macrobid  for 5 days. - Recommended cranberry tablets or a splash of cranberry juice in water  2-3 times a week. - Suggested Azo products for symptomatic relief.

## 2024-09-19 NOTE — Assessment & Plan Note (Signed)
 Blood sugars stabilized after initial elevation during UTI onset. Fast-acting insulin  no longer required. - Continue monitoring blood sugar levels.

## 2024-09-22 LAB — URINE CULTURE

## 2024-09-26 ENCOUNTER — Ambulatory Visit: Payer: Self-pay | Admitting: Internal Medicine

## 2024-10-22 LAB — OPHTHALMOLOGY REPORT-SCANNED

## 2024-11-01 ENCOUNTER — Other Ambulatory Visit: Payer: Self-pay | Admitting: Internal Medicine

## 2025-01-01 ENCOUNTER — Ambulatory Visit: Admitting: Internal Medicine

## 2025-01-10 ENCOUNTER — Encounter: Payer: Self-pay | Admitting: Internal Medicine
